# Patient Record
Sex: Male | Born: 1935 | Race: White | Hispanic: No | Smoking: Former smoker
Health system: Southern US, Community
[De-identification: ages and names within clinical notes are randomized; demographics above are authoritative.]

## PROBLEM LIST (undated history)

## (undated) DIAGNOSIS — I4891 Unspecified atrial fibrillation: Secondary | ICD-10-CM

## (undated) DIAGNOSIS — E1129 Type 2 diabetes mellitus with other diabetic kidney complication: Secondary | ICD-10-CM

## (undated) DIAGNOSIS — I10 Essential (primary) hypertension: Secondary | ICD-10-CM

## (undated) DIAGNOSIS — N183 Chronic kidney disease, stage 3 unspecified: Secondary | ICD-10-CM

## (undated) HISTORY — PX: REPLACEMENT TOTAL KNEE: SUR1224

## (undated) HISTORY — PX: APPENDECTOMY: SHX54

---

## 2003-09-02 ENCOUNTER — Other Ambulatory Visit: Payer: Self-pay

## 2005-11-16 ENCOUNTER — Ambulatory Visit: Payer: Self-pay | Admitting: Ophthalmology

## 2005-11-22 ENCOUNTER — Ambulatory Visit: Payer: Self-pay | Admitting: Ophthalmology

## 2005-12-29 ENCOUNTER — Ambulatory Visit: Payer: Self-pay | Admitting: Internal Medicine

## 2006-01-11 ENCOUNTER — Emergency Department: Payer: Self-pay | Admitting: Emergency Medicine

## 2006-01-11 ENCOUNTER — Other Ambulatory Visit: Payer: Self-pay

## 2006-01-15 ENCOUNTER — Ambulatory Visit: Payer: Self-pay | Admitting: Internal Medicine

## 2006-02-05 ENCOUNTER — Ambulatory Visit: Payer: Self-pay | Admitting: Internal Medicine

## 2010-06-13 ENCOUNTER — Encounter: Payer: Self-pay | Admitting: Orthopedic Surgery

## 2010-07-07 ENCOUNTER — Encounter: Payer: Self-pay | Admitting: Orthopedic Surgery

## 2011-06-21 ENCOUNTER — Ambulatory Visit: Payer: Self-pay | Admitting: Ophthalmology

## 2013-06-02 ENCOUNTER — Ambulatory Visit: Payer: Self-pay | Admitting: Internal Medicine

## 2019-05-12 ENCOUNTER — Inpatient Hospital Stay
Admission: EM | Admit: 2019-05-12 | Discharge: 2019-05-16 | DRG: 481 | Disposition: A | Discharge status: Skilled Nursing Facility | Payer: Medicare Other | Attending: Internal Medicine | Admitting: Internal Medicine

## 2019-05-12 ENCOUNTER — Other Ambulatory Visit: Payer: Self-pay

## 2019-05-12 ENCOUNTER — Emergency Department: Payer: Medicare Other

## 2019-05-12 ENCOUNTER — Encounter: Payer: Self-pay | Admitting: Internal Medicine

## 2019-05-12 DIAGNOSIS — M9712XA Periprosthetic fracture around internal prosthetic left knee joint, initial encounter: Secondary | ICD-10-CM | POA: Diagnosis present

## 2019-05-12 DIAGNOSIS — Z888 Allergy status to other drugs, medicaments and biological substances status: Secondary | ICD-10-CM

## 2019-05-12 DIAGNOSIS — I9589 Other hypotension: Secondary | ICD-10-CM | POA: Diagnosis not present

## 2019-05-12 DIAGNOSIS — E1121 Type 2 diabetes mellitus with diabetic nephropathy: Secondary | ICD-10-CM | POA: Diagnosis not present

## 2019-05-12 DIAGNOSIS — N1831 Chronic kidney disease, stage 3a: Secondary | ICD-10-CM | POA: Diagnosis present

## 2019-05-12 DIAGNOSIS — I10 Essential (primary) hypertension: Secondary | ICD-10-CM | POA: Diagnosis not present

## 2019-05-12 DIAGNOSIS — Z882 Allergy status to sulfonamides status: Secondary | ICD-10-CM

## 2019-05-12 DIAGNOSIS — Y9389 Activity, other specified: Secondary | ICD-10-CM | POA: Diagnosis not present

## 2019-05-12 DIAGNOSIS — D62 Acute posthemorrhagic anemia: Secondary | ICD-10-CM | POA: Diagnosis not present

## 2019-05-12 DIAGNOSIS — S72452A Displaced supracondylar fracture without intracondylar extension of lower end of left femur, initial encounter for closed fracture: Secondary | ICD-10-CM | POA: Diagnosis present

## 2019-05-12 DIAGNOSIS — N179 Acute kidney failure, unspecified: Secondary | ICD-10-CM | POA: Diagnosis not present

## 2019-05-12 DIAGNOSIS — I4891 Unspecified atrial fibrillation: Secondary | ICD-10-CM | POA: Diagnosis present

## 2019-05-12 DIAGNOSIS — Z7982 Long term (current) use of aspirin: Secondary | ICD-10-CM | POA: Diagnosis not present

## 2019-05-12 DIAGNOSIS — S72402A Unspecified fracture of lower end of left femur, initial encounter for closed fracture: Secondary | ICD-10-CM | POA: Diagnosis present

## 2019-05-12 DIAGNOSIS — E1122 Type 2 diabetes mellitus with diabetic chronic kidney disease: Secondary | ICD-10-CM | POA: Diagnosis present

## 2019-05-12 DIAGNOSIS — Z6832 Body mass index (BMI) 32.0-32.9, adult: Secondary | ICD-10-CM

## 2019-05-12 DIAGNOSIS — I482 Chronic atrial fibrillation, unspecified: Secondary | ICD-10-CM | POA: Diagnosis present

## 2019-05-12 DIAGNOSIS — G4733 Obstructive sleep apnea (adult) (pediatric): Secondary | ICD-10-CM | POA: Diagnosis present

## 2019-05-12 DIAGNOSIS — Z8673 Personal history of transient ischemic attack (TIA), and cerebral infarction without residual deficits: Secondary | ICD-10-CM | POA: Diagnosis not present

## 2019-05-12 DIAGNOSIS — I5022 Chronic systolic (congestive) heart failure: Secondary | ICD-10-CM | POA: Diagnosis not present

## 2019-05-12 DIAGNOSIS — Z79899 Other long term (current) drug therapy: Secondary | ICD-10-CM | POA: Diagnosis not present

## 2019-05-12 DIAGNOSIS — X501XXA Overexertion from prolonged static or awkward postures, initial encounter: Secondary | ICD-10-CM | POA: Diagnosis not present

## 2019-05-12 DIAGNOSIS — S72402D Unspecified fracture of lower end of left femur, subsequent encounter for closed fracture with routine healing: Secondary | ICD-10-CM | POA: Diagnosis not present

## 2019-05-12 DIAGNOSIS — Z8571 Personal history of Hodgkin lymphoma: Secondary | ICD-10-CM

## 2019-05-12 DIAGNOSIS — Z419 Encounter for procedure for purposes other than remedying health state, unspecified: Secondary | ICD-10-CM

## 2019-05-12 DIAGNOSIS — Z794 Long term (current) use of insulin: Secondary | ICD-10-CM

## 2019-05-12 DIAGNOSIS — E1129 Type 2 diabetes mellitus with other diabetic kidney complication: Secondary | ICD-10-CM | POA: Diagnosis present

## 2019-05-12 DIAGNOSIS — Z87891 Personal history of nicotine dependence: Secondary | ICD-10-CM

## 2019-05-12 DIAGNOSIS — N183 Chronic kidney disease, stage 3 unspecified: Secondary | ICD-10-CM | POA: Diagnosis present

## 2019-05-12 DIAGNOSIS — I130 Hypertensive heart and chronic kidney disease with heart failure and stage 1 through stage 4 chronic kidney disease, or unspecified chronic kidney disease: Secondary | ICD-10-CM | POA: Diagnosis present

## 2019-05-12 DIAGNOSIS — E669 Obesity, unspecified: Secondary | ICD-10-CM | POA: Diagnosis present

## 2019-05-12 DIAGNOSIS — E861 Hypovolemia: Secondary | ICD-10-CM | POA: Diagnosis not present

## 2019-05-12 DIAGNOSIS — Z7901 Long term (current) use of anticoagulants: Secondary | ICD-10-CM

## 2019-05-12 DIAGNOSIS — D72829 Elevated white blood cell count, unspecified: Secondary | ICD-10-CM | POA: Diagnosis present

## 2019-05-12 DIAGNOSIS — Z20822 Contact with and (suspected) exposure to covid-19: Secondary | ICD-10-CM | POA: Diagnosis present

## 2019-05-12 DIAGNOSIS — I2510 Atherosclerotic heart disease of native coronary artery without angina pectoris: Secondary | ICD-10-CM | POA: Diagnosis present

## 2019-05-12 DIAGNOSIS — Y92012 Bathroom of single-family (private) house as the place of occurrence of the external cause: Secondary | ICD-10-CM

## 2019-05-12 DIAGNOSIS — Z955 Presence of coronary angioplasty implant and graft: Secondary | ICD-10-CM

## 2019-05-12 DIAGNOSIS — I4821 Permanent atrial fibrillation: Secondary | ICD-10-CM | POA: Diagnosis not present

## 2019-05-12 HISTORY — DX: Chronic kidney disease, stage 3 unspecified: N18.30

## 2019-05-12 HISTORY — DX: Unspecified atrial fibrillation: I48.91

## 2019-05-12 HISTORY — DX: Type 2 diabetes mellitus with other diabetic kidney complication: E11.29

## 2019-05-12 HISTORY — DX: Essential (primary) hypertension: I10

## 2019-05-12 LAB — COMPREHENSIVE METABOLIC PANEL
ALT: 19 U/L (ref 0–44)
AST: 28 U/L (ref 15–41)
Albumin: 3.5 g/dL (ref 3.5–5.0)
Alkaline Phosphatase: 63 U/L (ref 38–126)
Anion gap: 13 (ref 5–15)
BUN: 41 mg/dL — ABNORMAL HIGH (ref 8–23)
CO2: 20 mmol/L — ABNORMAL LOW (ref 22–32)
Calcium: 9.4 mg/dL (ref 8.9–10.3)
Chloride: 105 mmol/L (ref 98–111)
Creatinine, Ser: 1.53 mg/dL — ABNORMAL HIGH (ref 0.61–1.24)
GFR calc Af Amer: 47 mL/min — ABNORMAL LOW (ref >60)
GFR calc non Af Amer: 40 mL/min — ABNORMAL LOW (ref >60)
Glucose, Bld: 206 mg/dL — ABNORMAL HIGH (ref 70–99)
Potassium: 4.4 mmol/L (ref 3.5–5.1)
Sodium: 138 mmol/L (ref 135–145)
Total Bilirubin: 1.3 mg/dL — ABNORMAL HIGH (ref 0.3–1.2)
Total Protein: 7.2 g/dL (ref 6.5–8.1)

## 2019-05-12 LAB — TYPE AND SCREEN
ABO/RH(D): A POS
Antibody Screen: NEGATIVE

## 2019-05-12 LAB — CBC WITH DIFFERENTIAL/PLATELET
Abs Immature Granulocytes: 0.12 10*3/uL — ABNORMAL HIGH (ref 0.00–0.07)
Basophils Absolute: 0.1 10*3/uL (ref 0.0–0.1)
Basophils Relative: 1 %
Eosinophils Absolute: 0 10*3/uL (ref 0.0–0.5)
Eosinophils Relative: 0 %
HCT: 38.7 % — ABNORMAL LOW (ref 39.0–52.0)
Hemoglobin: 13.5 g/dL (ref 13.0–17.0)
Immature Granulocytes: 1 %
Lymphocytes Relative: 5 %
Lymphs Abs: 0.8 10*3/uL (ref 0.7–4.0)
MCH: 32.8 pg (ref 26.0–34.0)
MCHC: 34.9 g/dL (ref 30.0–36.0)
MCV: 93.9 fL (ref 80.0–100.0)
Monocytes Absolute: 0.9 10*3/uL (ref 0.1–1.0)
Monocytes Relative: 5 %
Neutro Abs: 15.5 10*3/uL — ABNORMAL HIGH (ref 1.7–7.7)
Neutrophils Relative %: 88 %
Platelets: 178 10*3/uL (ref 150–400)
RBC: 4.12 MIL/uL — ABNORMAL LOW (ref 4.22–5.81)
RDW: 12.8 % (ref 11.5–15.5)
WBC: 17.4 10*3/uL — ABNORMAL HIGH (ref 4.0–10.5)
nRBC: 0 % (ref 0.0–0.2)

## 2019-05-12 LAB — RESPIRATORY PANEL BY RT PCR (FLU A&B, COVID)
Influenza A by PCR: NEGATIVE
Influenza B by PCR: NEGATIVE
SARS Coronavirus 2 by RT PCR: NEGATIVE

## 2019-05-12 LAB — PROTIME-INR
INR: 1.4 — ABNORMAL HIGH (ref 0.8–1.2)
Prothrombin Time: 16.8 seconds — ABNORMAL HIGH (ref 11.4–15.2)

## 2019-05-12 LAB — GLUCOSE, CAPILLARY
Glucose-Capillary: 198 mg/dL — ABNORMAL HIGH (ref 70–99)
Glucose-Capillary: 233 mg/dL — ABNORMAL HIGH (ref 70–99)

## 2019-05-12 LAB — APTT: aPTT: 31 seconds (ref 24–36)

## 2019-05-12 LAB — BRAIN NATRIURETIC PEPTIDE: B Natriuretic Peptide: 107 pg/mL — ABNORMAL HIGH (ref 0.0–100.0)

## 2019-05-12 MED ORDER — CEFAZOLIN SODIUM-DEXTROSE 2-4 GM/100ML-% IV SOLN
2.0000 g | Freq: Once | INTRAVENOUS | Status: AC
  Administered 2019-05-13: 13:00:00 2 g via INTRAVENOUS
  Filled 2019-05-12: qty 100

## 2019-05-12 MED ORDER — INSULIN ASPART 100 UNIT/ML ~~LOC~~ SOLN
0.0000 [IU] | Freq: Every day | SUBCUTANEOUS | Status: DC
  Administered 2019-05-12: 2 [IU] via SUBCUTANEOUS
  Filled 2019-05-12: qty 1

## 2019-05-12 MED ORDER — SENNOSIDES-DOCUSATE SODIUM 8.6-50 MG PO TABS: 1.0000 | ORAL_TABLET | Freq: Every evening | ORAL | Status: DC | PRN

## 2019-05-12 MED ORDER — ACETAMINOPHEN 325 MG PO TABS
650.0000 mg | ORAL_TABLET | Freq: Four times a day (QID) | ORAL | Status: DC | PRN
  Administered 2019-05-12: 650 mg via ORAL
  Filled 2019-05-12 (×2): qty 2

## 2019-05-12 MED ORDER — MORPHINE SULFATE (PF) 2 MG/ML IV SOLN: 0.5000 mg | INTRAVENOUS | Status: DC | PRN

## 2019-05-12 MED ORDER — ONDANSETRON HCL 4 MG/2ML IJ SOLN: 4.0000 mg | Freq: Three times a day (TID) | INTRAMUSCULAR | Status: DC | PRN

## 2019-05-12 MED ORDER — INSULIN ASPART 100 UNIT/ML ~~LOC~~ SOLN
0.0000 [IU] | Freq: Three times a day (TID) | SUBCUTANEOUS | Status: DC
  Administered 2019-05-12: 3 [IU] via SUBCUTANEOUS
  Administered 2019-05-13: 5 [IU] via SUBCUTANEOUS
  Filled 2019-05-12 (×2): qty 1

## 2019-05-12 MED ORDER — OXYCODONE-ACETAMINOPHEN 5-325 MG PO TABS: 1.0000 | ORAL_TABLET | ORAL | Status: DC | PRN

## 2019-05-12 MED ORDER — HYDRALAZINE HCL 25 MG PO TABS: 25.0000 mg | ORAL_TABLET | Freq: Three times a day (TID) | ORAL | Status: DC | PRN

## 2019-05-12 MED ORDER — METHOCARBAMOL 500 MG PO TABS
500.0000 mg | ORAL_TABLET | Freq: Three times a day (TID) | ORAL | Status: DC | PRN
  Filled 2019-05-12: qty 1

## 2019-05-12 NOTE — ED Provider Notes (Signed)
[facility] Emergency Department Provider Note  ____________________________________________   First MD Initiated Contact with Patient 05/12/19 [phone]     (approximate)  I have reviewed the triage vital signs and the nursing notes.   HISTORY  Chief Complaint Knee Pain   HPI Edward Strong is a 86 y.o. male presents to the ED via EMS with complaint of left leg pain.  Patient states he was unable to bear weight.  Patient states that he got his leg caught in a plastic chair and by the time he got his leg out from the plastic chair he was unable to bear weight.  Patient lives alone and normally uses a walker.  He denies any head injury or loss of consciousness.  Patient has had a total knee replacement in his left knee.  Currently he rates his pain as 4 out of 10.      Past Medical History:  Diagnosis Date  . Atrial fibrillation ([place])   . CKD (chronic kidney disease), stage IIIa   . HTN (hypertension)   . Type II diabetes mellitus with renal manifestations [facility])     Patient Active Problem List   Diagnosis Date Noted  . Closed fracture of left distal femur ([place]) 05/12/2019  . CAD (coronary artery disease) 05/12/2019  . Chronic systolic CHF (congestive heart failure) ([place]) 05/12/2019  . Type II diabetes mellitus with renal manifestations ([place])   . HTN (hypertension)   . CKD (chronic kidney disease), stage IIIa   . Atrial fibrillation ([place])     Prior to Admission medications   Medication Sig Start Date End Date Taking? Authorizing Provider  amLODipine (NORVASC) 10 MG tablet Take 10 mg by mouth daily.   Yes [provider]  apixaban (ELIQUIS) 2.5 MG TABS tablet Take 2.5 mg by mouth 2 (two) times daily.   Yes [provider]  aspirin 81 MG chewable tablet Chew 81 mg by mouth daily.   Yes [provider]  furosemide (LASIX) 40 MG tablet Take 40 mg by mouth.   Yes [provider]  insulin lispro (HUMALOG) 100 UNIT/ML  injection Inject into the skin 3 (three) times daily before meals.   Yes [provider]  insulin regular (NOVOLIN R) 100 units/mL injection Inject into the skin 3 (three) times daily before meals.   Yes [provider]    Allergies Bee venom, Ezetimibe, Fenofibrate micronized, Pravastatin, and Sulfa antibiotics  No family history on file.  Social History Social History   Tobacco Use  . Smoking status: Not on file  Substance Use Topics  . Alcohol use: Not on file  . Drug use: Not on file    Review of Systems Constitutional: No fever/chills Eyes: No visual changes. Cardiovascular: Denies chest pain.  Positive history hypertension, CAD and chronic A. fib. Respiratory: Denies shortness of breath. Gastrointestinal: No abdominal pain.  No nausea, no vomiting. Genitourinary: Negative for dysuria. Musculoskeletal: Positive for left knee pain. Skin: Negative for rash. Neurological: Negative for headaches, focal weakness or numbness. ____________________________________________   PHYSICAL EXAM:  VITAL SIGNS: ED Triage Vitals  Enc Vitals Group     BP 05/12/19 0843 113/88     Pulse Rate 05/12/19 0843 85     Resp 05/12/19 0843 20     Temp 05/12/19 0843 97.9 F (36.6 C)     Temp Source 05/12/19 0843 Oral     SpO2 05/12/19 0843 98 %     Weight 05/12/19 0844 230 lb (104.3 kg)  Height 05/12/19 0844 5\' 11"  (1.803 m)     Head Circumference --      Peak Flow --      Pain Score 05/12/19 0844 4     Pain Loc --      Pain Edu? --      Excl. in [place]? --    Constitutional: Alert and oriented. Well appearing and in no acute distress.  Answers questions appropriately. Eyes: Conjunctivae are normal.  Head: Atraumatic. Neck: No stridor.   Cardiovascular: Normal rate, regular rhythm. Grossly normal heart sounds.  Good peripheral circulation. Respiratory: Normal respiratory effort.  No retractions. Lungs CTAB. Gastrointestinal: Soft and nontender. No distention.  Bowel  sounds normoactive x4 quadrants. Musculoskeletal: Examination of the left knee there is no deformity appreciated however there is moderate soft tissue edema present.  Area anteriorly is moderately tender to palpation.  There is some superficial abrasions noted without any active bleeding or foreign body.  No point tenderness is appreciated on compression of the hips bilaterally and no point tenderness is elicited over the left hip area.  No tenderness is noted on palpation of the left anterior tib-fib or ankle area.  Patient is able move upper extremities with any difficulty. Neurologic:  Normal speech and language. No gross focal neurologic deficits are appreciated. No gait instability. Skin:  Skin is warm, dry.  There are superficial abrasions on the anterior portion of the left knee without active bleeding or foreign body. Psychiatric: Mood and affect are normal. Speech and behavior are normal.  ____________________________________________   LABS (all labs ordered are listed, but only abnormal results are displayed)  Labs Reviewed  GLUCOSE, CAPILLARY - Abnormal; Notable for the following components:      Result Value   Glucose-Capillary 198 (*)    All other components within normal limits  RESPIRATORY PANEL BY RT PCR (FLU A&B, COVID)  CBC WITH DIFFERENTIAL/PLATELET  COMPREHENSIVE METABOLIC PANEL  APTT  PROTIME-INR  BRAIN NATRIURETIC PEPTIDE  HEMOGLOBIN A1C  TYPE AND SCREEN   ____________________________________________  EKG  ____________________________________________  RADIOLOGY  Official radiology report(s): DG Chest Portable 1 View  Result Date: 05/12/2019 CLINICAL DATA:  Preoperative examination for patient with a distal left femur fracture suffered in a fall today. EXAM: PORTABLE CHEST 1 VIEW COMPARISON:  None. FINDINGS: There is volume loss in the right chest with suture material present. Calcified granulomata are seen in the right lung and a calcified right hilar lymph  node is also noted. Lungs are otherwise clear. No pneumothorax or pleural effusion. Heart size is normal. Atherosclerosis is seen. IMPRESSION: No acute disease. Atherosclerosis. Electronically Signed   By: Inge Rise M.D.   On: 05/12/2019 12:03   DG Knee Complete 4 Views Left  Result Date: 05/12/2019 CLINICAL DATA:  Left knee pain after falling at home today. EXAM: LEFT KNEE - COMPLETE 4+ VIEW COMPARISON:  None. FINDINGS: Status post total knee arthroplasty. There is a comminuted and moderately displaced fracture of the distal femur just proximal to the femoral component of the arthroplasty. This fracture demonstrates up to 2.1 cm of posterior and 4.3 cm of proximal displacement. No dislocation. The proximal tibia and fibula are intact. Diffuse vascular calcifications are noted. IMPRESSION: Comminuted and moderately displaced fracture of the distal femur just proximal to the femoral component of the total knee arthroplasty. Electronically Signed   By: Richardean Sale M.D.   On: 05/12/2019 10:45    ____________________________________________   PROCEDURES  Procedure(s) performed (including Critical Care):  Procedures  ____________________________________________   INITIAL IMPRESSION / ASSESSMENT AND PLAN / ED COURSE  As part of my medical decision making, I reviewed the following data within the electronic MEDICAL RECORD NUMBER Notes from prior ED visits and [place] Controlled Substance Database  86 year old male presents to the ED via EMS after he got his left knee and lower extremity caught in a plastic chair that he uses while in the bathroom.  Patient normally uses a walker to ambulate and lives at home alone.  He states after getting his leg untangled he was unable to bear weight.  He denies a fall.  Patient had to be lifted with a hoist to the bed as he was unable to bear weight from the wheelchair to transfer.  X-rays show a displaced fracture of the distal femur.  Dr. Roland Rack was called and  arrangements were made for admission.  Admission was also discussed with Dr. Blaine Hamper.  Family member was informed that the patient was being admitted for surgery.  ____________________________________________   FINAL CLINICAL IMPRESSION(S) / ED DIAGNOSES  Final diagnoses:  Closed fracture of distal end of left femur, unspecified fracture morphology, initial encounter [facility])     ED Discharge Orders    None       Note:  This document was prepared using Dragon voice recognition software and may include unintentional dictation errors.    Johnn Hai, PA-C 05/12/19 1225    Arta Silence, MD 05/12/19 [phone]

## 2019-05-12 NOTE — ED Notes (Signed)
Attempted report and put on hold for 10 minutes with no answer

## 2019-05-12 NOTE — ED Notes (Signed)
Attempted report with no answer from floor

## 2019-05-12 NOTE — Consult Note (Signed)
CARDIOLOGY CONSULT NOTE               Patient ID: Edward Strong MRN: [id number] DOB/AGE: 1929-08-21 86 y.o.  Admit date: 05/12/2019 Referring Physician Dr Blaine Hamper hospitalist Primary Physician none Primary Cardiologist Dr. Nehemiah Massed Reason for Consultation preop clearance atrial fibrillation  HPI: 86 year old white male lives alone multiple medical problems status post fall preop for knee surgery.  Patient history of atrial fibrillation on Eliquis his last dose was 1030pm on January 3.  Patient has history of hypertension hyperlipidemia diabetes previous CVA coronary disease PCI and stents congestive heart failure Hodgkin's lymphoma obstructive sleep apnea chronic renal insufficiency.  Patient states to be doing reasonably well patient suffered a distal femur fracture comminuted preop for surgery with Dr. Roland Rack tomorrow.  Patient denies any chest pain shortness of breath blackout spells or syncope.  Patient feels reasonably well otherwise denies any palpitations tachycardia.  Review of systems complete and found to be negative unless listed above     Past Medical History:  Diagnosis Date  . Atrial fibrillation ([place])   . CKD (chronic kidney disease), stage IIIa   . HTN (hypertension)   . Type II diabetes mellitus with renal manifestations [facility])     Past Surgical History:  Procedure Laterality Date  . APPENDECTOMY    . REPLACEMENT TOTAL KNEE Left     Medications Prior to Admission  Medication Sig Dispense Refill Last Dose  . apixaban (ELIQUIS) 2.5 MG TABS tablet Take 2.5 mg by mouth 2 (two) times daily.   05/11/2019 at 2000  . aspirin 81 MG chewable tablet Chew 81 mg by mouth daily.   05/11/2019 at 0800  . furosemide (LASIX) 40 MG tablet Take 40 mg by mouth.   05/11/2019 at 0800  . insulin NPH Human (NOVOLIN N) 100 UNIT/ML injection Inject 50 Units into the skin every evening.   05/11/2019 at 2000  . insulin regular (NOVOLIN R) 100 units/mL injection Inject 20 Units into the skin 3 (three)  times daily before meals.   05/11/2019 at 1800  . losartan (COZAAR) 100 MG tablet Take 100 mg by mouth daily.   05/11/2019 at 0800   Social History   Socioeconomic History  . Marital status: Widowed    Spouse name: Not on file  . Number of children: Not on file  . Years of education: Not on file  . Highest education level: Not on file  Occupational History  . Not on file  Tobacco Use  . Smoking status: Former Research scientist (life sciences)  . Smokeless tobacco: Never Used  Substance and Sexual Activity  . Alcohol use: Never  . Drug use: Not Currently  . Sexual activity: Not on file  Other Topics Concern  . Not on file  Social History Narrative  . Not on file   Social Determinants of Health   Financial Resource Strain:   . Difficulty of Paying Living Expenses: Not on file  Food Insecurity:   . Worried About Charity fundraiser in the Last Year: Not on file  . Ran Out of Food in the Last Year: Not on file  Transportation Needs:   . Lack of Transportation (Medical): Not on file  . Lack of Transportation (Non-Medical): Not on file  Physical Activity:   . Days of Exercise per Week: Not on file  . Minutes of Exercise per Session: Not on file  Stress:   . Feeling of Stress : Not on file  Social Connections:   . Frequency of Communication  with Friends and Family: Not on file  . Frequency of Social Gatherings with Friends and Family: Not on file  . Attends Religious Services: Not on file  . Active Member of Clubs or Organizations: Not on file  . Attends Archivist Meetings: Not on file  . Marital Status: Not on file  Intimate Partner Violence:   . Fear of Current or Ex-Partner: Not on file  . Emotionally Abused: Not on file  . Physically Abused: Not on file  . Sexually Abused: Not on file    No family history on file.    Review of systems complete and found to be negative unless listed above      PHYSICAL EXAM  General: Well developed, well nourished, in no acute distress HEENT:   Normocephalic and atramatic Neck:  No JVD.  Lungs: Clear bilaterally to auscultation and percussion. Heart: Irregular irregular. Normal S1 and S2 without gallops or murmurs.  Abdomen: Bowel sounds are positive, abdomen soft and non-tender  Msk:  Back normal, normal gait. Normal strength and tone for age. Extremities: No clubbing, cyanosis or edema left knee immobilized.   Neuro: Alert and oriented X 3. Psych:  Good affect, responds appropriately  Labs:   Lab Results  Component Value Date   WBC 17.4 (H) 05/12/2019   HGB 13.5 05/12/2019   HCT 38.7 (L) 05/12/2019   MCV 93.9 05/12/2019   PLT 178 05/12/2019    Recent Labs  Lab 05/12/19 1222  NA 138  K 4.4  CL 105  CO2 20*  BUN 41*  CREATININE 1.53*  CALCIUM 9.4  PROT 7.2  BILITOT 1.3*  ALKPHOS 63  ALT 19  AST 28  GLUCOSE 206*   No results found for: CKTOTAL, CKMB, CKMBINDEX, TROPONINI No results found for: CHOL No results found for: HDL No results found for: LDLCALC No results found for: TRIG No results found for: CHOLHDL No results found for: LDLDIRECT    Radiology: DG Chest Portable 1 View  Result Date: 05/12/2019 CLINICAL DATA:  Preoperative examination for patient with a distal left femur fracture suffered in a fall today. EXAM: PORTABLE CHEST 1 VIEW COMPARISON:  None. FINDINGS: There is volume loss in the right chest with suture material present. Calcified granulomata are seen in the right lung and a calcified right hilar lymph node is also noted. Lungs are otherwise clear. No pneumothorax or pleural effusion. Heart size is normal. Atherosclerosis is seen. IMPRESSION: No acute disease. Atherosclerosis. Electronically Signed   By: Inge Rise M.D.   On: 05/12/2019 12:03   DG Knee Complete 4 Views Left  Result Date: 05/12/2019 CLINICAL DATA:  Left knee pain after falling at home today. EXAM: LEFT KNEE - COMPLETE 4+ VIEW COMPARISON:  None. FINDINGS: Status post total knee arthroplasty. There is a comminuted and  moderately displaced fracture of the distal femur just proximal to the femoral component of the arthroplasty. This fracture demonstrates up to 2.1 cm of posterior and 4.3 cm of proximal displacement. No dislocation. The proximal tibia and fibula are intact. Diffuse vascular calcifications are noted. IMPRESSION: Comminuted and moderately displaced fracture of the distal femur just proximal to the femoral component of the total knee arthroplasty. Electronically Signed   By: Richardean Sale M.D.   On: 05/12/2019 10:45    EKG: Atrial fibrillation rate of around 70 nonspecific ST-T wave changes  ASSESSMENT AND PLAN:  Preop for knee surgery Atrial fibrillation Hypertension Obstructive sleep apnea Diabetes Hyperlipidemia Coronary artery disease History of PCI and  stent Chronic renal insufficiency Elevated white count possibly related to CLL . Plan Patient appears to be an mild to moderate acceptable risk for orthopedic surgery Continue to hold Eliquis last dose was 22:30 May 11, 2019 Continue hypertension management control with amlodipine Agree with diabetes control Humalog Novolin insulin R Maintain adequate hydration I do not recommend any further testing to help mitigate his risk I will continue to follow while in hospital pre and postop  Signed: Yolonda Kida MD, PHD, [facility] 05/12/2019, 5:04 PM

## 2019-05-12 NOTE — H&P (Signed)
History and Physical    EBAN BONKOWSKI [id number] DOB: [birth year] DOA: 05/12/2019  Referring MD/NP/PA:   PCP: Patient, No Pcp Per   Patient coming from:  The patient is coming from home.  At baseline, pt is independent for most of ADL.        Chief Complaint: left knee pain  HPI: JAKWAN MINDEL is a 86 y.o. male with medical history significant of hypertension, hyperlipidemia, diabetes mellitus, stroke, CAD, stent placement, dCHF, Hodgkin's lymphoma, OSA, atrial fibrillation on Eliquis, CKD stage III, left knee replacement, who presents with left knee pain.  Pt states that he got his leg caught in a plastic chair, twisted his leg. By the time he got his leg out from the plastic chair he was unable to bear weight due to left knee pain. The pain is constant, moderate, sharp, nonradiating.  No fall. He denies any head injury or loss of consciousness.    Patient does not have chest pain, shortness breath, cough.  No nausea vomiting, diarrhea, abdominal pain, symptoms of UTI or unilateral weakness.  ED Course: pt was found to have WBC 17.4, renal function close to baseline temperature normal, blood pressure 113/88, heart rate 85, oxygen saturation 92% on room air, pending repeat for COVID-19 test.  X-ray of left knee showed comminuted displaced distal femur fracture.  Patient is admitted to [facility] bed as inpatient.  Dr. Roland Rack of ortho was consulted.   Review of Systems:   General: no fevers, chills, no body weight gain, has fatigue HEENT: no blurry vision, hearing changes or sore throat Respiratory: no dyspnea, coughing, wheezing CV: no chest pain, no palpitations GI: no nausea, vomiting, abdominal pain, diarrhea, constipation GU: no dysuria, burning on urination, increased urinary frequency, hematuria  Ext: no leg edema Neuro: no unilateral weakness, numbness, or tingling, no vision change or hearing loss Skin: no rash, no skin tear. MSK: left knee pain. Heme: No easy bruising.  Travel  history: No recent long distant travel.  Allergy:  Allergies  Allergen Reactions  . Bee Venom Other (See Comments)  . Ezetimibe Other (See Comments)  . Fenofibrate Micronized Other (See Comments)  . Pravastatin Other (See Comments)  . Sulfa Antibiotics Other (See Comments)    Past Medical History:  Diagnosis Date  . Atrial fibrillation ([place])   . CKD (chronic kidney disease), stage IIIa   . HTN (hypertension)   . Type II diabetes mellitus with renal manifestations [facility])     Past Surgical History:  Procedure Laterality Date  . APPENDECTOMY    . REPLACEMENT TOTAL KNEE Left     Social History:  reports that he has quit smoking. He has never used smokeless tobacco. He reports previous drug use. He reports that he does not drink alcohol.  Family History: No family history on file.  Tried to have reviewed with patient, patient cannot provide clear family medical history.  Prior to Admission medications   Medication Sig Start Date End Date Taking? Authorizing Provider  amLODipine (NORVASC) 10 MG tablet Take 10 mg by mouth daily.   Yes [provider]  apixaban (ELIQUIS) 2.5 MG TABS tablet Take 2.5 mg by mouth 2 (two) times daily.   Yes [provider]  aspirin 81 MG chewable tablet Chew 81 mg by mouth daily.   Yes [provider]  furosemide (LASIX) 40 MG tablet Take 40 mg by mouth.   Yes [provider]  insulin lispro (HUMALOG) 100 UNIT/ML injection Inject into the skin 3 (three)  times daily before meals.   Yes [provider]  insulin regular (NOVOLIN R) 100 units/mL injection Inject into the skin 3 (three) times daily before meals.   Yes [provider]    Physical Exam: Vitals:   05/12/19 [id number] 05/12/19 0844 05/12/19 1507 05/12/19 1718  BP: 113/88  120/80 119/79  Pulse: 85  80 82  Resp: 20  20   Temp: 97.9 F (36.6 C)   97.7 F (36.5 C)  TempSrc: Oral   Oral  SpO2: 98%  99% 96%  Weight:  104.3 kg    Height:  5\' 11"   (1.803 m)     General: Not in acute distress HEENT:       Eyes: PERRL, EOMI, no scleral icterus.       ENT: No discharge from the ears and nose, no pharynx injection, no tonsillar enlargement.        Neck: No JVD, no bruit, no mass felt. Heme: No neck lymph node enlargement. Cardiac: S1/S2, RRR, No murmurs, No gallops or rubs. Respiratory:  No rales, wheezing, rhonchi or rubs. GI: Soft, nondistended, nontender, no rebound pain, no organomegaly, BS present. GU: No hematuria Ext: No pitting leg edema bilaterally. 2+DP/PT pulse bilaterally. Musculoskeletal: has left knee tenderness. Skin: No rashes.  Neuro: Alert, oriented X3, cranial nerves II-XII grossly intact, moves all extremities. Psych: Patient is not psychotic, no suicidal or hemocidal ideation.  Labs on Admission: I have personally reviewed following labs and imaging studies  CBC: Recent Labs  Lab 05/12/19 1222  WBC 17.4*  NEUTROABS 15.5*  HGB 13.5  HCT 38.7*  MCV 93.9  PLT 0000000   Basic Metabolic Panel: Recent Labs  Lab 05/12/19 1222  NA 138  K 4.4  CL 105  CO2 20*  GLUCOSE 206*  BUN 41*  CREATININE 1.53*  CALCIUM 9.4   GFR: Estimated Creatinine Clearance: 41.8 mL/min (A) (by C-G formula based on SCr of 1.53 mg/dL (H)). Liver Function Tests: Recent Labs  Lab 05/12/19 1222  AST 28  ALT 19  ALKPHOS 63  BILITOT 1.3*  PROT 7.2  ALBUMIN 3.5   No results for input(s): LIPASE, AMYLASE in the last 168 hours. No results for input(s): AMMONIA in the last 168 hours. Coagulation Profile: Recent Labs  Lab 05/12/19 1222  INR 1.4*   Cardiac Enzymes: No results for input(s): CKTOTAL, CKMB, CKMBINDEX, TROPONINI in the last 168 hours. BNP (last 3 results) No results for input(s): PROBNP in the last 8760 hours. HbA1C: No results for input(s): HGBA1C in the last 72 hours. CBG: Recent Labs  Lab 05/12/19 1007  GLUCAP 198*   Lipid Profile: No results for input(s): CHOL, HDL, LDLCALC, TRIG, CHOLHDL,  LDLDIRECT in the last 72 hours. Thyroid Function Tests: No results for input(s): TSH, T4TOTAL, FREET4, T3FREE, THYROIDAB in the last 72 hours. Anemia Panel: No results for input(s): VITAMINB12, FOLATE, FERRITIN, TIBC, IRON, RETICCTPCT in the last 72 hours. Urine analysis: No results found for: COLORURINE, APPEARANCEUR, LABSPEC, PHURINE, GLUCOSEU, HGBUR, BILIRUBINUR, KETONESUR, PROTEINUR, UROBILINOGEN, NITRITE, LEUKOCYTESUR Sepsis Labs: @LABRCNTIP (procalcitonin:4,lacticidven:4) ) Recent Results (from the past 240 hour(s))  Respiratory Panel by RT PCR (Flu A&B, Covid) - Nasopharyngeal Swab     Status: None   Collection Time: 05/12/19 12:23 PM   Specimen: Nasopharyngeal Swab  Result Value Ref Range Status   SARS Coronavirus 2 by RT PCR NEGATIVE NEGATIVE Final    Comment: (NOTE) SARS-CoV-2 target nucleic acids are NOT DETECTED. The SARS-CoV-2 RNA is generally detectable in upper respiratoy specimens during  the acute phase of infection. The lowest concentration of SARS-CoV-2 viral copies this assay can detect is 131 copies/mL. A negative result does not preclude SARS-Cov-2 infection and should not be used as the sole basis for treatment or other patient management decisions. A negative result may occur with  improper specimen collection/handling, submission of specimen other than nasopharyngeal swab, presence of viral mutation(s) within the areas targeted by this assay, and inadequate number of viral copies (<131 copies/mL). A negative result must be combined with clinical observations, patient history, and epidemiological information. The expected result is Negative. Fact Sheet for Patients:  PinkCheek.be Fact Sheet for Healthcare Providers:  GravelBags.it This test is not yet ap proved or cleared by the [place] FDA and  has been authorized for detection and/or diagnosis of SARS-CoV-2 by FDA under an Emergency Use  Authorization (EUA). This EUA will remain  in effect (meaning this test can be used) for the duration of the COVID-19 declaration under Section 564(b)(1) of the Act, 21 U.S.C. section 360bbb-3(b)(1), unless the authorization is terminated or revoked sooner.    Influenza A by PCR NEGATIVE NEGATIVE Final   Influenza B by PCR NEGATIVE NEGATIVE Final    Comment: (NOTE) The Xpert Xpress SARS-CoV-2/FLU/RSV assay is intended as an aid in  the diagnosis of influenza from Nasopharyngeal swab specimens and  should not be used as a sole basis for treatment. Nasal washings and  aspirates are unacceptable for Xpert Xpress SARS-CoV-2/FLU/RSV  testing. Fact Sheet for Patients: PinkCheek.be Fact Sheet for Healthcare Providers: GravelBags.it This test is not yet approved or cleared by the [place] FDA and  has been authorized for detection and/or diagnosis of SARS-CoV-2 by  FDA under an Emergency Use Authorization (EUA). This EUA will remain  in effect (meaning this test can be used) for the duration of the  Covid-19 declaration under Section 564(b)(1) of the Act, 21  U.S.C. section 360bbb-3(b)(1), unless the authorization is  terminated or revoked. Performed at [facility], [address] [place], [place] [address]      Radiological Exams on Admission: DG Chest Portable 1 View  Result Date: 05/12/2019 CLINICAL DATA:  Preoperative examination for patient with a distal left femur fracture suffered in a fall today. EXAM: PORTABLE CHEST 1 VIEW COMPARISON:  None. FINDINGS: There is volume loss in the right chest with suture material present. Calcified granulomata are seen in the right lung and a calcified right hilar lymph node is also noted. Lungs are otherwise clear. No pneumothorax or pleural effusion. Heart size is normal. Atherosclerosis is seen. IMPRESSION: No acute disease. Atherosclerosis. Electronically Signed   By: Inge Rise M.D.   On: 05/12/2019 12:03   DG Knee Complete 4 Views Left  Result Date: 05/12/2019 CLINICAL DATA:  Left knee pain after falling at home today. EXAM: LEFT KNEE - COMPLETE 4+ VIEW COMPARISON:  None. FINDINGS: Status post total knee arthroplasty. There is a comminuted and moderately displaced fracture of the distal femur just proximal to the femoral component of the arthroplasty. This fracture demonstrates up to 2.1 cm of posterior and 4.3 cm of proximal displacement. No dislocation. The proximal tibia and fibula are intact. Diffuse vascular calcifications are noted. IMPRESSION: Comminuted and moderately displaced fracture of the distal femur just proximal to the femoral component of the total knee arthroplasty. Electronically Signed   By: Richardean Sale M.D.   On: 05/12/2019 10:45     EKG: Reviewed independently, atrial fibrillation, QTc 474, low voltage, LAD.   Assessment/Plan  Principal Problem:   Closed fracture of left distal femur (HCC) Active Problems:   Type II diabetes mellitus with renal manifestations (HCC)   HTN (hypertension)   CKD (chronic kidney disease), stage IIIa   Atrial fibrillation (HCC)   CAD (coronary artery disease)   Chronic systolic CHF (congestive heart failure) (HCC)   Leukocytosis   Closed fracture of left distal femur ([place]): As evidenced by x-ray. Patient has moderate pain now. No neurovascular compromise. Orthopedic surgeon, Dr. Roland Rack was consulted.   - will admit to Med-surg bed - Pain control: morphine prn and percocet - When necessary Zofran for nausea - Robaxin for muscle spasm - Appreciated Dr. consultation - type and cross - INR/PTT -PT/OT when able to (not ordered now) - message was sent to Dr. Clayborn Bigness of card for presurgical clearance given old age and significant cardiac history.  Leukocytosis: WBC 17.4  Likely due to stress-induced demargination. Patient does not have signs of infection. -Follow-up CBC  Type II diabetes mellitus  with renal manifestations [facility]): Last A1c 7.1, poorly controled. Patient is taking NPH insulin and Novolin at home -NPH insulin 30 units daily -SSI  HTN:  -Continue home medications: Cozaar and Lasix -hydralazine prn  CKD (chronic kidney disease), stage IIIa: Renal function close to baseline.  Baseline creatinine 1.3-1.5.  His creatinine is 1.53, BUN 41 -f/u by BMP  Atrial fibrillation [facility]): HR 85 -hold Eliquis for surgery -tele monitoring  CAD (coronary artery disease): s/p stent. No CP -on ASA  Chronic systolic CHF (congestive heart failure) ([place]): stable.  2D echo on 05/21/2015 showed a EF >55%. No Leg edema. -continue home lasix    DVT ppx:SCD Code Status: Full code Family Communication: None at bed side.   Disposition Plan:  Anticipate discharge back to previous home environment Consults called:  Dr. Roland Rack of ortho Admission status: Med-surg bed as inpt        Date of Service 05/12/2019    Ivor Costa [facility]   If 7PM-7AM, please contact night-coverage [email or web address] Password [email or web address] 05/12/2019, 5:59 PM

## 2019-05-12 NOTE — ED Notes (Signed)
See triage note   Presents via ems with left knee pain  Unable to bear wt  Denies any fall  States he went ot move his knee and felt pain

## 2019-05-12 NOTE — ED Triage Notes (Signed)
Pt from home via ems- states that his left leg got twisted up this am while going to the bathroom and since then has been having left knee pain, hx of replacement. Pt lives alone and usually ambulates with walker.

## 2019-05-12 NOTE — Consult Note (Signed)
ORTHOPAEDIC CONSULTATION  REQUESTING PHYSICIAN: Ivor Costa, MD  Chief Complaint:   Left knee pain.  History of Present Illness: Edward Strong is a 86 y.o. male with a history of type 2 diabetes, chronic renal insufficiency, chronic atrial fibrillation, and hypertension who lives independently, and ambulates with a walker.  Apparently, the patient was in his usual state of health this morning when he apparently got his foot caught up in the leg of a chair.  By the time he was able to disentangle his leg, he was unable to bear weight on the leg.  He was brought to the emergency room by EMS where x-rays of the left leg confirmed the presence of a supracondylar left distal femur fracture.  The patient is status post a left total knee arthroplasty which was performed at [facility] approximately 12 to 14 years ago.  The patient had been doing well following this procedure until his recent injury.  The patient denies any associated injury.  He did not strike his head or lose consciousness.  He also denies any lightheadedness, dizziness, chest pain, shortness of breath, or other symptoms which may have precipitated his fall.  Past Medical History:  Diagnosis Date  . Atrial fibrillation ([place])   . CKD (chronic kidney disease), stage IIIa   . HTN (hypertension)   . Type II diabetes mellitus with renal manifestations [facility])    Past Surgical History:  Procedure Laterality Date  . APPENDECTOMY    . REPLACEMENT TOTAL KNEE Left    Social History   Socioeconomic History  . Marital status: Widowed    Spouse name: Not on file  . Number of children: Not on file  . Years of education: Not on file  . Highest education level: Not on file  Occupational History  . Not on file  Tobacco Use  . Smoking status: Former Research scientist (life sciences)  . Smokeless tobacco: Never Used  Substance and Sexual Activity  . Alcohol use: Never  . Drug use: Not Currently  . Sexual  activity: Not on file  Other Topics Concern  . Not on file  Social History Narrative  . Not on file   Social Determinants of Health   Financial Resource Strain:   . Difficulty of Paying Living Expenses: Not on file  Food Insecurity:   . Worried About Charity fundraiser in the Last Year: Not on file  . Ran Out of Food in the Last Year: Not on file  Transportation Needs:   . Lack of Transportation (Medical): Not on file  . Lack of Transportation (Non-Medical): Not on file  Physical Activity:   . Days of Exercise per Week: Not on file  . Minutes of Exercise per Session: Not on file  Stress:   . Feeling of Stress : Not on file  Social Connections:   . Frequency of Communication with Friends and Family: Not on file  . Frequency of Social Gatherings with Friends and Family: Not on file  . Attends Religious Services: Not on file  . Active Member of Clubs or Organizations: Not on file  . Attends Archivist Meetings: Not on file  . Marital Status: Not on file   No family history on file. Allergies  Allergen Reactions  . Bee Venom Other (See Comments)  . Ezetimibe Other (See Comments)  . Fenofibrate Micronized Other (See Comments)  . Pravastatin Other (See Comments)  . Sulfa Antibiotics Other (See Comments)   Prior to Admission medications   Medication Sig Start  Date End Date Taking? Authorizing Provider  amLODipine (NORVASC) 10 MG tablet Take 10 mg by mouth daily.   Yes [provider]  apixaban (ELIQUIS) 2.5 MG TABS tablet Take 2.5 mg by mouth 2 (two) times daily.   Yes [provider]  aspirin 81 MG chewable tablet Chew 81 mg by mouth daily.   Yes [provider]  furosemide (LASIX) 40 MG tablet Take 40 mg by mouth.   Yes [provider]  insulin lispro (HUMALOG) 100 UNIT/ML injection Inject into the skin 3 (three) times daily before meals.   Yes [provider]  insulin regular (NOVOLIN R) 100 units/mL injection Inject  into the skin 3 (three) times daily before meals.   Yes [provider]   DG Chest Portable 1 View  Result Date: 05/12/2019 CLINICAL DATA:  Preoperative examination for patient with a distal left femur fracture suffered in a fall today. EXAM: PORTABLE CHEST 1 VIEW COMPARISON:  None. FINDINGS: There is volume loss in the right chest with suture material present. Calcified granulomata are seen in the right lung and a calcified right hilar lymph node is also noted. Lungs are otherwise clear. No pneumothorax or pleural effusion. Heart size is normal. Atherosclerosis is seen. IMPRESSION: No acute disease. Atherosclerosis. Electronically Signed   By: Inge Rise M.D.   On: 05/12/2019 12:03   DG Knee Complete 4 Views Left  Result Date: 05/12/2019 CLINICAL DATA:  Left knee pain after falling at home today. EXAM: LEFT KNEE - COMPLETE 4+ VIEW COMPARISON:  None. FINDINGS: Status post total knee arthroplasty. There is a comminuted and moderately displaced fracture of the distal femur just proximal to the femoral component of the arthroplasty. This fracture demonstrates up to 2.1 cm of posterior and 4.3 cm of proximal displacement. No dislocation. The proximal tibia and fibula are intact. Diffuse vascular calcifications are noted. IMPRESSION: Comminuted and moderately displaced fracture of the distal femur just proximal to the femoral component of the total knee arthroplasty. Electronically Signed   By: Richardean Sale M.D.   On: 05/12/2019 10:45    Positive ROS: All other systems have been reviewed and were otherwise negative with the exception of those mentioned in the HPI and as above.  Physical Exam: General:  Alert, no acute distress Psychiatric:  Patient is competent for consent with normal mood and affect   Cardiovascular:  No pedal edema Respiratory:  No wheezing, non-labored breathing GI:  Abdomen is soft and non-tender Skin:  No lesions in the area of chief complaint Neurologic:   Sensation intact distally Lymphatic:  No axillary or cervical lymphadenopathy  Orthopedic Exam:  Orthopedic examination is limited to the left knee and lower extremity.  There is a well-healed surgical incision over the anterior aspect of the knee which shows no evidence for infection.  Moderate swelling is noted around the knee area extending into the distal thigh region, but no erythema, ecchymosis, abrasions, or other skin abnormalities are identified.  He has tenderness to palpation over the distal thigh region, and has more severe pain with any attempted active or passive motion of the leg or knee.  He is able to actively dorsiflex and plantarflex his toes and ankle.  Sensations intact to light touch to all distributions.  He has good capillary refill to his left foot.  X-rays:  Recent x-rays of the left knee are available for review and have been reviewed by myself.  These films demonstrate a oblique supracondylar femur fracture beginning just proximal to  the femoral component of the total knee arthroplasty and and extending proximally and posteriorly.  The total knee components appear to be well-positioned and without evidence of loosening.  There does not appear to be any intra-articular extension.  Assessment: Closed displaced periprosthetic supracondylar left femur fracture.  Plan: The treatment options have been discussed with the patient, including both surgical and nonsurgical choices.  The patient would like to proceed with surgical intervention to include an open reduction and internal fixation of the displaced periprosthetic left supracondylar femur fracture.  This procedure has been discussed in detail, as have the potential risks (including bleeding, infection, nerve and/or blood vessel injury, persistent or recurrent pain, malunion and/or nonunion, stiffness of the knee, need for further surgery, blood clots, strokes, heart attacks and/or arrhythmias, etc.) and benefits.  The patient  states his understanding and wishes to proceed.  A formal written consent will be obtained by the nursing staff.  A similar conversation also was conducted by telephone with the patient's son, [name], who is also in agreement with this plan.  Thank you for asking me to participate in the care of this most pleasant yet unfortunate man.  I will be happy to follow him with you.   Pascal Lux, MD  Beeper #:  [phone]  05/12/2019 12:55 PM

## 2019-05-13 ENCOUNTER — Inpatient Hospital Stay: Payer: Medicare Other | Admitting: Anesthesiology

## 2019-05-13 ENCOUNTER — Encounter: Payer: Self-pay | Admitting: Internal Medicine

## 2019-05-13 ENCOUNTER — Encounter
Admission: EM | Disposition: A | Discharge status: Skilled Nursing Facility | Payer: Self-pay | Source: Home / Self Care | Attending: Internal Medicine

## 2019-05-13 ENCOUNTER — Other Ambulatory Visit: Payer: Self-pay

## 2019-05-13 ENCOUNTER — Inpatient Hospital Stay: Payer: Medicare Other

## 2019-05-13 HISTORY — PX: ORIF FEMUR FRACTURE: SHX2119

## 2019-05-13 LAB — URINALYSIS, ROUTINE W REFLEX MICROSCOPIC
Bilirubin Urine: NEGATIVE
Glucose, UA: NEGATIVE mg/dL
Hgb urine dipstick: NEGATIVE
Ketones, ur: 5 mg/dL — AB
Nitrite: NEGATIVE
Protein, ur: 100 mg/dL — AB
Specific Gravity, Urine: 1.018 (ref 1.005–1.030)
Squamous Epithelial / HPF: NONE SEEN (ref 0–5)
pH: 7 (ref 5.0–8.0)

## 2019-05-13 LAB — GLUCOSE, CAPILLARY
Glucose-Capillary: 198 mg/dL — ABNORMAL HIGH (ref 70–99)
Glucose-Capillary: 204 mg/dL — ABNORMAL HIGH (ref 70–99)
Glucose-Capillary: 222 mg/dL — ABNORMAL HIGH (ref 70–99)
Glucose-Capillary: 223 mg/dL — ABNORMAL HIGH (ref 70–99)
Glucose-Capillary: 235 mg/dL — ABNORMAL HIGH (ref 70–99)
Glucose-Capillary: 243 mg/dL — ABNORMAL HIGH (ref 70–99)
Glucose-Capillary: 258 mg/dL — ABNORMAL HIGH (ref 70–99)
Glucose-Capillary: 300 mg/dL — ABNORMAL HIGH (ref 70–99)

## 2019-05-13 LAB — HEMOGLOBIN A1C
Hgb A1c MFr Bld: 7.8 % — ABNORMAL HIGH (ref 4.8–5.6)
Mean Plasma Glucose: 177 mg/dL

## 2019-05-13 SURGERY — OPEN REDUCTION INTERNAL FIXATION (ORIF) DISTAL FEMUR FRACTURE
Anesthesia: General | Laterality: Left

## 2019-05-13 MED ORDER — CEFAZOLIN SODIUM-DEXTROSE 2-4 GM/100ML-% IV SOLN
2.0000 g | Freq: Four times a day (QID) | INTRAVENOUS | Status: AC
  Administered 2019-05-13 – 2019-05-14 (×3): 2 g via INTRAVENOUS
  Filled 2019-05-13 (×3): qty 100

## 2019-05-13 MED ORDER — ONDANSETRON HCL 4 MG PO TABS: 4.0000 mg | ORAL_TABLET | Freq: Four times a day (QID) | ORAL | Status: DC | PRN

## 2019-05-13 MED ORDER — EPINEPHRINE PF 1 MG/ML IJ SOLN
INTRAMUSCULAR | Status: AC
  Filled 2019-05-13: qty 1

## 2019-05-13 MED ORDER — MORPHINE SULFATE (PF) 2 MG/ML IV SOLN: 2.0000 mg | INTRAVENOUS | Status: DC | PRN

## 2019-05-13 MED ORDER — ACETAMINOPHEN 10 MG/ML IV SOLN
INTRAVENOUS | Status: DC | PRN
  Administered 2019-05-13: 1000 mg via INTRAVENOUS

## 2019-05-13 MED ORDER — CEFAZOLIN SODIUM-DEXTROSE 2-4 GM/100ML-% IV SOLN
INTRAVENOUS | Status: AC
  Filled 2019-05-13: qty 100

## 2019-05-13 MED ORDER — BUPIVACAINE HCL (PF) 0.5 % IJ SOLN
INTRAMUSCULAR | Status: AC
  Filled 2019-05-13: qty 30

## 2019-05-13 MED ORDER — DIPHENHYDRAMINE HCL 12.5 MG/5ML PO ELIX: 12.5000 mg | ORAL_SOLUTION | ORAL | Status: DC | PRN

## 2019-05-13 MED ORDER — ONDANSETRON HCL 4 MG/2ML IJ SOLN
INTRAMUSCULAR | Status: DC | PRN
  Administered 2019-05-13: 4 mg via INTRAVENOUS

## 2019-05-13 MED ORDER — FENTANYL CITRATE (PF) 100 MCG/2ML IJ SOLN: 25.0000 ug | INTRAMUSCULAR | Status: DC | PRN

## 2019-05-13 MED ORDER — ENSURE MAX PROTEIN PO LIQD
11.0000 [oz_av] | Freq: Two times a day (BID) | ORAL | Status: DC
  Administered 2019-05-14 – 2019-05-15 (×3): 11 [oz_av] via ORAL
  Filled 2019-05-13: qty 330

## 2019-05-13 MED ORDER — NEOMYCIN-POLYMYXIN B GU 40-200000 IR SOLN
Status: DC | PRN
  Administered 2019-05-13: 2 mL

## 2019-05-13 MED ORDER — ONDANSETRON HCL 4 MG/2ML IJ SOLN
INTRAMUSCULAR | Status: AC
  Filled 2019-05-13: qty 2

## 2019-05-13 MED ORDER — METOCLOPRAMIDE HCL 5 MG/ML IJ SOLN: 5.0000 mg | Freq: Three times a day (TID) | INTRAMUSCULAR | Status: DC | PRN

## 2019-05-13 MED ORDER — TRAMADOL HCL 50 MG PO TABS
50.0000 mg | ORAL_TABLET | Freq: Four times a day (QID) | ORAL | Status: DC
  Administered 2019-05-13 – 2019-05-16 (×10): 50 mg via ORAL
  Filled 2019-05-13 (×10): qty 1

## 2019-05-13 MED ORDER — SODIUM CHLORIDE 0.9 % IV SOLN: INTRAVENOUS | Status: DC | PRN

## 2019-05-13 MED ORDER — ACETAMINOPHEN 10 MG/ML IV SOLN
INTRAVENOUS | Status: AC
  Filled 2019-05-13: qty 100

## 2019-05-13 MED ORDER — SODIUM CHLORIDE 0.9 % IV SOLN: INTRAVENOUS | Status: DC

## 2019-05-13 MED ORDER — ONDANSETRON HCL 4 MG/2ML IJ SOLN: 4.0000 mg | Freq: Once | INTRAMUSCULAR | Status: DC | PRN

## 2019-05-13 MED ORDER — OXYCODONE HCL 5 MG PO TABS: 5.0000 mg | ORAL_TABLET | ORAL | Status: DC | PRN

## 2019-05-13 MED ORDER — ROCURONIUM BROMIDE 50 MG/5ML IV SOLN
INTRAVENOUS | Status: AC
  Filled 2019-05-13: qty 1

## 2019-05-13 MED ORDER — FUROSEMIDE 40 MG PO TABS
40.0000 mg | ORAL_TABLET | Freq: Every day | ORAL | Status: DC
  Administered 2019-05-14: 40 mg via ORAL
  Filled 2019-05-13: qty 1

## 2019-05-13 MED ORDER — PROPOFOL 10 MG/ML IV BOLUS
INTRAVENOUS | Status: AC
  Filled 2019-05-13: qty 20

## 2019-05-13 MED ORDER — FENTANYL CITRATE (PF) 100 MCG/2ML IJ SOLN
INTRAMUSCULAR | Status: AC
  Filled 2019-05-13: qty 2

## 2019-05-13 MED ORDER — FLEET ENEMA 7-19 GM/118ML RE ENEM: 1.0000 | ENEMA | Freq: Once | RECTAL | Status: DC | PRN

## 2019-05-13 MED ORDER — METOCLOPRAMIDE HCL 10 MG PO TABS: 5.0000 mg | ORAL_TABLET | Freq: Three times a day (TID) | ORAL | Status: DC | PRN

## 2019-05-13 MED ORDER — LIDOCAINE HCL (CARDIAC) PF 100 MG/5ML IV SOSY
PREFILLED_SYRINGE | INTRAVENOUS | Status: DC | PRN
  Administered 2019-05-13: 100 mg via INTRAVENOUS

## 2019-05-13 MED ORDER — INSULIN DETEMIR 100 UNIT/ML ~~LOC~~ SOLN
30.0000 [IU] | Freq: Every day | SUBCUTANEOUS | Status: DC
  Administered 2019-05-13: 30 [IU] via SUBCUTANEOUS
  Filled 2019-05-13 (×3): qty 0.3

## 2019-05-13 MED ORDER — LOSARTAN POTASSIUM 50 MG PO TABS
100.0000 mg | ORAL_TABLET | Freq: Every day | ORAL | Status: DC
  Administered 2019-05-14: 100 mg via ORAL
  Filled 2019-05-13: qty 2

## 2019-05-13 MED ORDER — FENTANYL CITRATE (PF) 100 MCG/2ML IJ SOLN
INTRAMUSCULAR | Status: DC | PRN
  Administered 2019-05-13 (×3): 50 ug via INTRAVENOUS

## 2019-05-13 MED ORDER — ROCURONIUM BROMIDE 100 MG/10ML IV SOLN
INTRAVENOUS | Status: DC | PRN
  Administered 2019-05-13: 50 mg via INTRAVENOUS

## 2019-05-13 MED ORDER — MAGNESIUM HYDROXIDE 400 MG/5ML PO SUSP: 30.0000 mL | Freq: Every day | ORAL | Status: DC | PRN

## 2019-05-13 MED ORDER — ONDANSETRON HCL 4 MG/2ML IJ SOLN: 4.0000 mg | Freq: Four times a day (QID) | INTRAMUSCULAR | Status: DC | PRN

## 2019-05-13 MED ORDER — INSULIN DETEMIR 100 UNIT/ML ~~LOC~~ SOLN
30.0000 [IU] | Freq: Every day | SUBCUTANEOUS | Status: DC
  Administered 2019-05-14 – 2019-05-16 (×3): 30 [IU] via SUBCUTANEOUS
  Filled 2019-05-13 (×4): qty 0.3

## 2019-05-13 MED ORDER — DOCUSATE SODIUM 100 MG PO CAPS
100.0000 mg | ORAL_CAPSULE | Freq: Two times a day (BID) | ORAL | Status: DC
  Administered 2019-05-13 – 2019-05-16 (×6): 100 mg via ORAL
  Filled 2019-05-13 (×6): qty 1

## 2019-05-13 MED ORDER — INSULIN ASPART 100 UNIT/ML ~~LOC~~ SOLN
0.0000 [IU] | SUBCUTANEOUS | Status: DC
  Administered 2019-05-13 (×3): 5 [IU] via SUBCUTANEOUS
  Administered 2019-05-14 (×2): 3 [IU] via SUBCUTANEOUS
  Administered 2019-05-14 (×2): 5 [IU] via SUBCUTANEOUS
  Administered 2019-05-14: 2 [IU] via SUBCUTANEOUS
  Administered 2019-05-14 – 2019-05-15 (×2): 5 [IU] via SUBCUTANEOUS
  Administered 2019-05-15 (×2): 2 [IU] via SUBCUTANEOUS
  Administered 2019-05-15: 5 [IU] via SUBCUTANEOUS
  Administered 2019-05-15 – 2019-05-16 (×3): 3 [IU] via SUBCUTANEOUS
  Administered 2019-05-16: 2 [IU] via SUBCUTANEOUS
  Administered 2019-05-16: 3 [IU] via SUBCUTANEOUS
  Administered 2019-05-16: 2 [IU] via SUBCUTANEOUS
  Filled 2019-05-13 (×18): qty 1

## 2019-05-13 MED ORDER — ACETAMINOPHEN 500 MG PO TABS
1000.0000 mg | ORAL_TABLET | Freq: Four times a day (QID) | ORAL | Status: AC
  Administered 2019-05-13 – 2019-05-14 (×4): 1000 mg via ORAL
  Filled 2019-05-13 (×4): qty 2

## 2019-05-13 MED ORDER — PROPOFOL 10 MG/ML IV BOLUS
INTRAVENOUS | Status: DC | PRN
  Administered 2019-05-13: 100 mg via INTRAVENOUS

## 2019-05-13 MED ORDER — INSULIN NPH (HUMAN) (ISOPHANE) 100 UNIT/ML ~~LOC~~ SUSP: 30.0000 [IU] | Freq: Every day | SUBCUTANEOUS | Status: DC

## 2019-05-13 MED ORDER — PHENYLEPHRINE HCL (PRESSORS) 10 MG/ML IV SOLN
INTRAVENOUS | Status: DC | PRN
  Administered 2019-05-13 (×3): 100 ug via INTRAVENOUS

## 2019-05-13 MED ORDER — BISACODYL 10 MG RE SUPP: 10.0000 mg | Freq: Every day | RECTAL | Status: DC | PRN

## 2019-05-13 MED ORDER — ASPIRIN 81 MG PO CHEW
81.0000 mg | CHEWABLE_TABLET | Freq: Every day | ORAL | Status: DC
  Administered 2019-05-14 – 2019-05-16 (×3): 81 mg via ORAL
  Filled 2019-05-13 (×3): qty 1

## 2019-05-13 MED ORDER — APIXABAN 2.5 MG PO TABS
2.5000 mg | ORAL_TABLET | Freq: Two times a day (BID) | ORAL | Status: DC
  Administered 2019-05-14 – 2019-05-16 (×5): 2.5 mg via ORAL
  Filled 2019-05-13 (×5): qty 1

## 2019-05-13 SURGICAL SUPPLY — 55 items
BIT DRILL 4.3 (BIT) ×2
BIT DRILL 4.3MM (BIT) ×1
BIT DRILL 4.3X300MM (BIT) ×1 IMPLANT
BIT DRILL QC 3.3X195 (BIT) ×3 IMPLANT
BNDG COHESIVE 6X5 TAN STRL LF (GAUZE/BANDAGES/DRESSINGS) ×3 IMPLANT
BNDG ELASTIC 6X5.8 VLCR STR LF (GAUZE/BANDAGES/DRESSINGS) IMPLANT
BRACE KNEE POST OP SHORT (BRACE) ×3 IMPLANT
CANISTER SUCT 1200ML W/VALVE (MISCELLANEOUS) ×3 IMPLANT
CAP LOCK NCB (Cap) ×12 IMPLANT
CHLORAPREP W/TINT 26 (MISCELLANEOUS) ×3 IMPLANT
COOLER POLAR GLACIER W/PUMP (MISCELLANEOUS) ×3 IMPLANT
COVER BACK TABLE REUSABLE LG (DRAPES) ×3 IMPLANT
COVER WAND RF STERILE (DRAPES) ×3 IMPLANT
DRAPE C-ARM XRAY 36X54 (DRAPES) ×3 IMPLANT
DRAPE C-ARMOR (DRAPES) ×3 IMPLANT
DRAPE INCISE IOBAN 66X60 STRL (DRAPES) ×6 IMPLANT
DRAPE SPLIT 6X30 W/TAPE (DRAPES) ×6 IMPLANT
DRAPE U-SHAPE 47X51 STRL (DRAPES) ×3 IMPLANT
DRSG OPSITE POSTOP 4X6 (GAUZE/BANDAGES/DRESSINGS) ×3 IMPLANT
DRSG OPSITE POSTOP 4X8 (GAUZE/BANDAGES/DRESSINGS) ×3 IMPLANT
ELECT REM PT RETURN 9FT ADLT (ELECTROSURGICAL) ×3
ELECTRODE REM PT RTRN 9FT ADLT (ELECTROSURGICAL) ×1 IMPLANT
GAUZE SPONGE 4X4 12PLY STRL (GAUZE/BANDAGES/DRESSINGS) ×3 IMPLANT
GAUZE XEROFORM 1X8 LF (GAUZE/BANDAGES/DRESSINGS) ×6 IMPLANT
GLOVE BIO SURGEON STRL SZ8 (GLOVE) ×6 IMPLANT
GLOVE INDICATOR 8.0 STRL GRN (GLOVE) ×3 IMPLANT
GOWN STRL REUS W/ TWL LRG LVL3 (GOWN DISPOSABLE) ×1 IMPLANT
GOWN STRL REUS W/ TWL XL LVL3 (GOWN DISPOSABLE) ×1 IMPLANT
GOWN STRL REUS W/TWL LRG LVL3 (GOWN DISPOSABLE) ×2
GOWN STRL REUS W/TWL XL LVL3 (GOWN DISPOSABLE) ×2
HEMOVAC 400CC 10FR (MISCELLANEOUS) IMPLANT
K-WIRE 2.0 (WIRE) ×2
K-WIRE FXSTD 280X2XNS SS (WIRE) ×1
KIT TURNOVER KIT A (KITS) ×3 IMPLANT
KWIRE FXSTD 280X2XNS SS (WIRE) ×1 IMPLANT
NS IRRIG 1000ML POUR BTL (IV SOLUTION) ×3 IMPLANT
NS IRRIG 500ML POUR BTL (IV SOLUTION) ×3 IMPLANT
PACK HIP PROSTHESIS (MISCELLANEOUS) ×3 IMPLANT
PAD WRAPON POLAR KNEE (MISCELLANEOUS) ×1 IMPLANT
PIN GUIDE DRILL TIP 2.8X300 (DRILL) ×3 IMPLANT
PLATE 9H LEFT NCB (Plate) ×3 IMPLANT
SCREW CORTICAL NCB 5.0X40 (Screw) ×3 IMPLANT
SCREW CORTICAL NCB 5.0X44 (Screw) ×3 IMPLANT
SCREW NCB 3.5X75X5X6.2XST (Screw) ×1 IMPLANT
SCREW NCB 5.0X38 (Screw) ×9 IMPLANT
SCREW NCB 5.0X75MM (Screw) ×2 IMPLANT
SCREW NCB 5.0X85MM (Screw) ×12 IMPLANT
SPONGE LAP 18X18 RF (DISPOSABLE) ×3 IMPLANT
STAPLER SKIN PROX 35W (STAPLE) ×3 IMPLANT
STOCKINETTE M/LG 89821 (MISCELLANEOUS) IMPLANT
SUT VIC AB 0 CT1 36 (SUTURE) ×3 IMPLANT
SUT VIC AB 2-0 CT1 27 (SUTURE) ×4
SUT VIC AB 2-0 CT1 TAPERPNT 27 (SUTURE) ×2 IMPLANT
TAPE MICROFOAM 4IN (TAPE) IMPLANT
WRAPON POLAR PAD KNEE (MISCELLANEOUS) ×3

## 2019-05-13 NOTE — Anesthesia Procedure Notes (Signed)
Performed by: Nelda Marseille, CRNA

## 2019-05-13 NOTE — Op Note (Signed)
05/12/2019 - 05/13/2019  3:27 PM  Patient:   Edward Strong  Pre-Op Diagnosis:   Closed displaced left periprosthetic supracondylar femur fracture.  Post-Op Diagnosis:   Same  Procedure:   Open reduction and internal fixation of closed displaced left periprosthetic supracondylar femur fracture.  Surgeon:   Pascal Lux, MD  Assistant:   Cherlynn June, PA-S  Anesthesia:   GET  Findings:   As above.  Complications:   None  Fluids:   700 cc crystalloid  EBL:   200 cc  UOP:   None  TT:   None  Drains:   None  Closure:   Staples  Implants:   [employer] 9-hole precontoured supracondylar femoral plate with 5 distal screws with locking caps and 4 bicortical proximal screws.  Brief Clinical Note:   The patient is an 86 year old male who sustained the above-noted injury yesterday morning when he apparently got his leg tangled in a chair leg while trying to get out of the chair.  He was brought to the emergency room where x-rays demonstrated the above-noted injury.  He has been cleared medically and presents at this time for definitive management of his injury.  Procedure:   The patient was brought into the operating room and lain in the supine position.  After adequate general endotracheal intubation and anesthesia were obtained, the patient's left lower extremity was prepped with ChloraPrep solution before being draped sterilely.  Preoperative antibiotics were administered.  A timeout was performed to verify the appropriate surgical site before an approximately 6 to 7 inch incision was made over the lateral aspect of the distal femur.  The incision was carried down through the subcutaneous tissues to expose the iliotibial band.  This was split the length of the incision to expose the lateral aspect of the lateral femoral condyle.  The fracture was reduced using longitudinal traction and appropriate rotation.  The adequacy reduction was verified fluoroscopically in AP and lateral  projections.  A 22-gauge wire was passed circumferentially around the fracture and tightened securely to support the posterior spike and reduce it to the shaft fracture fragment.  A 9-hole [employer] precontoured supracondylar femoral plate was selected.  After verifying that it was of appropriate length, the plate was carefully positioned utilizing fluoroscopic imaging in AP and lateral projections before it was temporarily secured using a K wire distally and a bicortical screw proximally.    Distally, the plate was secured using five fully threaded cancellus screws.  The adequacy of screw position was verified fluoroscopically in AP and lateral projections and found to be excellent.  Each of the screws was covered with a locking cap.  Proximally, the plate was secured using three additional bicortical screws of appropriate length.  These screws were placed through stab incisions.  Again the adequacy of hardware position and overall femoral alignment/fracture reduction was verified fluoroscopically in AP and lateral projections and found to be excellent.  The wounds were copiously irrigated with sterile saline solution using bulb irrigation before the iliotibial band was reapproximated using #0 Vicryl interrupted sutures.  The subcutaneous tissues were closed in two layers using 2-0 Vicryl interrupted sutures before the skin was closed with staples.  Sterile occlusive dressings were applied to the wounds before the patient was placed into a hinged knee brace with hinges set at 0 to 90 degrees but locked in extension.  A Polar Care device also was applied.  The patient was then awakened, extubated, and returned to the recovery room  in satisfactory condition after tolerating the procedure well.

## 2019-05-13 NOTE — Anesthesia Procedure Notes (Signed)
Procedure Name: Intubation Date/Time: 05/13/2019 12:58 PM Performed by: Nelda Marseille, CRNA Pre-anesthesia Checklist: Patient identified, Emergency Drugs available, Suction available and Patient being monitored Patient Re-evaluated:Patient Re-evaluated prior to induction Oxygen Delivery Method: Circle system utilized Preoxygenation: Pre-oxygenation with 100% oxygen Induction Type: IV induction Ventilation: Mask ventilation without difficulty Laryngoscope Size: 4 and McGraph Grade View: Grade I Tube type: Oral Number of attempts: 1 Airway Equipment and Method: Stylet Placement Confirmation: ETT inserted through vocal cords under direct vision,  positive ETCO2 and breath sounds checked- equal and bilateral Secured at: 21 cm Tube secured with: Tape Dental Injury: Teeth and Oropharynx as per pre-operative assessment

## 2019-05-13 NOTE — Plan of Care (Signed)
  Problem: Nutrition: Goal: Adequate nutrition will be maintained Outcome: Completed/Met

## 2019-05-13 NOTE — Transfer of Care (Signed)
Immediate Anesthesia Transfer of Care Note  Patient: Edward Strong  Procedure(s) Performed: OPEN REDUCTION INTERNAL FIXATION (ORIF) DISTAL FEMUR FRACTURE, LEFT (Left )  Patient Location: PACU  Anesthesia Type:General  Level of Consciousness: sedated  Airway & Oxygen Therapy: Patient Spontanous Breathing and Patient connected to face mask oxygen  Post-op Assessment: Report given to RN and Post -op Vital signs reviewed and stable  Post vital signs: Reviewed and stable  Last Vitals:  Vitals Value Taken Time  BP 113/65 05/13/19 1540  Temp    Pulse 63 05/13/19 1546  Resp 15 05/13/19 1546  SpO2 100 % 05/13/19 1546  Vitals shown include unvalidated device data.  Last Pain:  Vitals:   05/13/19 1153  TempSrc: Tympanic  PainSc: 0-No pain         Complications: No apparent anesthesia complications

## 2019-05-13 NOTE — Progress Notes (Signed)
[facility] Progress Note  Patient: Edward Strong [id number]   PCP: Patient, No Pcp Per DOB: [birth year]   DOA: 05/12/2019   DOS: 05/13/2019   Date of Service: the patient was seen and examined on 05/13/2019  Chief Complaint  Patient presents with  . Knee Pain   Brief hospital course: hypertension, hyperlipidemia, diabetes mellitus, stroke, CAD, stent placement, dCHF, Hodgkin's lymphoma, OSA, atrial fibrillation on Eliquis, CKD stage III, left knee replacement, who presents with left knee pain. Found to have a closed left distal femur fracture. Cardiology was consulted who cleared the patient for surgery.  Currently further plan is monitor postoperative recovery.  Subjective: Denies any chest pain or abdominal pain.  Pain is still significantly present on the left leg.  No nausea no vomiting.   Assessment and Plan: Scheduled Meds: . acetaminophen  1,000 mg Oral Q6H  . [START ON 05/14/2019] apixaban  2.5 mg Oral BID  . aspirin  81 mg Oral Daily  . docusate sodium  100 mg Oral BID  . furosemide  40 mg Oral Daily  . insulin aspart  0-15 Units Subcutaneous Q4H  . [START ON 05/14/2019] insulin detemir  30 Units Subcutaneous Daily  . losartan  100 mg Oral Daily  . [START ON 05/14/2019] Ensure Max Protein  11 oz Oral BID BM  . traMADol  50 mg Oral Q6H   Continuous Infusions: . sodium chloride 75 mL/hr at 05/13/19 1839  .  ceFAZolin (ANCEF) IV     PRN Meds: bisacodyl, diphenhydrAMINE, hydrALAZINE, magnesium hydroxide, methocarbamol, metoCLOPramide **OR** metoCLOPramide (REGLAN) injection, morphine injection, ondansetron **OR** ondansetron (ZOFRAN) IV, oxyCODONE, senna-docusate, sodium phosphate  Closed fracture of left distal femur ([place]): As evidenced by x-ray. Patient has moderate pain now. No neurovascular compromise. Orthopedic surgeon, Dr. Roland Rack was consulted.  Cardiology consulted. Patient tolerated procedure very well. We will follow-up on recommendation from orthopedics as well  as PT and OT for discharge planning.  Leukocytosis: WBC 17.4  Likely due to stress-induced demargination. Patient does not have signs of infection. -Follow-up CBC  Type II diabetes mellitus with renal manifestations [facility]): Last A1c 7.1, poorly controled. Patient is taking NPH insulin and Novolin at home -NPH insulin 30 units daily -SSI, resume home dose on 05/14/2019.  HTN:  -Continue home medications: Cozaar and Lasix -hydralazine prn  CKD (chronic kidney disease), stage IIIa: Renal function close to baseline.  Baseline creatinine 1.3-1.5.  His creatinine is 1.53, BUN 41 -f/u by BMP  Atrial fibrillation ([place]): HR 85 -hold Eliquis for surgery -tele monitoring -Resume per surgery.  Should be back on on 05/14/2019.  CAD (coronary artery disease): s/p stent. No CP -on ASA  Chronic systolic CHF (congestive heart failure) ([place]): stable.  2D echo on 05/21/2015 showed a EF >55%. No Leg edema. -continue home lasix  Obesity Body mass index is 32.07 kg/m.  Patient will benefit of outpatient dietary consultation.  Nutrition Problem: Increased nutrient needs Etiology: post-op healing Interventions: Interventions: Refer to RD note for recommendations      Diet: Cardiac diet  DVT Prophylaxis: Per surgery  Advance goals of care discussion: Full code  Family Communication: family was present at bedside, at the time of interview. The pt provided permission to discuss medical plan with the family. Opportunity was given to ask question and all questions were answered satisfactorily.   Disposition:  Discharge to be determined.  Consultants: Cardiology, orthopedics Procedures: Open reduction and internal fixation of closed displaced left periprosthetic supracondylar femur fracture  Antibiotics: Anti-infectives (From admission,  onward)   Start     Dose/Rate Route Frequency Ordered Stop   05/13/19 1900  ceFAZolin (ANCEF) IVPB 2g/100 mL premix     2 g 200 mL/hr over 30 Minutes  Intravenous Every 6 hours 05/13/19 1653 05/14/19 1259   05/13/19 1400  ceFAZolin (ANCEF) IVPB 2g/100 mL premix     2 g 200 mL/hr over 30 Minutes Intravenous  Once 05/12/19 1129 05/13/19 1304   05/13/19 1201  ceFAZolin (ANCEF) 2-4 GM/100ML-% IVPB    Note to Pharmacy: Nyra Jabs   : cabinet override      05/13/19 1201 05/13/19 1324       Objective: Physical Exam: Vitals:   05/13/19 1632 05/13/19 1700 05/13/19 1722 05/13/19 1827  BP:  101/74 111/68 135/77  Pulse: 73 71 74 71  Resp: 18     Temp: 97.7 F (36.5 C) (!) 97.5 F (36.4 C) 98 F (36.7 C)   TempSrc:  Oral Oral   SpO2: 94% 94% 93% 94%  Weight:      Height:        Intake/Output Summary (Last 24 hours) at 05/13/2019 1853 Last data filed at 05/13/2019 1514 Gross per 24 hour  Intake 800 ml  Output 800 ml  Net 0 ml   Filed Weights   05/12/19 0844 05/13/19 1153  Weight: 104.3 kg 104.3 kg   General: alert and oriented to time, place, and person. Appear in mild distress, affect appropriate Eyes: PERRL, Conjunctiva normal ENT: Oral Mucosa Clear, moist  Neck: no JVD, no Abnormal Mass Or lumps Cardiovascular: S1 and S2 Present, no Murmur,  Respiratory: good respiratory effort, Bilateral Air entry equal and Decreased, no signs of accessory muscle use, Clear to Auscultation, no Crackles, no wheezes Abdomen: Bowel Sound present, Soft and no tenderness, no hernia Skin: no rashes  Extremities: no Pedal edema, no calf tenderness Neurologic: without any new focal findings Gait not checked due to patient safety concerns  Data Reviewed: I have personally reviewed and interpreted daily labs, tele strips, imagings as discussed above. I reviewed all nursing notes, pharmacy notes, vitals, pertinent old records I have discussed plan of care as described above with RN and patient/family.  CBC: Recent Labs  Lab 05/12/19 1222  WBC 17.4*  NEUTROABS 15.5*  HGB 13.5  HCT 38.7*  MCV 93.9  PLT 0000000   Basic Metabolic  Panel: Recent Labs  Lab 05/12/19 1222  NA 138  K 4.4  CL 105  CO2 20*  GLUCOSE 206*  BUN 41*  CREATININE 1.53*  CALCIUM 9.4    Liver Function Tests: Recent Labs  Lab 05/12/19 1222  AST 28  ALT 19  ALKPHOS 63  BILITOT 1.3*  PROT 7.2  ALBUMIN 3.5   No results for input(s): LIPASE, AMYLASE in the last 168 hours. No results for input(s): AMMONIA in the last 168 hours. Coagulation Profile: Recent Labs  Lab 05/12/19 1222  INR 1.4*   Cardiac Enzymes: No results for input(s): CKTOTAL, CKMB, CKMBINDEX, TROPONINI in the last 168 hours. BNP (last 3 results) No results for input(s): PROBNP in the last 8760 hours. CBG: Recent Labs  Lab 05/13/19 0805 05/13/19 1052 05/13/19 1220 05/13/19 1547 05/13/19 1723  GLUCAP 300* 235* 222* 198* 204*   Studies: DG C-Arm 1-60 Min  Result Date: 05/13/2019 CLINICAL DATA:  ORIF of distal LEFT femoral fracture EXAM: LEFT FEMUR 2 VIEWS; DG C-ARM 1-60 MIN COMPARISON:  05/12/2019 FLUOROSCOPY TIME:  1 minutes 1 second Images obtained: 4 FINDINGS: Images demonstrate placement of  a lateral plate and multiple screws across a reduced fracture of the distal LEFT femoral metadiaphysis. Bones appear demineralized. Cerclage wire present. LEFT knee prosthesis seen. No additional osseous abnormalities identified. IMPRESSION: Post ORIF of previously identified distal LEFT femoral metadiaphyseal fracture. Electronically Signed   By: Lavonia Dana M.D.   On: 05/13/2019 15:03   DG FEMUR MIN 2 VIEWS LEFT  Result Date: 05/13/2019 CLINICAL DATA:  ORIF of distal LEFT femoral fracture EXAM: LEFT FEMUR 2 VIEWS; DG C-ARM 1-60 MIN COMPARISON:  05/12/2019 FLUOROSCOPY TIME:  1 minutes 1 second Images obtained: 4 FINDINGS: Images demonstrate placement of a lateral plate and multiple screws across a reduced fracture of the distal LEFT femoral metadiaphysis. Bones appear demineralized. Cerclage wire present. LEFT knee prosthesis seen. No additional osseous abnormalities  identified. IMPRESSION: Post ORIF of previously identified distal LEFT femoral metadiaphyseal fracture. Electronically Signed   By: Lavonia Dana M.D.   On: 05/13/2019 15:03     Time spent: 35 minutes  Author: Berle Mull, MD [facility] 05/13/2019 6:53 PM  To reach On-call, see care teams to locate the attending and reach out to them via [email or web address]. If 7PM-7AM, please contact night-coverage If you still have difficulty reaching the attending provider, please page the [facility] (Director on Call) for [facility] on amion for assistance.

## 2019-05-13 NOTE — Anesthesia Preprocedure Evaluation (Signed)
Anesthesia Evaluation  Patient identified by MRN, date of birth, ID band Patient awake    Reviewed: Allergy & Precautions, H&P , NPO status , Patient's Chart, lab work & pertinent test results, reviewed documented beta blocker date and time   History of Anesthesia Complications Negative for: history of anesthetic complications  Airway Mallampati: III  TM Distance: >3 FB Neck ROM: full    Dental  (+) Dental Advidsory Given, Poor Dentition, Missing   Pulmonary neg shortness of breath, sleep apnea , neg COPD, neg recent URI, former smoker,    Pulmonary exam normal        Cardiovascular Exercise Tolerance: Good hypertension, (-) angina+ CAD, + Cardiac Stents and +CHF  (-) Past MI and (-) CABG + dysrhythmias Atrial Fibrillation (-) Valvular Problems/Murmurs Rhythm:irregular     Neuro/Psych negative neurological ROS  negative psych ROS   GI/Hepatic negative GI ROS, Neg liver ROS,   Endo/Other  diabetes  Renal/GU CRFRenal disease  negative genitourinary   Musculoskeletal   Abdominal   Peds  Hematology negative hematology ROS (+)   Anesthesia Other Findings Past Medical History: No date: Atrial fibrillation (HCC) No date: CKD (chronic kidney disease), stage IIIa No date: HTN (hypertension) No date: Type II diabetes mellitus with renal manifestations (H[facility])   Reproductive/Obstetrics negative OB ROS                             Anesthesia Physical Anesthesia Plan  ASA: III  Anesthesia Plan: General   Post-op Pain Management:    Induction: Intravenous  PONV Risk Score and Plan: 2 and Ondansetron, Dexamethasone and Treatment may vary due to age or medical condition  Airway Management Planned: Oral ETT  Additional Equipment:   Intra-op Plan:   Post-operative Plan: Extubation in OR  Informed Consent: I have reviewed the patients History and Physical, chart, labs and discussed the  procedure including the risks, benefits and alternatives for the proposed anesthesia with the patient or authorized representative who has indicated his/her understanding and acceptance.     Dental Advisory Given  Plan Discussed with: Anesthesiologist, CRNA and Surgeon  Anesthesia Plan Comments:         Anesthesia Quick Evaluation

## 2019-05-13 NOTE — Progress Notes (Signed)
Initial Nutrition Assessment  DOCUMENTATION CODES:   Obesity unspecified  INTERVENTION:  Provide Ensure Max Protein po BID, each supplement provides 150 kcal and 30 grams of protein.  NUTRITION DIAGNOSIS:   Increased nutrient needs related to post-op healing as evidenced by estimated needs.  GOAL:   Patient will meet greater than or equal to 90% of their needs  MONITOR:   PO intake, Supplement acceptance, Diet advancement, Labs, Weight trends, Skin, I & O's  REASON FOR ASSESSMENT:   Consult Assessment of nutrition requirement/status  ASSESSMENT:   86 year old male with PMHx of HTN, DM, A-fib, CKD stage III, CAD, chronic systolic CHF admitted with closed fracture of left distal femur.   Unable to see patient as he is down in OR. Yesterday patient was on heart healthy/carbohydrate modified diet and ate 50% of dinner last night. He has been NPO today for OR. Patient will have increased needs for post-op healing.  No weight history in chart.  Medications reviewed and include: Lasix 40 mg daily, Novolog 0-15 units Q4hrs, Levemir 30 units daily, cefazolin.  Labs reviewed: CBG 222-300, CO2 20, BUN 41, Creatinine 1.53.  Unable to determine if patient meets criteria for malnutrition at this time.  NUTRITION - FOCUSED PHYSICAL EXAM:  Unable to complete as patient not in room.  Diet Order:   Diet Order            Diet NPO time specified Except for: Ice Chips, Sips with Meds  Diet effective midnight             EDUCATION NEEDS:   No education needs have been identified at this time  Skin:  Skin Assessment: Reviewed RN Assessment  Last BM:  Unknown  Height:   Ht Readings from Last 1 Encounters:  05/13/19 5\' 11"  (1.803 m)   Weight:   Wt Readings from Last 1 Encounters:  05/13/19 104.3 kg   Ideal Body Weight:  78.2 kg  BMI:  Body mass index is 32.07 kg/m.  Estimated Nutritional Needs:   Kcal:  2100-2300  Protein:  105-115 grams  Fluid:  1.8-2  L/day  Jacklynn Barnacle, MS, RD, LDN Office: [phone] Pager: [phone] After Hours/Weekend Pager: [phone]

## 2019-05-13 NOTE — Anesthesia Postprocedure Evaluation (Signed)
Anesthesia Post Note  Patient: Edward Strong  Procedure(s) Performed: OPEN REDUCTION INTERNAL FIXATION (ORIF) of closed  left periprosthetic supracondylar femur fracture. (Left )  Patient location during evaluation: PACU Anesthesia Type: General Level of consciousness: awake and alert Pain management: pain level controlled Vital Signs Assessment: post-procedure vital signs reviewed and stable Respiratory status: spontaneous breathing, nonlabored ventilation, respiratory function stable and patient connected to nasal cannula oxygen Cardiovascular status: blood pressure returned to baseline and stable Postop Assessment: no apparent nausea or vomiting Anesthetic complications: no     Last Vitals:  Vitals:   05/13/19 1700 05/13/19 1722  BP: 101/74 111/68  Pulse: 71 74  Resp:    Temp: (!) 36.4 C 36.7 C  SpO2: 94% 93%    Last Pain:  Vitals:   05/13/19 1722  TempSrc: Oral  PainSc:                  Precious Haws Flavius Repsher

## 2019-05-13 NOTE — Progress Notes (Signed)
D: Pt alert and oriented. Pt denies experiencing any pain at this time, only states that his leg is sore. Pt is fawned of pain medication, they worry him. Pt took some time to recover to full alertness after returning from surgical procedure.    A: Scheduled medications administered to pt, per MD orders. Support and encouragement provided. Frequent verbal contact made.   R: No adverse drug reactions noted. Pt complaint with medications and treatment plan. Pt interacts well with staff on the unit. Pt is stable at this time, will continue to monitor and provide care for as ordered.

## 2019-05-14 DIAGNOSIS — S72402D Unspecified fracture of lower end of left femur, subsequent encounter for closed fracture with routine healing: Secondary | ICD-10-CM

## 2019-05-14 LAB — GLUCOSE, CAPILLARY
Glucose-Capillary: 228 mg/dL — ABNORMAL HIGH (ref 70–99)
Glucose-Capillary: 174 mg/dL — ABNORMAL HIGH (ref 70–99)
Glucose-Capillary: 136 mg/dL — ABNORMAL HIGH (ref 70–99)
Glucose-Capillary: 193 mg/dL — ABNORMAL HIGH (ref 70–99)
Glucose-Capillary: 233 mg/dL — ABNORMAL HIGH (ref 70–99)
Glucose-Capillary: 248 mg/dL — ABNORMAL HIGH (ref 70–99)

## 2019-05-14 LAB — BASIC METABOLIC PANEL
Anion gap: 7 (ref 5–15)
BUN: 45 mg/dL — ABNORMAL HIGH (ref 8–23)
CO2: 22 mmol/L (ref 22–32)
Calcium: 8.1 mg/dL — ABNORMAL LOW (ref 8.9–10.3)
Chloride: 106 mmol/L (ref 98–111)
Creatinine, Ser: 1.76 mg/dL — ABNORMAL HIGH (ref 0.61–1.24)
GFR calc Af Amer: 39 mL/min — ABNORMAL LOW (ref >60)
GFR calc non Af Amer: 34 mL/min — ABNORMAL LOW (ref >60)
Glucose, Bld: 178 mg/dL — ABNORMAL HIGH (ref 70–99)
Potassium: 4.4 mmol/L (ref 3.5–5.1)
Sodium: 135 mmol/L (ref 135–145)

## 2019-05-14 LAB — CBC
HCT: 28.3 % — ABNORMAL LOW (ref 39.0–52.0)
Hemoglobin: 9.4 g/dL — ABNORMAL LOW (ref 13.0–17.0)
MCH: 33.5 pg (ref 26.0–34.0)
MCHC: 33.2 g/dL (ref 30.0–36.0)
MCV: 100.7 fL — ABNORMAL HIGH (ref 80.0–100.0)
Platelets: 144 10*3/uL — ABNORMAL LOW (ref 150–400)
RBC: 2.81 MIL/uL — ABNORMAL LOW (ref 4.22–5.81)
RDW: 13 % (ref 11.5–15.5)
WBC: 13.5 10*3/uL — ABNORMAL HIGH (ref 4.0–10.5)
nRBC: 0 % (ref 0.0–0.2)

## 2019-05-14 MED ORDER — POLYETHYLENE GLYCOL 3350 17 G PO PACK
17.0000 g | PACK | Freq: Every day | ORAL | Status: DC
  Administered 2019-05-14 – 2019-05-16 (×3): 17 g via ORAL
  Filled 2019-05-14 (×2): qty 1

## 2019-05-14 NOTE — Progress Notes (Signed)
PROGRESS NOTE                                                                                                                                                                                                             Patient Demographics:    Edward Strong, is a 86 y.o. male, DOB - 21-May-1929, [id number]  Admit date - 05/12/2019   Admitting Physician Ivor Costa, MD  Outpatient Primary MD for the patient is Patient, No Pcp Per  LOS - 2    Chief Complaint  Patient presents with  . Knee Pain       Brief Narrative   86 year old male with history of CAD, stroke, diastolic CHF, Hodgkin's lymphoma, A. fib on Eliquis, CKD stage III, hypertension, diabetes mellitus who presented with closed left distal (supracondylar) femur fracture and underwent ORIF.    Subjective:   Patient reports pain to be well controlled on current meds.   Assessment  & Plan :    Principal Problem:   Closed fracture of left distal femur (HCC) S/p ORIF of closed left periprosthetic supracondylar femur fracture. Pain control with as needed Percocet.  Added bowel regimen.  PT/OT.  Needs SNF as patient lives alone. Eliquis resumed. Orthopedics recommends nonweightbearing on left leg.  Active Problems:   Type II diabetes mellitus with renal manifestations (HCC) Stable.  Continue NPH and sliding scale coverage.   Essential HTN (hypertension) Stable.  Continue Lasix and Cozaar.     CKD (chronic kidney disease), stage IIIa Renal function at baseline (creatinine 1.3-1.5).    Atrial fibrillation, paroxysmal (HCC) Rate controlled.  Continue Eliquis.     Chronic systolic CHF (congestive heart failure) (HCC) Euvolemic.  Continue Lasix      Code Status : Full code  Family Communication  : Son at bedside  Disposition Plan  : SNF possibly in the next 24-48 hours  Barriers For Discharge : Postoperative, active symptoms  Consults  :  Orthopedic  Procedures  : ORIF left femur  DVT Prophylaxis  : Eliquis  Lab Results  Component Value Date   PLT 144 (L) 05/14/2019    Antibiotics  :   Anti-infectives (From admission, onward)   Start     Dose/Rate Route Frequency Ordered Stop   05/13/19 1900  ceFAZolin (ANCEF) IVPB 2g/100 mL premix     2 g 200  mL/hr over 30 Minutes Intravenous Every 6 hours 05/13/19 1653 05/14/19 1056   05/13/19 1400  ceFAZolin (ANCEF) IVPB 2g/100 mL premix     2 g 200 mL/hr over 30 Minutes Intravenous  Once 05/12/19 1129 05/13/19 1304   05/13/19 1201  ceFAZolin (ANCEF) 2-4 GM/100ML-% IVPB    Note to Pharmacy: Nyra Jabs   : cabinet override      05/13/19 1201 05/13/19 1324        Objective:   Vitals:   05/14/19 0044 05/14/19 0407 05/14/19 0723 05/14/19 1159  BP: 136/70 130/69 117/64 117/62  Pulse: 80 93 68 70  Resp: 18 17 17 17   Temp: 97.6 F (36.4 C) 98.1 F (36.7 C) 97.6 F (36.4 C) 97.7 F (36.5 C)  TempSrc: Axillary Axillary Oral Oral  SpO2: 96% 95% 97% 95%  Weight:      Height:        Wt Readings from Last 3 Encounters:  05/13/19 104.3 kg     Intake/Output Summary (Last 24 hours) at 05/14/2019 1308 Last data filed at 05/14/2019 0900 Gross per 24 hour  Intake 1820 ml  Output 1475 ml  Net 345 ml     Physical Exam  Gen: not in distress HEENT:  moist mucosa, supple neck Chest: clear b/l, no added sounds CVS: N S1&S2, no murmurs, rubs or gallop GI: soft, NT, ND,  Musculoskeletal: warm, no edema, dressing over left hip with strap and brace     Data Review:    CBC Recent Labs  Lab 05/12/19 1222 05/14/19 0446  WBC 17.4* 13.5*  HGB 13.5 9.4*  HCT 38.7* 28.3*  PLT 178 144*  MCV 93.9 100.7*  MCH 32.8 33.5  MCHC 34.9 33.2  RDW 12.8 13.0  LYMPHSABS 0.8  --   MONOABS 0.9  --   EOSABS 0.0  --   BASOSABS 0.1  --     Chemistries  Recent Labs  Lab 05/12/19 1222 05/14/19 0446  NA 138 135  K 4.4 4.4  CL 105 106  CO2 20* 22  GLUCOSE 206* 178*  BUN  41* 45*  CREATININE 1.53* 1.76*  CALCIUM 9.4 8.1*  AST 28  --   ALT 19  --   ALKPHOS 63  --   BILITOT 1.3*  --    ------------------------------------------------------------------------------------------------------------------ No results for input(s): CHOL, HDL, LDLCALC, TRIG, CHOLHDL, LDLDIRECT in the last 72 hours.  Lab Results  Component Value Date   HGBA1C 7.8 (H) 05/12/2019   ------------------------------------------------------------------------------------------------------------------ No results for input(s): TSH, T4TOTAL, T3FREE, THYROIDAB in the last 72 hours.  Invalid input(s): FREET3 ------------------------------------------------------------------------------------------------------------------ No results for input(s): VITAMINB12, FOLATE, FERRITIN, TIBC, IRON, RETICCTPCT in the last 72 hours.  Coagulation profile Recent Labs  Lab 05/12/19 1222  INR 1.4*    No results for input(s): DDIMER in the last 72 hours.  Cardiac Enzymes No results for input(s): CKMB, TROPONINI, MYOGLOBIN in the last 168 hours.  Invalid input(s): CK ------------------------------------------------------------------------------------------------------------------    Component Value Date/Time   BNP 107.0 (H) 05/12/2019 1230    Inpatient Medications  Scheduled Meds: . acetaminophen  1,000 mg Oral Q6H  . apixaban  2.5 mg Oral BID  . aspirin  81 mg Oral Daily  . docusate sodium  100 mg Oral BID  . furosemide  40 mg Oral Daily  . insulin aspart  0-15 Units Subcutaneous Q4H  . insulin detemir  30 Units Subcutaneous Daily  . losartan  100 mg Oral Daily  . polyethylene glycol  17 g Oral  Daily  . Ensure Max Protein  11 oz Oral BID BM  . traMADol  50 mg Oral Q6H   Continuous Infusions: . sodium chloride 75 mL/hr at 05/14/19 1025   PRN Meds:.bisacodyl, diphenhydrAMINE, hydrALAZINE, magnesium hydroxide, methocarbamol, metoCLOPramide **OR** metoCLOPramide (REGLAN) injection, morphine  injection, ondansetron **OR** ondansetron (ZOFRAN) IV, oxyCODONE, senna-docusate, sodium phosphate  Micro Results Recent Results (from the past 240 hour(s))  Respiratory Panel by RT PCR (Flu A&B, Covid) - Nasopharyngeal Swab     Status: None   Collection Time: 05/12/19 12:23 PM   Specimen: Nasopharyngeal Swab  Result Value Ref Range Status   SARS Coronavirus 2 by RT PCR NEGATIVE NEGATIVE Final    Comment: (NOTE) SARS-CoV-2 target nucleic acids are NOT DETECTED. The SARS-CoV-2 RNA is generally detectable in upper respiratoy specimens during the acute phase of infection. The lowest concentration of SARS-CoV-2 viral copies this assay can detect is 131 copies/mL. A negative result does not preclude SARS-Cov-2 infection and should not be used as the sole basis for treatment or other patient management decisions. A negative result may occur with  improper specimen collection/handling, submission of specimen other than nasopharyngeal swab, presence of viral mutation(s) within the areas targeted by this assay, and inadequate number of viral copies (<131 copies/mL). A negative result must be combined with clinical observations, patient history, and epidemiological information. The expected result is Negative. Fact Sheet for Patients:  PinkCheek.be Fact Sheet for Healthcare Providers:  GravelBags.it This test is not yet ap proved or cleared by the [place] FDA and  has been authorized for detection and/or diagnosis of SARS-CoV-2 by FDA under an Emergency Use Authorization (EUA). This EUA will remain  in effect (meaning this test can be used) for the duration of the COVID-19 declaration under Section 564(b)(1) of the Act, 21 U.S.C. section 360bbb-3(b)(1), unless the authorization is terminated or revoked sooner.    Influenza A by PCR NEGATIVE NEGATIVE Final   Influenza B by PCR NEGATIVE NEGATIVE Final    Comment: (NOTE) The  Xpert Xpress SARS-CoV-2/FLU/RSV assay is intended as an aid in  the diagnosis of influenza from Nasopharyngeal swab specimens and  should not be used as a sole basis for treatment. Nasal washings and  aspirates are unacceptable for Xpert Xpress SARS-CoV-2/FLU/RSV  testing. Fact Sheet for Patients: PinkCheek.be Fact Sheet for Healthcare Providers: GravelBags.it This test is not yet approved or cleared by the [place] FDA and  has been authorized for detection and/or diagnosis of SARS-CoV-2 by  FDA under an Emergency Use Authorization (EUA). This EUA will remain  in effect (meaning this test can be used) for the duration of the  Covid-19 declaration under Section 564(b)(1) of the Act, 21  U.S.C. section 360bbb-3(b)(1), unless the authorization is  terminated or revoked. Performed at [facility], [address], [place],  [address]     Radiology Reports DG Chest Portable 1 View  Result Date: 05/12/2019 CLINICAL DATA:  Preoperative examination for patient with a distal left femur fracture suffered in a fall today. EXAM: PORTABLE CHEST 1 VIEW COMPARISON:  None. FINDINGS: There is volume loss in the right chest with suture material present. Calcified granulomata are seen in the right lung and a calcified right hilar lymph node is also noted. Lungs are otherwise clear. No pneumothorax or pleural effusion. Heart size is normal. Atherosclerosis is seen. IMPRESSION: No acute disease. Atherosclerosis. Electronically Signed   By: Inge Rise M.D.   On: 05/12/2019 12:03   DG Knee Complete 4 Views  Left  Result Date: 05/12/2019 CLINICAL DATA:  Left knee pain after falling at home today. EXAM: LEFT KNEE - COMPLETE 4+ VIEW COMPARISON:  None. FINDINGS: Status post total knee arthroplasty. There is a comminuted and moderately displaced fracture of the distal femur just proximal to the femoral component of the arthroplasty.  This fracture demonstrates up to 2.1 cm of posterior and 4.3 cm of proximal displacement. No dislocation. The proximal tibia and fibula are intact. Diffuse vascular calcifications are noted. IMPRESSION: Comminuted and moderately displaced fracture of the distal femur just proximal to the femoral component of the total knee arthroplasty. Electronically Signed   By: Richardean Sale M.D.   On: 05/12/2019 10:45   DG C-Arm 1-60 Min  Result Date: 05/13/2019 CLINICAL DATA:  ORIF of distal LEFT femoral fracture EXAM: LEFT FEMUR 2 VIEWS; DG C-ARM 1-60 MIN COMPARISON:  05/12/2019 FLUOROSCOPY TIME:  1 minutes 1 second Images obtained: 4 FINDINGS: Images demonstrate placement of a lateral plate and multiple screws across a reduced fracture of the distal LEFT femoral metadiaphysis. Bones appear demineralized. Cerclage wire present. LEFT knee prosthesis seen. No additional osseous abnormalities identified. IMPRESSION: Post ORIF of previously identified distal LEFT femoral metadiaphyseal fracture. Electronically Signed   By: Lavonia Dana M.D.   On: 05/13/2019 15:03   DG FEMUR MIN 2 VIEWS LEFT  Result Date: 05/13/2019 CLINICAL DATA:  ORIF of distal LEFT femoral fracture EXAM: LEFT FEMUR 2 VIEWS; DG C-ARM 1-60 MIN COMPARISON:  05/12/2019 FLUOROSCOPY TIME:  1 minutes 1 second Images obtained: 4 FINDINGS: Images demonstrate placement of a lateral plate and multiple screws across a reduced fracture of the distal LEFT femoral metadiaphysis. Bones appear demineralized. Cerclage wire present. LEFT knee prosthesis seen. No additional osseous abnormalities identified. IMPRESSION: Post ORIF of previously identified distal LEFT femoral metadiaphyseal fracture. Electronically Signed   By: Lavonia Dana M.D.   On: 05/13/2019 15:03    Time Spent in minutes  35   Yanna Leaks M.D on 05/14/2019 at 1:08 PM  Between 7am to 7pm - Pager - [phone]  After 7pm go to [email or web address] - password [facility]  [facility] -  Office   [phone]

## 2019-05-14 NOTE — Evaluation (Signed)
Physical Therapy Evaluation Patient Details Name: NITAI CONSER MRN: [id number] DOB: August 25, 1929 Today's Date: 05/14/2019   History of Present Illness  Pt is n 86 year old M admitted s/pORIF of L distal femur following a periprosthetic supracondylar fracture when pt caught his L LE in a plastic chair at home.  Guys includes CVA, CKD III, atrial fibrillation and Hodgkins.  Clinical Impression  Pt is an 86 year old M who lives in a one story home alone.  He is a limited community ambulator with intermittent use of a RW at baseline.  Pt pleasant and A&O to self, birth date and situation but inconsistent with conversation at times.  He required Max A for bed mobility and Max A +2 for STS which pt was unable to tolerate for >30 sec or maintain NWB status.  Pt fearful of falling and reported feeling "weak" which affected his standing balance and initial sitting balance.  Pt able to assist in return to bed and PT assisted with postioning.  PT noted swelling of L foot/ankle present and removed pt's sock.  Pt open to all education and able to perform there ex with R LE and with L LE when appropriate.  Pt will continue to benefit from skilled PT with focus on strength, tolerance to activity, pain management, balance and safe functional mobility.    Follow Up Recommendations SNF    Equipment Recommendations  None recommended by PT    Recommendations for Other Services       Precautions / Restrictions Precautions Required Braces or Orthoses: Knee Immobilizer - Left Knee Immobilizer - Left: On at all times Restrictions Weight Bearing Restrictions: Yes LLE Weight Bearing: Non weight bearing      Mobility  Bed Mobility Overal bed mobility: Needs Assistance Bed Mobility: Supine to Sit;Sit to Supine     Supine to sit: Max assist;+2 for physical assistance Sit to supine: Mod assist;+2 for physical assistance   General bed mobility comments: Assistance with L LE to slowly  lower to the floor and hand  held assist to bring trunk upright.  +2 to assist LE's back over EOB and and guide trunk towards pillow.  Transfers Overall transfer level: Needs assistance Equipment used: Rolling walker (2 wheeled) Transfers: Sit to/from Stand Sit to Stand: Max assist;+2 physical assistance;From elevated surface         General transfer comment: VC's to avoid WB on L LE with PT's foot placed beneath pt's L foot.  Assistance to rise and to steady pt once upright.  Pt unable to maintain NWB at this time.  Able to stand for 30 sec with heavy assistance but stated that he felt weak.  Ambulation/Gait                Stairs            Wheelchair Mobility    Modified Rankin (Stroke Patients Only)       Balance Overall balance assessment: Needs assistance Sitting-balance support: Feet supported Sitting balance-Leahy Scale: Good     Standing balance support: Bilateral upper extremity supported Standing balance-Leahy Scale: Poor Standing balance comment: Reliance on RW and +2 Max for standing and maintaining NWB status.                             Pertinent Vitals/Pain Pain Assessment: Faces Faces Pain Scale: Hurts little more Pain Location: L LE at surgical site. Pain Descriptors / Indicators: Aching;Guarding;Grimacing Pain Intervention(s):  Limited activity within patient's tolerance;Monitored during session;Ice applied;Repositioned    Home Living Family/patient expects to be discharged to:: Private residence Living Arrangements: Alone Available Help at Discharge: Family;Available PRN/intermittently Type of Home: House Home Access: Stairs to enter   Entrance Stairs-Number of Steps: 3 Home Layout: One level Home Equipment: Walker - 2 wheels      Prior Function Level of Independence: Independent with assistive device(s)         Comments: Pt is sometimes inconsistent with hx but did state that he has a RW in his car for use and uses one for longer distance  walking.     Hand Dominance        Extremity/Trunk Assessment   Upper Extremity Assessment Upper Extremity Assessment: Overall WFL for tasks assessed(UE Grip, elbow flex/ext: 4-/5 bilat.)    Lower Extremity Assessment Lower Extremity Assessment: Overall WFL for tasks assessed(R ankle DF/PF: 4/5, knee flex/ext: 4/5, hip flex: 4-/5.  Sensation: WNL.)    Cervical / Trunk Assessment Cervical / Trunk Assessment: Normal  Communication   Communication: No difficulties(Possibly mildly HOH.)  Cognition Arousal/Alertness: Suspect due to medications Behavior During Therapy: WFL for tasks assessed/performed Overall Cognitive Status: Within Functional Limits for tasks assessed                                 General Comments: Inconsistent with conversation at times but oriented to birthdate, location and situation.  Pt possibly still coming out of anesthesia.      General Comments      Exercises General Exercises - Lower Extremity Ankle Circles/Pumps: Strengthening;10 reps;Supine;Both Quad Sets: Right;Strengthening;10 reps;Supine Gluteal Sets: Strengthening;Both;10 reps;Supine Heel Slides: Strengthening;Right;10 reps;Supine Hip ABduction/ADduction: Strengthening;Both;10 reps;Supine;AAROM Straight Leg Raises: AAROM;Left;5 reps;Supine Other Exercises Other Exercises: Education: benefit of SNF, management of HEP x4 min   Assessment/Plan    PT Assessment Patient needs continued PT services  PT Problem List Decreased strength;Decreased mobility;Decreased activity tolerance;Decreased balance;Decreased range of motion;Decreased knowledge of use of DME;Pain       PT Treatment Interventions DME instruction;Therapeutic activities;Gait training;Therapeutic exercise;Patient/family education;Stair training;Balance training;Functional mobility training    PT Goals (Current goals can be found in the Care Plan section)  Acute Rehab PT Goals Patient Stated Goal: To return home  and to PLOF as soon as possible. PT Goal Formulation: With patient Time For Goal Achievement: 05/28/19 Potential to Achieve Goals: Good    Frequency BID   Barriers to discharge        Co-evaluation               AM-PAC PT "6 Clicks" Mobility  Outcome Measure Help needed turning from your back to your side while in a flat bed without using bedrails?: A Lot Help needed moving from lying on your back to sitting on the side of a flat bed without using bedrails?: A Lot Help needed moving to and from a bed to a chair (including a wheelchair)?: A Lot Help needed standing up from a chair using your arms (e.g., wheelchair or bedside chair)?: A Lot Help needed to walk in hospital room?: Total Help needed climbing 3-5 steps with a railing? : Total 6 Click Score: 10    End of Session Equipment Utilized During Treatment: Gait belt Activity Tolerance: Patient limited by fatigue;Patient limited by pain Patient left: in bed;with call bell/phone within reach;with bed alarm set Nurse Communication: Mobility status PT Visit Diagnosis: Unsteadiness on feet (R26.81);Muscle weakness (generalized) (M62.81);Pain;Other  abnormalities of gait and mobility (R26.89);History of falling (Z91.81) Pain - Right/Left: Left Pain - part of body: Leg    Time: [id number] PT Time Calculation (min) (ACUTE ONLY): 34 min   Charges:   PT Evaluation $PT Eval Low Complexity: 1 Low PT Treatments $Therapeutic Exercise: 8-22 mins        Roxanne Gates, PT, DPT   Roxanne Gates 05/14/2019, 10:04 AM

## 2019-05-14 NOTE — TOC Initial Note (Signed)
Transition of Care [facility]) - Initial/Assessment Note    Patient Details  Name: Edward Strong MRN: [id number] Date of Birth: [birth year]  Transition of Care [facility]) CM/SW Contact:    Edward Hilt, RN Phone Number: 05/14/2019, 2:05 PM  Clinical Narrative:                 Met with the patient at there bedside to discuss DC plan and needs He lives at home alone and has a RW at home He prefers to go home but living alone he does not have anyone that could stay with him, He is agreeable to do a bed search and will discuss options once bed offers are obtained His Son Edward Strong is out of the room but will be back and will include in the discussion once bed offers are in. FL2 completed, PASSr obtained, bed search sent  Expected Discharge Plan: Skilled Nursing Facility Barriers to Discharge: Continued Medical Work up, SNF Pending bed offer   Patient Goals and CMS Choice Patient states their goals for this hospitalization and ongoing recovery are:: go home      Expected Discharge Plan and Services Expected Discharge Plan: [place]   Discharge Planning Services: CM Consult   Living arrangements for the past 2 months: Single Family Home                 DME Arranged: N/A         HH Arranged: NA          Prior Living Arrangements/Services Living arrangements for the past 2 months: Single Family Home Lives with:: Self Patient language and need for interpreter reviewed:: Yes Do you feel safe going back to the place where you live?: Yes      Need for Family Participation in Patient Care: No (Comment) Care giver support system in place?: No (comment) Current home services: DME(RW) Criminal Activity/Legal Involvement Pertinent to Current Situation/Hospitalization: No - Comment as needed  Activities of Daily Living Home Assistive Devices/Equipment: None ADL Screening (condition at time of admission) Patient's cognitive ability adequate to safely complete daily  activities?: No Is the patient deaf or have difficulty hearing?: No Does the patient have difficulty seeing, even when wearing glasses/contacts?: No Does the patient have difficulty concentrating, remembering, or making decisions?: Yes Patient able to express need for assistance with ADLs?: Yes Does the patient have difficulty dressing or bathing?: Yes Independently performs ADLs?: No Communication: Needs assistance Dressing (OT): Needs assistance Grooming: Needs assistance Feeding: Needs assistance Bathing: Needs assistance Toileting: Needs assistance In/Out Bed: Needs assistance Walks in Home: Needs assistance Does the patient have difficulty walking or climbing stairs?: Yes Weakness of Legs: Both Weakness of Arms/Hands: Both  Permission Sought/Granted   Permission granted to share information with : Yes, Verbal Permission Granted              Emotional Assessment Appearance:: Appears stated age Attitude/Demeanor/Rapport: Engaged Affect (typically observed): Appropriate Orientation: : Oriented to Self, Oriented to Place, Oriented to  Time, Oriented to Situation Alcohol / Substance Use: Not Applicable Psych Involvement: No (comment)  Admission diagnosis:  Closed fracture of left distal femur ([place]) [S72.402A] Closed fracture of distal end of left femur, unspecified fracture morphology, initial encounter [facility]) [S72.402A] Patient Active Problem List   Diagnosis Date Noted  . Closed fracture of left distal femur ([place]) 05/12/2019  . CAD (coronary artery disease) 05/12/2019  . Chronic systolic CHF (congestive heart failure) ([place]) 05/12/2019  . Leukocytosis 05/12/2019  . Type  II diabetes mellitus with renal manifestations ([place])   . HTN (hypertension)   . CKD (chronic kidney disease), stage IIIa   . Atrial fibrillation ([place])    PCP:  Patient, No Pcp Per Pharmacy:   [id number] [place] [id number]: [phone]:  [phone]    Social Determinants of Health (SDOH) Interventions    Readmission Risk Interventions No flowsheet data found.

## 2019-05-14 NOTE — Progress Notes (Signed)
Physical Therapy Treatment Patient Details Name: Edward Strong MRN: [id number] DOB: 09-05-1929 Today's Date: 05/14/2019    History of Present Illness Pt is n 86 year old M admitted s/pORIF of L distal femur following a periprosthetic supracondylar fracture when pt caught his L LE in a plastic chair at home.  [place] includes CVA, CKD III, atrial fibrillation and Hodgkins.    PT Comments    Pt still alert but with intermittent attention and decision making this p.m.  He was willing to perform supine there ex but was hesitant to attempt any mobility.  Pt tearful and appeared anxious at the mention of discharging to SNF, PT took time to comfort and discuss benefit.  Pt repeated several times "I don't know what's wrong with me" and "I guess I took life for granted", but refused when asked if he would like to speak with a Chaplain.  Care management also in to discuss discharge planning with pt during this time.  He was able to perform most supine there ex with VC's or manual assist to set up and sometimes complete full range.  Pt reported pain in L LE and stated that he understands the importance of frequent mobility.  Pt still with knee immobilizer donned and locked in extension at end of session and PT assisted with positioning for comfort.  Pt will continue to benefit from skilled PT with focus on strength, tolerance to activity and pain management.  Follow Up Recommendations  SNF     Equipment Recommendations  None recommended by PT    Recommendations for Other Services       Precautions / Restrictions Precautions Required Braces or Orthoses: Knee Immobilizer - Left Knee Immobilizer - Left: On at all times Restrictions Weight Bearing Restrictions: Yes LLE Weight Bearing: Non weight bearing    Mobility  Bed Mobility Overal bed mobility: (Deferred)                Transfers                    Ambulation/Gait                 Stairs             Wheelchair  Mobility    Modified Rankin (Stroke Patients Only)       Balance                                            Cognition Arousal/Alertness: Suspect due to medications Behavior During Therapy: WFL for tasks assessed/performed Overall Cognitive Status: Within Functional Limits for tasks assessed                                 General Comments: Pt still inconsistent with conversation and with difficulty decision making.  Answered phone to talk to care management and then hung up, stating that he thought it was a sales person.      Exercises General Exercises - Lower Extremity Ankle Circles/Pumps: Strengthening;10 reps;Supine;Both Quad Sets: Right;Strengthening;Supine;15 reps Gluteal Sets: Strengthening;Both;Supine;15 reps Heel Slides: Strengthening;Right;10 reps;Supine Hip ABduction/ADduction: Strengthening;Both;Supine;AAROM;15 reps Straight Leg Raises: AAROM;5 reps;Supine;Both Other Exercises Other Exercises: Education: benefit of SNF related to support system at home.  Care mangement in to talk to pt. Time to comfort pt who appears emotional and anxious  this p.m.  x10 min    General Comments        Pertinent Vitals/Pain Pain Assessment: Faces Faces Pain Scale: Hurts little more Pain Location: L LE at surgical site. Pain Descriptors / Indicators: Guarding;Grimacing Pain Intervention(s): Limited activity within patient's tolerance;Monitored during session    Home Living                      Prior Function            PT Goals (current goals can now be found in the care plan section) Acute Rehab PT Goals Patient Stated Goal: To return home and to PLOF as soon as possible. PT Goal Formulation: With patient Time For Goal Achievement: 05/28/19 Potential to Achieve Goals: Good    Frequency    BID      PT Plan      Co-evaluation              AM-PAC PT "6 Clicks" Mobility   Outcome Measure  Help needed turning  from your back to your side while in a flat bed without using bedrails?: A Lot Help needed moving from lying on your back to sitting on the side of a flat bed without using bedrails?: A Lot Help needed moving to and from a bed to a chair (including a wheelchair)?: A Lot Help needed standing up from a chair using your arms (e.g., wheelchair or bedside chair)?: A Lot Help needed to walk in hospital room?: Total Help needed climbing 3-5 steps with a railing? : Total 6 Click Score: 10    End of Session Equipment Utilized During Treatment: Gait belt Activity Tolerance: Patient limited by fatigue;Patient limited by pain Patient left: in bed;with call bell/phone within reach;with bed alarm set Nurse Communication: Mobility status PT Visit Diagnosis: Unsteadiness on feet (R26.81);Muscle weakness (generalized) (M62.81);Pain;Other abnormalities of gait and mobility (R26.89);History of falling (Z91.81) Pain - Right/Left: Left Pain - part of body: Leg     Time: [id number] PT Time Calculation (min) (ACUTE ONLY): 26 min  Charges:  $Therapeutic Exercise: 8-22 mins $Therapeutic Activity: 8-22 mins                     Roxanne Gates, PT, DPT    Roxanne Gates 05/14/2019, 3:14 PM

## 2019-05-14 NOTE — Progress Notes (Signed)
  Subjective: 1 Day Post-Op Procedure(s) (LRB): OPEN REDUCTION INTERNAL FIXATION (ORIF) of closed  left periprosthetic supracondylar femur fracture. (Left) Patient reports pain as mild.   Patient is well, and has had no acute complaints or problems PT and care management to assist with discharge, will likely need SNF. Negative for chest pain and shortness of breath Fever: no Gastrointestinal:Negative for nausea and vomiting  Objective: Vital signs in last 24 hours: Temp:  [97.2 F (36.2 C)-98.1 F (36.7 C)] 97.6 F (36.4 C) (01/06 0723) Pulse Rate:  [68-93] 68 (01/06 0723) Resp:  [15-20] 17 (01/06 0723) BP: (101-136)/(51-87) 117/64 (01/06 0723) SpO2:  [93 %-100 %] 97 % (01/06 0723) Weight:  [104.3 kg] 104.3 kg (01/05 1153)  Intake/Output from previous day:  Intake/Output Summary (Last 24 hours) at 05/14/2019 1115 Last data filed at 05/14/2019 0900 Gross per 24 hour  Intake 1820 ml  Output 1475 ml  Net 345 ml    Intake/Output this shift: Total I/O In: 780 [P.O.:780] Out: -   Labs: Recent Labs    05/12/19 1222 05/14/19 0446  HGB 13.5 9.4*   Recent Labs    05/12/19 1222 05/14/19 0446  WBC 17.4* 13.5*  RBC 4.12* 2.81*  HCT 38.7* 28.3*  PLT 178 144*   Recent Labs    05/12/19 1222 05/14/19 0446  NA 138 135  K 4.4 4.4  CL 105 106  CO2 20* 22  BUN 41* 45*  CREATININE 1.53* 1.76*  GLUCOSE 206* 178*  CALCIUM 9.4 8.1*   Recent Labs    05/12/19 1222  INR 1.4*     EXAM General - Patient is Alert, Appropriate and Oriented Extremity - ABD soft Sensation intact distally Dorsiflexion/Plantar flexion intact  Lateral leg incisions with minimal bloody drainage, no erythema. Compartments to the left leg are soft. Dressing/Incision - ACE wrap and Knee ROM brace intact. Motor Function - intact, moving foot and toes well on exam.   Past Medical History:  Diagnosis Date  . Atrial fibrillation ([place])   . CKD (chronic kidney disease), stage IIIa   . HTN  (hypertension)   . Type II diabetes mellitus with renal manifestations (HCC)     Assessment/Plan: 1 Day Post-Op Procedure(s) (LRB): OPEN REDUCTION INTERNAL FIXATION (ORIF) of closed  left periprosthetic supracondylar femur fracture. (Left) Principal Problem:   Closed fracture of left distal femur (HCC) Active Problems:   Type II diabetes mellitus with renal manifestations (HCC)   HTN (hypertension)   CKD (chronic kidney disease), stage IIIa   Atrial fibrillation (HCC)   CAD (coronary artery disease)   Chronic systolic CHF (congestive heart failure) (HCC)   Leukocytosis  Estimated body mass index is 32.07 kg/m as calculated from the following:   Height as of this encounter: 5\' 11"  (1.803 m).   Weight as of this encounter: 104.3 kg. Advance diet Up with therapy D/C IV fluids when tolerating po intake.  Labs reviewed this AM.  WBC 13.5.   Hg 9.4.   Continue with PT, NWB to the left leg. Can work on very gentle motion to the left knee. Begin working on BM. CBC and BMP ordered for tomorrow morning.  DVT Prophylaxis - Eliquis Non Weight-Bearing to the left leg  J. Cameron Proud, PA-C [facility] 05/14/2019, 11:15 AM

## 2019-05-14 NOTE — NC FL2 (Signed)
[place] LEVEL OF CARE SCREENING TOOL     IDENTIFICATION  Patient Name: Edward Strong Birthdate: [birth year] Sex: male Admission Date (Current Location): 05/12/2019  [place] and [place] Number:  Engineering geologist and Address:  [facility], [address], [place], [place] [address]      Provider Number: [id number]  Attending Physician Name and Address:  Louellen Molder, MD  Relative Name and Phone Number:  Rogelia Boga [phone]    Current Level of Care: Hospital Recommended Level of Care: [place] Prior Approval Number:    Date Approved/Denied:   PASRR Number: [id number]  Discharge Plan: SNF    Current Diagnoses: Patient Active Problem List   Diagnosis Date Noted  . Closed fracture of left distal femur ([place]) 05/12/2019  . CAD (coronary artery disease) 05/12/2019  . Chronic systolic CHF (congestive heart failure) ([place]) 05/12/2019  . Leukocytosis 05/12/2019  . Type II diabetes mellitus with renal manifestations ([place])   . HTN (hypertension)   . CKD (chronic kidney disease), stage IIIa   . Atrial fibrillation (HCC)     Orientation RESPIRATION BLADDER Height & Weight     Self, Time, Situation, Place  Normal Continent Weight: 104.3 kg Height:  5\' 11"  (180.3 cm)  BEHAVIORAL SYMPTOMS/MOOD NEUROLOGICAL BOWEL NUTRITION STATUS      Continent Diet  AMBULATORY STATUS COMMUNICATION OF NEEDS Skin   Extensive Assist Verbally Surgical wounds                       Personal Care Assistance Level of Assistance  Dressing, Bathing Bathing Assistance: Limited assistance   Dressing Assistance: Limited assistance     Functional Limitations Info  Hearing   Hearing Info: Impaired      SPECIAL CARE FACTORS FREQUENCY  PT (By licensed PT)     PT Frequency: 5 times per week              Contractures Contractures Info: Not present    Additional Factors Info  Allergies   Allergies Info: Bee Venom,  Ezetimibe, Fenofibrate Micronized, Pravastatin, Sulfa Antibiotics           Current Medications (05/14/2019):  This is the current hospital active medication list Current Facility-Administered Medications  Medication Dose Route Frequency Provider Last Rate Last Admin  . 0.9 %  sodium chloride infusion   Intravenous Continuous Poggi, Marshall Cork, MD 75 mL/hr at 05/14/19 1025 New Bag at 05/14/19 1025  . acetaminophen (TYLENOL) tablet 1,000 mg  1,000 mg Oral Q6H Poggi, Marshall Cork, MD   1,000 mg at 05/14/19 0915  . apixaban (ELIQUIS) tablet 2.5 mg  2.5 mg Oral BID Poggi, Marshall Cork, MD   2.5 mg at 05/14/19 1027  . aspirin chewable tablet 81 mg  81 mg Oral Daily Poggi, Marshall Cork, MD   81 mg at 05/14/19 1027  . bisacodyl (DULCOLAX) suppository 10 mg  10 mg Rectal Daily PRN Poggi, Marshall Cork, MD      . diphenhydrAMINE (BENADRYL) 12.5 MG/5ML elixir 12.5-25 mg  12.5-25 mg Oral Q4H PRN Poggi, Marshall Cork, MD      . docusate sodium (COLACE) capsule 100 mg  100 mg Oral BID Poggi, Marshall Cork, MD   100 mg at 05/14/19 1027  . furosemide (LASIX) tablet 40 mg  40 mg Oral Daily Poggi, Marshall Cork, MD   40 mg at 05/14/19 1027  . hydrALAZINE (APRESOLINE) tablet 25 mg  25 mg Oral TID PRN Milagros Evener  J, MD      . insulin aspart (novoLOG) injection 0-15 Units  0-15 Units Subcutaneous Q4H Poggi, Marshall Cork, MD   3 Units at 05/14/19 1226  . insulin detemir (LEVEMIR) injection 30 Units  30 Units Subcutaneous Daily Poggi, Marshall Cork, MD   30 Units at 05/14/19 1036  . losartan (COZAAR) tablet 100 mg  100 mg Oral Daily Poggi, Marshall Cork, MD   100 mg at 05/14/19 1026  . magnesium hydroxide (MILK OF MAGNESIA) suspension 30 mL  30 mL Oral Daily PRN Poggi, Marshall Cork, MD      . methocarbamol (ROBAXIN) tablet 500 mg  500 mg Oral Q8H PRN Poggi, Marshall Cork, MD      . metoCLOPramide (REGLAN) tablet 5-10 mg  5-10 mg Oral Q8H PRN Poggi, Marshall Cork, MD       Or  . metoCLOPramide (REGLAN) injection 5-10 mg  5-10 mg Intravenous Q8H PRN Poggi, Marshall Cork, MD      . morphine 2 MG/ML injection 2  mg  2 mg Intravenous Q3H PRN Poggi, Marshall Cork, MD      . ondansetron (ZOFRAN) tablet 4 mg  4 mg Oral Q6H PRN Poggi, Marshall Cork, MD       Or  . ondansetron (ZOFRAN) injection 4 mg  4 mg Intravenous Q6H PRN Poggi, Marshall Cork, MD      . oxyCODONE (Oxy IR/ROXICODONE) immediate release tablet 5-10 mg  5-10 mg Oral Q4H PRN Poggi, Marshall Cork, MD      . polyethylene glycol (MIRALAX / GLYCOLAX) packet 17 g  17 g Oral Daily Dhungel, Nishant, MD      . protein supplement (ENSURE MAX) liquid  11 oz Oral BID BM Lavina Hamman, MD      . senna-docusate (Senokot-S) tablet 1 tablet  1 tablet Oral QHS PRN Poggi, Marshall Cork, MD      . sodium phosphate (FLEET) 7-19 GM/118ML enema 1 enema  1 enema Rectal Once PRN Poggi, Marshall Cork, MD      . traMADol Veatrice Bourbon) tablet 50 mg  50 mg Oral Q6H Poggi, Marshall Cork, MD   50 mg at 05/14/19 1226     Discharge Medications: Please see discharge summary for a list of discharge medications.  Relevant Imaging Results:  Relevant Lab Results:   Additional Information SS# [id number]  Su Hilt, RN

## 2019-05-14 NOTE — Plan of Care (Signed)
  Problem: Education: Goal: Knowledge of General Education information will improve Description: Including pain rating scale, medication(s)/side effects and non-pharmacologic comfort measures Outcome: Progressing   Problem: Health Behavior/Discharge Planning: Goal: Ability to manage health-related needs will improve Outcome: Progressing   Problem: Clinical Measurements: Goal: Ability to maintain clinical measurements within normal limits will improve Outcome: Progressing Goal: Will remain free from infection Outcome: Progressing Goal: Diagnostic test results will improve Outcome: Progressing Goal: Respiratory complications will improve Outcome: Progressing Goal: Cardiovascular complication will be avoided Outcome: Progressing   Problem: Activity: Goal: Risk for activity intolerance will decrease Outcome: Progressing   Problem: Coping: Goal: Level of anxiety will decrease Outcome: Progressing   Problem: Elimination: Goal: Will not experience complications related to bowel motility Outcome: Progressing Goal: Will not experience complications related to urinary retention Outcome: Progressing   Problem: Safety: Goal: Ability to remain free from injury will improve Outcome: Progressing   Problem: Education: Goal: Individualized Educational Video(s) Outcome: Progressing   Problem: Activity: Goal: Ability to ambulate and perform ADLs will improve Outcome: Progressing   Problem: Clinical Measurements: Goal: Postoperative complications will be avoided or minimized Outcome: Progressing   Problem: Self-Concept: Goal: Ability to maintain and perform role responsibilities to the fullest extent possible will improve Outcome: Progressing   Problem: Pain Management: Goal: Pain level will decrease Outcome: Progressing

## 2019-05-14 NOTE — Progress Notes (Signed)
Pt states he feels "bad" ,bp soft 92/52. Notified md. cbg 239. Cont to [facility].

## 2019-05-15 DIAGNOSIS — D62 Acute posthemorrhagic anemia: Secondary | ICD-10-CM

## 2019-05-15 DIAGNOSIS — I9589 Other hypotension: Secondary | ICD-10-CM

## 2019-05-15 DIAGNOSIS — N179 Acute kidney failure, unspecified: Secondary | ICD-10-CM

## 2019-05-15 DIAGNOSIS — E861 Hypovolemia: Secondary | ICD-10-CM

## 2019-05-15 LAB — GLUCOSE, CAPILLARY
Glucose-Capillary: 127 mg/dL — ABNORMAL HIGH (ref 70–99)
Glucose-Capillary: 128 mg/dL — ABNORMAL HIGH (ref 70–99)
Glucose-Capillary: 172 mg/dL — ABNORMAL HIGH (ref 70–99)
Glucose-Capillary: 176 mg/dL — ABNORMAL HIGH (ref 70–99)
Glucose-Capillary: 191 mg/dL — ABNORMAL HIGH (ref 70–99)
Glucose-Capillary: 203 mg/dL — ABNORMAL HIGH (ref 70–99)
Glucose-Capillary: 208 mg/dL — ABNORMAL HIGH (ref 70–99)

## 2019-05-15 LAB — CBC
HCT: 24.9 % — ABNORMAL LOW (ref 39.0–52.0)
Hemoglobin: 8.1 g/dL — ABNORMAL LOW (ref 13.0–17.0)
MCH: 33.6 pg (ref 26.0–34.0)
MCHC: 32.5 g/dL (ref 30.0–36.0)
MCV: 103.3 fL — ABNORMAL HIGH (ref 80.0–100.0)
Platelets: 134 10*3/uL — ABNORMAL LOW (ref 150–400)
RBC: 2.41 MIL/uL — ABNORMAL LOW (ref 4.22–5.81)
RDW: 13 % (ref 11.5–15.5)
WBC: 11.1 10*3/uL — ABNORMAL HIGH (ref 4.0–10.5)
nRBC: 0 % (ref 0.0–0.2)

## 2019-05-15 LAB — BASIC METABOLIC PANEL
Anion gap: 8 (ref 5–15)
BUN: 52 mg/dL — ABNORMAL HIGH (ref 8–23)
CO2: 22 mmol/L (ref 22–32)
Calcium: 7.9 mg/dL — ABNORMAL LOW (ref 8.9–10.3)
Chloride: 107 mmol/L (ref 98–111)
Creatinine, Ser: 2.21 mg/dL — ABNORMAL HIGH (ref 0.61–1.24)
GFR calc Af Amer: 30 mL/min — ABNORMAL LOW (ref >60)
GFR calc non Af Amer: 26 mL/min — ABNORMAL LOW (ref >60)
Glucose, Bld: 145 mg/dL — ABNORMAL HIGH (ref 70–99)
Potassium: 4.5 mmol/L (ref 3.5–5.1)
Sodium: 137 mmol/L (ref 135–145)

## 2019-05-15 MED ORDER — OXYCODONE HCL 5 MG PO TABS: 5.0000 mg | ORAL_TABLET | ORAL | 0 refills | Status: DC | PRN

## 2019-05-15 NOTE — Progress Notes (Signed)
PROGRESS NOTE                                                                                                                                                                                                             Patient Demographics:    Edward Strong, is a 86 y.o. male, DOB - October 23, 1929, [id number]  Admit date - 05/12/2019   Admitting Physician Ivor Costa, MD  Outpatient Primary MD for the patient is Patient, No Pcp Per  LOS - 3    Chief Complaint  Patient presents with  . Knee Pain       Brief Narrative   86 year old male with history of CAD, stroke, diastolic CHF, Hodgkin's lymphoma, A. fib on Eliquis, CKD stage III, hypertension, diabetes mellitus who presented with closed left distal (supracondylar) femur fracture and underwent ORIF.    Subjective:   Patient reports pain to be well controlled on current meds.   Assessment  & Plan :    Principal Problem:   Closed fracture of left distal femur (HCC) S/p ORIF of closed left periprosthetic supracondylar femur fracture. Pain control with as needed Percocet.  Added bowel regimen.  PT/OT.  Needs SNF as patient lives alone. Eliquis resumed.  Monitor with drop in hemoglobin. Orthopedics recommends nonweightbearing on left leg.  Active Problems:   Type II diabetes mellitus with renal manifestations (HCC) Stable.  Continue NPH and sliding scale coverage.   Essential HTN (hypertension) Low normal blood pressure.  Lasix and Cozaar held.  Gentle hydration as patient complaining of feeling dizzy.   Acute kidney injury on CKD (chronic kidney disease), stage IIIa Suspect prerenal with dehydration and blood loss anemia.  Hold Lasix and ARB.  Gentle hydration.    Atrial fibrillation, paroxysmal (HCC) Rate controlled.  Continue Eliquis.     Chronic systolic CHF (congestive heart failure) (HCC) Euvolemic.  Continue Lasix  Acute postoperative blood loss  anemia Monitor H&H.  Transfusion not needed.  Check iron panel.  Continue Eliquis.    Code Status : Full code  Family Communication  : Son at bedside  Disposition Plan  : SNF possibly tomorrow if renal function, blood pressure and hemoglobin stable.  Barriers For Discharge : Postoperative, active symptoms  Consults  : Orthopedic  Procedures  : ORIF left femur  DVT Prophylaxis  : Eliquis  Lab Results  Component Value Date   PLT 134 (L) 05/15/2019    Antibiotics  :   Anti-infectives (From admission, onward)   Start     Dose/Rate Route Frequency Ordered Stop   05/13/19 1900  ceFAZolin (ANCEF) IVPB 2g/100 mL premix     2 g 200 mL/hr over 30 Minutes Intravenous Every 6 hours 05/13/19 1653 05/14/19 1056   05/13/19 1400  ceFAZolin (ANCEF) IVPB 2g/100 mL premix     2 g 200 mL/hr over 30 Minutes Intravenous  Once 05/12/19 1129 05/13/19 1304   05/13/19 1201  ceFAZolin (ANCEF) 2-4 GM/100ML-% IVPB    Note to Pharmacy: Nyra Jabs   : cabinet override      05/13/19 1201 05/13/19 1324        Objective:   Vitals:   05/14/19 1740 05/14/19 2014 05/15/19 0110 05/15/19 0752  BP: (!) 92/52 (!) 105/43 (!) 98/49 113/69  Pulse: 64 (!) 51 72 64  Resp:   17 18  Temp:  98 F (36.7 C) 98.6 F (37 C) 98 F (36.7 C)  TempSrc:  Oral Oral   SpO2:  95% 94% 93%  Weight:      Height:        Wt Readings from Last 3 Encounters:  05/13/19 104.3 kg     Intake/Output Summary (Last 24 hours) at 05/15/2019 1039 Last data filed at 05/15/2019 0730 Gross per 24 hour  Intake 240 ml  Output 630 ml  Net -390 ml    Physical exam Elderly male not in distress HEENT: Moist mucosa, supple neck Chest: Clear CVs: Normal S1-S2 GI: Soft, nondistended nontender Musculoskeletal: Warm, no edema, dressing over left hip with Ace wrap and brace in place      Data Review:    CBC Recent Labs  Lab 05/12/19 1222 05/14/19 0446 05/15/19 0341  WBC 17.4* 13.5* 11.1*  HGB 13.5 9.4* 8.1*  HCT  38.7* 28.3* 24.9*  PLT 178 144* 134*  MCV 93.9 100.7* 103.3*  MCH 32.8 33.5 33.6  MCHC 34.9 33.2 32.5  RDW 12.8 13.0 13.0  LYMPHSABS 0.8  --   --   MONOABS 0.9  --   --   EOSABS 0.0  --   --   BASOSABS 0.1  --   --     Chemistries  Recent Labs  Lab 05/12/19 1222 05/14/19 0446 05/15/19 0341  NA 138 135 137  K 4.4 4.4 4.5  CL 105 106 107  CO2 20* 22 22  GLUCOSE 206* 178* 145*  BUN 41* 45* 52*  CREATININE 1.53* 1.76* 2.21*  CALCIUM 9.4 8.1* 7.9*  AST 28  --   --   ALT 19  --   --   ALKPHOS 63  --   --   BILITOT 1.3*  --   --    ------------------------------------------------------------------------------------------------------------------ No results for input(s): CHOL, HDL, LDLCALC, TRIG, CHOLHDL, LDLDIRECT in the last 72 hours.  Lab Results  Component Value Date   HGBA1C 7.8 (H) 05/12/2019   ------------------------------------------------------------------------------------------------------------------ No results for input(s): TSH, T4TOTAL, T3FREE, THYROIDAB in the last 72 hours.  Invalid input(s): FREET3 ------------------------------------------------------------------------------------------------------------------ No results for input(s): VITAMINB12, FOLATE, FERRITIN, TIBC, IRON, RETICCTPCT in the last 72 hours.  Coagulation profile Recent Labs  Lab 05/12/19 1222  INR 1.4*    No results for input(s): DDIMER in the last 72 hours.  Cardiac Enzymes No results for input(s): CKMB, TROPONINI, MYOGLOBIN in the last 168 hours.  Invalid input(s): CK ------------------------------------------------------------------------------------------------------------------    Component Value Date/Time  BNP 107.0 (H) 05/12/2019 1230    Inpatient Medications  Scheduled Meds: . apixaban  2.5 mg Oral BID  . aspirin  81 mg Oral Daily  . docusate sodium  100 mg Oral BID  . insulin aspart  0-15 Units Subcutaneous Q4H  . insulin detemir  30 Units Subcutaneous Daily   . polyethylene glycol  17 g Oral Daily  . Ensure Max Protein  11 oz Oral BID BM  . traMADol  50 mg Oral Q6H   Continuous Infusions: . sodium chloride 75 mL/hr at 05/15/19 0858   PRN Meds:.bisacodyl, diphenhydrAMINE, hydrALAZINE, magnesium hydroxide, methocarbamol, metoCLOPramide **OR** metoCLOPramide (REGLAN) injection, morphine injection, ondansetron **OR** ondansetron (ZOFRAN) IV, oxyCODONE, senna-docusate, sodium phosphate  Micro Results Recent Results (from the past 240 hour(s))  Respiratory Panel by RT PCR (Flu A&B, Covid) - Nasopharyngeal Swab     Status: None   Collection Time: 05/12/19 12:23 PM   Specimen: Nasopharyngeal Swab  Result Value Ref Range Status   SARS Coronavirus 2 by RT PCR NEGATIVE NEGATIVE Final    Comment: (NOTE) SARS-CoV-2 target nucleic acids are NOT DETECTED. The SARS-CoV-2 RNA is generally detectable in upper respiratoy specimens during the acute phase of infection. The lowest concentration of SARS-CoV-2 viral copies this assay can detect is 131 copies/mL. A negative result does not preclude SARS-Cov-2 infection and should not be used as the sole basis for treatment or other patient management decisions. A negative result may occur with  improper specimen collection/handling, submission of specimen other than nasopharyngeal swab, presence of viral mutation(s) within the areas targeted by this assay, and inadequate number of viral copies (<131 copies/mL). A negative result must be combined with clinical observations, patient history, and epidemiological information. The expected result is Negative. Fact Sheet for Patients:  PinkCheek.be Fact Sheet for Healthcare Providers:  GravelBags.it This test is not yet ap proved or cleared by the [place] FDA and  has been authorized for detection and/or diagnosis of SARS-CoV-2 by FDA under an Emergency Use Authorization (EUA). This EUA will remain   in effect (meaning this test can be used) for the duration of the COVID-19 declaration under Section 564(b)(1) of the Act, 21 U.S.C. section 360bbb-3(b)(1), unless the authorization is terminated or revoked sooner.    Influenza A by PCR NEGATIVE NEGATIVE Final   Influenza B by PCR NEGATIVE NEGATIVE Final    Comment: (NOTE) The Xpert Xpress SARS-CoV-2/FLU/RSV assay is intended as an aid in  the diagnosis of influenza from Nasopharyngeal swab specimens and  should not be used as a sole basis for treatment. Nasal washings and  aspirates are unacceptable for Xpert Xpress SARS-CoV-2/FLU/RSV  testing. Fact Sheet for Patients: PinkCheek.be Fact Sheet for Healthcare Providers: GravelBags.it This test is not yet approved or cleared by the [place] FDA and  has been authorized for detection and/or diagnosis of SARS-CoV-2 by  FDA under an Emergency Use Authorization (EUA). This EUA will remain  in effect (meaning this test can be used) for the duration of the  Covid-19 declaration under Section 564(b)(1) of the Act, 21  U.S.C. section 360bbb-3(b)(1), unless the authorization is  terminated or revoked. Performed at [facility], [address], [place], [place] [address]     Radiology Reports DG Chest Portable 1 View  Result Date: 05/12/2019 CLINICAL DATA:  Preoperative examination for patient with a distal left femur fracture suffered in a fall today. EXAM: PORTABLE CHEST 1 VIEW COMPARISON:  None. FINDINGS: There is volume loss in the right chest with  suture material present. Calcified granulomata are seen in the right lung and a calcified right hilar lymph node is also noted. Lungs are otherwise clear. No pneumothorax or pleural effusion. Heart size is normal. Atherosclerosis is seen. IMPRESSION: No acute disease. Atherosclerosis. Electronically Signed   By: Inge Rise M.D.   On: 05/12/2019 12:03   DG Knee Complete  4 Views Left  Result Date: 05/12/2019 CLINICAL DATA:  Left knee pain after falling at home today. EXAM: LEFT KNEE - COMPLETE 4+ VIEW COMPARISON:  None. FINDINGS: Status post total knee arthroplasty. There is a comminuted and moderately displaced fracture of the distal femur just proximal to the femoral component of the arthroplasty. This fracture demonstrates up to 2.1 cm of posterior and 4.3 cm of proximal displacement. No dislocation. The proximal tibia and fibula are intact. Diffuse vascular calcifications are noted. IMPRESSION: Comminuted and moderately displaced fracture of the distal femur just proximal to the femoral component of the total knee arthroplasty. Electronically Signed   By: Richardean Sale M.D.   On: 05/12/2019 10:45   DG C-Arm 1-60 Min  Result Date: 05/13/2019 CLINICAL DATA:  ORIF of distal LEFT femoral fracture EXAM: LEFT FEMUR 2 VIEWS; DG C-ARM 1-60 MIN COMPARISON:  05/12/2019 FLUOROSCOPY TIME:  1 minutes 1 second Images obtained: 4 FINDINGS: Images demonstrate placement of a lateral plate and multiple screws across a reduced fracture of the distal LEFT femoral metadiaphysis. Bones appear demineralized. Cerclage wire present. LEFT knee prosthesis seen. No additional osseous abnormalities identified. IMPRESSION: Post ORIF of previously identified distal LEFT femoral metadiaphyseal fracture. Electronically Signed   By: Lavonia Dana M.D.   On: 05/13/2019 15:03   DG FEMUR MIN 2 VIEWS LEFT  Result Date: 05/13/2019 CLINICAL DATA:  ORIF of distal LEFT femoral fracture EXAM: LEFT FEMUR 2 VIEWS; DG C-ARM 1-60 MIN COMPARISON:  05/12/2019 FLUOROSCOPY TIME:  1 minutes 1 second Images obtained: 4 FINDINGS: Images demonstrate placement of a lateral plate and multiple screws across a reduced fracture of the distal LEFT femoral metadiaphysis. Bones appear demineralized. Cerclage wire present. LEFT knee prosthesis seen. No additional osseous abnormalities identified. IMPRESSION: Post ORIF of previously  identified distal LEFT femoral metadiaphyseal fracture. Electronically Signed   By: Lavonia Dana M.D.   On: 05/13/2019 15:03    Time Spent in minutes  35   Audrey Thull M.D on 05/15/2019 at 10:39 AM  Between 7am to 7pm - Pager - [phone]  After 7pm go to [email or web address] - password [facility]  [facility] -  Office  [phone]

## 2019-05-15 NOTE — Progress Notes (Signed)
Subjective: 2 Days Post-Op Procedure(s) (LRB): OPEN REDUCTION INTERNAL FIXATION (ORIF) of closed  left periprosthetic supracondylar femur fracture. (Left) Patient reports pain as mild.   Patient is well, and has had no acute complaints or problems Plan for d/c to SNF, patient has chosen [facility] for discharge. Negative for chest pain and shortness of breath Fever: no Gastrointestinal:Negative for nausea and vomiting  Objective: Vital signs in last 24 hours: Temp:  [97.6 F (36.4 C)-98.6 F (37 C)] 98 F (36.7 C) (01/07 0752) Pulse Rate:  [51-72] 64 (01/07 0752) Resp:  [17-18] 18 (01/07 0752) BP: (92-117)/(43-69) 113/69 (01/07 0752) SpO2:  [93 %-95 %] 93 % (01/07 0752)  Intake/Output from previous day:  Intake/Output Summary (Last 24 hours) at 05/15/2019 0954 Last data filed at 05/15/2019 0730 Gross per 24 hour  Intake 240 ml  Output 630 ml  Net -390 ml    Intake/Output this shift: Total I/O In: -  Out: 200 [Urine:200]  Labs: Recent Labs    05/12/19 1222 05/14/19 0446 05/15/19 0341  HGB 13.5 9.4* 8.1*   Recent Labs    05/14/19 0446 05/15/19 0341  WBC 13.5* 11.1*  RBC 2.81* 2.41*  HCT 28.3* 24.9*  PLT 144* 134*   Recent Labs    05/14/19 0446 05/15/19 0341  NA 135 137  K 4.4 4.5  CL 106 107  CO2 22 22  BUN 45* 52*  CREATININE 1.76* 2.21*  GLUCOSE 178* 145*  CALCIUM 8.1* 7.9*   Recent Labs    05/12/19 1222  INR 1.4*     EXAM General - Patient is Alert, Appropriate and Oriented Extremity - ABD soft Sensation intact distally Dorsiflexion/Plantar flexion intact  Lateral leg incisions with minimal bloody drainage, no erythema. Compartments to the left leg are soft. Dressing/Incision - ACE wrap and Knee ROM brace intact. Motor Function - intact, moving foot and toes well on exam.   Past Medical History:  Diagnosis Date  . Atrial fibrillation ([place])   . CKD (chronic kidney disease), stage IIIa   . HTN (hypertension)   . Type II diabetes mellitus  with renal manifestations (HCC)     Assessment/Plan: 2 Days Post-Op Procedure(s) (LRB): OPEN REDUCTION INTERNAL FIXATION (ORIF) of closed  left periprosthetic supracondylar femur fracture. (Left) Principal Problem:   Closed fracture of left distal femur (HCC) Active Problems:   Type II diabetes mellitus with renal manifestations (HCC)   HTN (hypertension)   CKD (chronic kidney disease), stage IIIa   Atrial fibrillation (HCC)   CAD (coronary artery disease)   Chronic systolic CHF (congestive heart failure) (HCC)   Leukocytosis  Estimated body mass index is 32.07 kg/m as calculated from the following:   Height as of this encounter: 5\' 11"  (1.803 m).   Weight as of this encounter: 104.3 kg. Advance diet Up with therapy D/C IV fluids when tolerating po intake.  Labs reviewed this AM.  WBC 11.1.   Hg 8.1.   Continue with PT, NWB to the left leg. Keep knee in extension until first follow-up appointment in 10-14 days. Begin working on BM.  Patient is passing gas without pain this AM.  If discharged today, plan for follow-up with [facility] in 10-14 days for staple removal.  DVT Prophylaxis - Eliquis Non Weight-Bearing to the left leg  J. Cameron Proud, PA-C [facility] 05/15/2019, 9:54 AM

## 2019-05-15 NOTE — Progress Notes (Signed)
Physical Therapy Treatment Patient Details Name: Edward Strong MRN: [id number] DOB: 10/18/1929 Today's Date: 05/15/2019    History of Present Illness Pt is n 86 year old M admitted s/pORIF of L distal femur following a periprosthetic supracondylar fracture when pt caught his L LE in a plastic chair at home.  PMH includes CVA, CKD III, atrial fibrillation and Hodgkins.    PT Comments    Pt more lethargic this p.m., also requiring frequent encouragement to keep pt focused on treatment as opposed to his disability.  Pt able to perform bed mobility with some improvement, VC's for hand placement and use of R LE.  Pt closed his eyes throughout supine and seated there ex.  PT noted improved sitting balance and pt responded well to postural cues.  He required assistance to elevate LE's over EOB when returning to bed but was able to control trunk and scoot up in bed with bed tilted in trendelenburg.  Pt did demonstrate some improvement in L LE SLR with functional mobility as well.  Pt did not report pain at rest but did report pain increase with there ex and bed mobility.  He will continue to benefit from skilled PT with focus on strength, tolerance to activity, pain management and safe functional mobility.  Follow Up Recommendations  SNF     Equipment Recommendations  None recommended by PT    Recommendations for Other Services       Precautions / Restrictions Precautions Required Braces or Orthoses: Knee Immobilizer - Left Knee Immobilizer - Left: On at all times Restrictions Weight Bearing Restrictions: Yes LLE Weight Bearing: Non weight bearing    Mobility  Bed Mobility Overal bed mobility: Needs Assistance Bed Mobility: Supine to Sit;Sit to Supine     Supine to sit: Max assist Sit to supine: Mod assist   General bed mobility comments: Pt able to get to the bed with somewhat less effort, still tending to scoot anteriorly repetatively with need for cues to avoid this once he was in a  safe position.  Pt able to control trunk to return to bed with PT assisting LE's.  Pt can scoot up in bed with VC's for hand placement and to use R LE to bridge, bed in trendelenburg.  Transfers Overall transfer level: Needs assistance Equipment used: Rolling walker (2 wheeled) Transfers: Sit to/from Stand Sit to Stand: Max assist;+2 physical assistance;From elevated surface;Total assist         General transfer comment: PT directed pt in anterior lean to guide trunk over feet, heavy max/total in standing on both attempts.  Pt remained in a flexed position, reporting pain in L LE and stating that he just felt "weak".  Ambulation/Gait                 Stairs             Wheelchair Mobility    Modified Rankin (Stroke Patients Only)       Balance Overall balance assessment: Needs assistance Sitting-balance support: Feet supported Sitting balance-Leahy Scale: Fair Sitting balance - Comments: One posterior LOB, needs supervision at all times.   Standing balance support: Bilateral upper extremity supported Standing balance-Leahy Scale: Poor Standing balance comment: Pt unable to stand with RW assist today.  Very heavy total A with +2.                            Cognition Arousal/Alertness: Suspect due to medications;Lethargic Behavior  During Therapy: WFL for tasks assessed/performed Overall Cognitive Status: Within Functional Limits for tasks assessed                                 General Comments: Pt closing his eyes throughout session, during ther ex and while sitting at EOB.  Pt keeps repeating "I don't know what is wrong with me.  I feel like I could fall asleep sitting here".  Pt also required frequent encouragement, appearing to have difficulty with adapting to new dependency on assistance with mobility.      Exercises General Exercises - Lower Extremity Ankle Circles/Pumps: Strengthening;Supine;Both;20 reps Quad Sets:  Right;Strengthening;Supine;15 reps Gluteal Sets: Strengthening;Both;Supine;15 reps Heel Slides: Strengthening;Right;10 reps;Supine Hip ABduction/ADduction: Strengthening;Both;Supine;AAROM;15 reps Straight Leg Raises: AAROM;5 reps;Supine;Both Toe Raises: Seated;Both;10 reps;Strengthening Other Exercises Other Exercises: Sitting at EOB/trunk stability: 5 min with VC's for posture and pt performing LAQ's, heel raises, toe raises x20.    General Comments        Pertinent Vitals/Pain Pain Assessment: Faces Faces Pain Scale: Hurts little more Pain Location: L LE at surgical site with bed mobility. Pain Descriptors / Indicators: Grimacing;Guarding Pain Intervention(s): Limited activity within patient's tolerance;Monitored during session    Home Living                      Prior Function            PT Goals (current goals can now be found in the care plan section) Acute Rehab PT Goals Patient Stated Goal: To return home and to PLOF as soon as possible. PT Goal Formulation: With patient Time For Goal Achievement: 05/28/19 Potential to Achieve Goals: Fair Progress towards PT goals: Progressing toward goals    Frequency    BID      PT Plan Current plan remains appropriate    Co-evaluation              AM-PAC PT "6 Clicks" Mobility   Outcome Measure  Help needed turning from your back to your side while in a flat bed without using bedrails?: A Lot Help needed moving from lying on your back to sitting on the side of a flat bed without using bedrails?: A Lot Help needed moving to and from a bed to a chair (including a wheelchair)?: A Lot Help needed standing up from a chair using your arms (e.g., wheelchair or bedside chair)?: A Lot Help needed to walk in hospital room?: Total Help needed climbing 3-5 steps with a railing? : Total 6 Click Score: 10    End of Session Equipment Utilized During Treatment: Gait belt Activity Tolerance: Patient limited by  fatigue;Patient limited by pain Patient left: in bed;with call bell/phone within reach;with bed alarm set Nurse Communication: Mobility status PT Visit Diagnosis: Unsteadiness on feet (R26.81);Muscle weakness (generalized) (M62.81);Pain;Other abnormalities of gait and mobility (R26.89);History of falling (Z91.81) Pain - Right/Left: Left Pain - part of body: Leg     Time: [id number] PT Time Calculation (min) (ACUTE ONLY): 33 min  Charges:  $Therapeutic Exercise: 8-22 mins $Therapeutic Activity: 8-22 mins                     Roxanne Gates, PT, DPT   Roxanne Gates 05/15/2019, 4:04 PM

## 2019-05-15 NOTE — Care Management Important Message (Signed)
Important Message  Patient Details  Name: Edward Strong MRN: [id number] Date of Birth: [birth year]   Medicare Important Message Given:  Yes     Juliann Pulse A Bronte Kropf 05/15/2019, 2:15 PM

## 2019-05-15 NOTE — Progress Notes (Signed)
D: Pt alert and oriented x 4, pt has moment of forgetfulness. Pt is easily redirected. Pt appears very tired today. Pt worked with PT today and dangles at bedside. Pt denies experiencing any pain at this time, pt only reports soreness of surgical leg, however when offered pain management medications pt refuses. Pt only takes scheduled medications. Pt's son is at bedside.  A: Scheduled medications administered to pt, per MD orders. Support and encouragement provided. Frequent verbal contact made.   R: No adverse drug reactions noted. Pt complaint with medications and treatment plan. Pt interacts well with staff on the unit. Pt is stable at this time, will continue to monitor and provide care for as ordered.

## 2019-05-15 NOTE — TOC Progression Note (Signed)
Transition of Care [facility]) - Progression Note    Patient Details  Name: Edward Strong MRN: [id number] Date of Birth: [birth year]  Transition of Care [facility]) CM/SW [place], RN Phone Number: 05/15/2019, 9:32 AM  Clinical Narrative:    Met with the patient to discuss Bed Offers, called his son in the room Edward Strong and included in the discussion, they have chosen to accept the bed offer from [facility], Notified Rick at [employer]   Expected Discharge Plan: [place] Barriers to Discharge: Continued Medical Work up, SNF Pending bed offer  Expected Discharge Plan and Services Expected Discharge Plan: [place]   Discharge Planning Services: CM Consult   Living arrangements for the past 2 months: Single Family Home                 DME Arranged: N/A         HH Arranged: NA           Social Determinants of Health (SDOH) Interventions    Readmission Risk Interventions No flowsheet data found.

## 2019-05-15 NOTE — Progress Notes (Signed)
Physical Therapy Treatment Patient Details Name: Edward Strong MRN: [id number] DOB: 12/18/1929 Today's Date: 05/15/2019    History of Present Illness Pt is n 86 year old M admitted s/pORIF of L distal femur following a periprosthetic supracondylar fracture when pt caught his L LE in a plastic chair at home.  PMH includes CVA, CKD III, atrial fibrillation and Hodgkins.    PT Comments    Pt more alert this morning, requiring frequent encouragement.  He required mod/max A for bed mobility, pt repeatedly moved anteriorly towards EOB with PT reminding him to remain in a safe position.  Pt attempted to stand 2x with Max +2 and was unable to achieve upright posture, appearing very weak and unstable and unable to maintain NWB status.  PT and PT tech assisted pt back to bed, Max A+2.  Pt followed most there ex with VC's and manual assist for setup.  Pt reported 0/10 pain at rest and pain increase with STS mobility.  Pt will benefit from skilled PT with focus on strength, tolerance to activity, pain management and safe functional mobility.  Follow Up Recommendations  SNF     Equipment Recommendations  None recommended by PT    Recommendations for Other Services       Precautions / Restrictions Precautions Required Braces or Orthoses: Knee Immobilizer - Left Knee Immobilizer - Left: On at all times Restrictions Weight Bearing Restrictions: Yes LLE Weight Bearing: Non weight bearing    Mobility  Bed Mobility Overal bed mobility: Needs Assistance Bed Mobility: Supine to Sit;Sit to Supine     Supine to sit: Max assist;+2 for physical assistance Sit to supine: Mod assist;+2 for physical assistance   General bed mobility comments: Assistance with L LE over EOB, hand held assist to bring trunk upright.  Son at bedside to support pt's back if needed.  Experienced one posterior LOB which son helped to correct.  PT and PT tech assisted pt back to bed and in scooting up.  Transfers Overall  transfer level: Needs assistance Equipment used: Rolling walker (2 wheeled) Transfers: Sit to/from Stand Sit to Stand: Max assist;+2 physical assistance;From elevated surface;Total assist         General transfer comment: PT directed pt in anterior lean to guide trunk over feet, heavy max/total in standing on both attempts.  Pt remained in a flexed position, reporting pain in L LE and stating that he just felt "weak".  Ambulation/Gait                 Stairs             Wheelchair Mobility    Modified Rankin (Stroke Patients Only)       Balance Overall balance assessment: Needs assistance Sitting-balance support: Feet supported Sitting balance-Leahy Scale: Fair Sitting balance - Comments: One posterior LOB, needs supervision at all times.   Standing balance support: Bilateral upper extremity supported Standing balance-Leahy Scale: Poor Standing balance comment: Pt unable to stand with RW assist today.  Very heavy total A with +2.                            Cognition Arousal/Alertness: Suspect due to medications Behavior During Therapy: WFL for tasks assessed/performed Overall Cognitive Status: Within Functional Limits for tasks assessed  General Comments: Pt more alert this morning than yesterday, still with some inconsistencies in conversation.  Follows directions consistently.  Appears to be discouraged with condition, repeating "I didn 't know I was in this bad of shape".      Exercises General Exercises - Lower Extremity Ankle Circles/Pumps: Strengthening;Supine;Both;20 reps Quad Sets: Right;Strengthening;Supine;15 reps Gluteal Sets: Strengthening;Both;Supine;15 reps Heel Slides: Strengthening;Right;10 reps;Supine Hip ABduction/ADduction: Strengthening;Both;Supine;AAROM;15 reps Straight Leg Raises: AAROM;5 reps;Supine;Both Toe Raises: Seated;Both;10 reps;Strengthening Other Exercises Other  Exercises: Education: benefit of frequent ther ex and mobility/upright posture.  x3 min    General Comments        Pertinent Vitals/Pain Pain Assessment: Faces Faces Pain Scale: Hurts little more Pain Location: L LE at surgical site.  0/10 pain at rest. Pain Descriptors / Indicators: Guarding;Grimacing Pain Intervention(s): Limited activity within patient's tolerance;Monitored during session    Home Living                      Prior Function            PT Goals (current goals can now be found in the care plan section) Acute Rehab PT Goals Patient Stated Goal: To return home and to PLOF as soon as possible. PT Goal Formulation: With patient Time For Goal Achievement: 05/28/19 Potential to Achieve Goals: Good Progress towards PT goals: Progressing toward goals    Frequency    BID      PT Plan Current plan remains appropriate    Co-evaluation              AM-PAC PT "6 Clicks" Mobility   Outcome Measure  Help needed turning from your back to your side while in a flat bed without using bedrails?: A Lot Help needed moving from lying on your back to sitting on the side of a flat bed without using bedrails?: A Lot Help needed moving to and from a bed to a chair (including a wheelchair)?: A Lot Help needed standing up from a chair using your arms (e.g., wheelchair or bedside chair)?: A Lot Help needed to walk in hospital room?: Total Help needed climbing 3-5 steps with a railing? : Total 6 Click Score: 10    End of Session Equipment Utilized During Treatment: Gait belt Activity Tolerance: Patient limited by fatigue;Patient limited by pain Patient left: in bed;with call bell/phone within reach;with bed alarm set Nurse Communication: Mobility status PT Visit Diagnosis: Unsteadiness on feet (R26.81);Muscle weakness (generalized) (M62.81);Pain;Other abnormalities of gait and mobility (R26.89);History of falling (Z91.81) Pain - Right/Left: Left Pain - part of  body: Leg     Time: [id number] PT Time Calculation (min) (ACUTE ONLY): 33 min  Charges:  $Therapeutic Exercise: 23-37 mins                     Roxanne Gates, PT, DPT   Roxanne Gates 05/15/2019, 2:20 PM

## 2019-05-15 NOTE — Progress Notes (Signed)
PT Cancellation Note  Patient Details Name: Edward Strong MRN: [id number] DOB: 1930-02-07   Cancelled Treatment:    Reason Eval/Treat Not Completed: Patient at procedure or test/unavailable.  Nursing and physician currently in room with patient.  Will return shortly.  Roxanne Gates, PT, DPT  Roxanne Gates 05/15/2019, 8:50 AM

## 2019-05-16 DIAGNOSIS — N179 Acute kidney failure, unspecified: Secondary | ICD-10-CM

## 2019-05-16 DIAGNOSIS — Z794 Long term (current) use of insulin: Secondary | ICD-10-CM

## 2019-05-16 DIAGNOSIS — E1121 Type 2 diabetes mellitus with diabetic nephropathy: Secondary | ICD-10-CM

## 2019-05-16 DIAGNOSIS — D62 Acute posthemorrhagic anemia: Secondary | ICD-10-CM | POA: Diagnosis present

## 2019-05-16 DIAGNOSIS — I4821 Permanent atrial fibrillation: Secondary | ICD-10-CM

## 2019-05-16 LAB — BASIC METABOLIC PANEL
Anion gap: 8 (ref 5–15)
BUN: 56 mg/dL — ABNORMAL HIGH (ref 8–23)
CO2: 21 mmol/L — ABNORMAL LOW (ref 22–32)
Calcium: 7.8 mg/dL — ABNORMAL LOW (ref 8.9–10.3)
Chloride: 107 mmol/L (ref 98–111)
Creatinine, Ser: 1.92 mg/dL — ABNORMAL HIGH (ref 0.61–1.24)
GFR calc Af Amer: 35 mL/min — ABNORMAL LOW (ref >60)
GFR calc non Af Amer: 31 mL/min — ABNORMAL LOW (ref >60)
Glucose, Bld: 165 mg/dL — ABNORMAL HIGH (ref 70–99)
Potassium: 4.7 mmol/L (ref 3.5–5.1)
Sodium: 136 mmol/L (ref 135–145)

## 2019-05-16 LAB — SARS CORONAVIRUS 2 (TAT 6-24 HRS): SARS Coronavirus 2: NEGATIVE

## 2019-05-16 LAB — GLUCOSE, CAPILLARY
Glucose-Capillary: 146 mg/dL — ABNORMAL HIGH (ref 70–99)
Glucose-Capillary: 148 mg/dL — ABNORMAL HIGH (ref 70–99)
Glucose-Capillary: 150 mg/dL — ABNORMAL HIGH (ref 70–99)
Glucose-Capillary: 161 mg/dL — ABNORMAL HIGH (ref 70–99)
Glucose-Capillary: 162 mg/dL — ABNORMAL HIGH (ref 70–99)

## 2019-05-16 LAB — CBC
HCT: 23.5 % — ABNORMAL LOW (ref 39.0–52.0)
Hemoglobin: 7.8 g/dL — ABNORMAL LOW (ref 13.0–17.0)
MCH: 33.8 pg (ref 26.0–34.0)
MCHC: 33.2 g/dL (ref 30.0–36.0)
MCV: 101.7 fL — ABNORMAL HIGH (ref 80.0–100.0)
Platelets: 147 10*3/uL — ABNORMAL LOW (ref 150–400)
RBC: 2.31 MIL/uL — ABNORMAL LOW (ref 4.22–5.81)
RDW: 13.1 % (ref 11.5–15.5)
WBC: 9.6 10*3/uL (ref 4.0–10.5)
nRBC: 0 % (ref 0.0–0.2)

## 2019-05-16 LAB — FERRITIN: Ferritin: 45 ng/mL (ref 24–336)

## 2019-05-16 LAB — IRON AND TIBC
Iron: 18 ug/dL — ABNORMAL LOW (ref 45–182)
Saturation Ratios: 8 % — ABNORMAL LOW (ref 17.9–39.5)
TIBC: 235 ug/dL — ABNORMAL LOW (ref 250–450)
UIBC: 217 ug/dL

## 2019-05-16 LAB — PREPARE RBC (CROSSMATCH)

## 2019-05-16 MED ORDER — POLYETHYLENE GLYCOL 3350 17 G PO PACK: 17.0000 g | PACK | Freq: Every day | ORAL | 0 refills | Status: AC

## 2019-05-16 MED ORDER — INFLUENZA VAC A&B SA ADJ QUAD 0.5 ML IM PRSY: 0.5000 mL | PREFILLED_SYRINGE | INTRAMUSCULAR | Status: DC

## 2019-05-16 MED ORDER — ENSURE MAX PROTEIN PO LIQD: 11.0000 [oz_av] | Freq: Two times a day (BID) | ORAL | 0 refills | Status: AC

## 2019-05-16 MED ORDER — OXYCODONE HCL 5 MG PO TABS: 5.0000 mg | ORAL_TABLET | ORAL | 0 refills | Status: DC | PRN

## 2019-05-16 MED ORDER — FERROUS SULFATE 325 (65 FE) MG PO TABS: 325.0000 mg | ORAL_TABLET | Freq: Two times a day (BID) | ORAL | 3 refills | Status: DC

## 2019-05-16 MED ORDER — INSULIN REGULAR HUMAN 100 UNIT/ML IJ SOLN: 5.0000 [IU] | Freq: Three times a day (TID) | INTRAMUSCULAR | 11 refills | Status: DC

## 2019-05-16 MED ORDER — BISACODYL 10 MG RE SUPP: 10.0000 mg | Freq: Every day | RECTAL | 0 refills | Status: AC | PRN

## 2019-05-16 MED ORDER — INSULIN NPH (HUMAN) (ISOPHANE) 100 UNIT/ML ~~LOC~~ SUSP: 20.0000 [IU] | Freq: Every evening | SUBCUTANEOUS | 11 refills | Status: DC

## 2019-05-16 MED ORDER — SODIUM CHLORIDE 0.9% IV SOLUTION: Freq: Once | INTRAVENOUS | Status: AC

## 2019-05-16 NOTE — Progress Notes (Signed)
Physical Therapy Treatment Patient Details Name: Edward Strong MRN: [id number] DOB: 21-Nov-1929 Today's Date: 05/16/2019    History of Present Illness Pt is n 86 year old M admitted s/pORIF of Lt distal femur following a periprosthetic supracondylar fracture when pt caught his L LE in a plastic chair at home.  PMH includes CVA, CKD III, atrial fibrillation and Hodgkins.    PT Comments    Pt in bed upon entry, breakfast tray finished, pt somnolent but easily rousable throughout session, although he falls asleep with nearly every exercise. Author moved to more functional mobility in hopes to improve alertness, but avail is only minimal and brief. Pt moved to recliner with totalA+2, but remains lethargic, still able to complete some exercises in chair with guidance, but too sleepy to do much. Pt left up in chair, all needs met; chair alarm, SCDx1, and polar care all reconnected prior to exit. Callbellwithinreach.   Follow Up Recommendations  SNF     Equipment Recommendations  None recommended by PT    Recommendations for Other Services       Precautions / Restrictions Precautions Precautions: None Required Braces or Orthoses: Knee Immobilizer - Left Knee Immobilizer - Left: On at all times Restrictions LLE Weight Bearing: Non weight bearing    Mobility  Bed Mobility Overal bed mobility: Needs Assistance Bed Mobility: Supine to Sit     Supine to sit: Max assist     General bed mobility comments: Author assists with BLE OOB; Pt pulls self into trunk flexion with RUE grasping author's hand. Pt intermittently successful with independent sitting, but largely unsteady.  Transfers Overall transfer level: Needs assistance Equipment used: 1 person hand held assist Transfers: Squat Pivot Transfers     Squat pivot transfers: Total assist;+2 physical assistance     General transfer comment: difficulty transitioning to chair as chair moves slightly and linen creates a barrier. Pt  readjusted seevral times to get fully into chair. Pt somnolent throughout, fallign asleep a couple times.  Ambulation/Gait                 Stairs             Wheelchair Mobility    Modified Rankin (Stroke Patients Only)       Balance Overall balance assessment: Needs assistance Sitting-balance support: Feet supported;Single extremity supported Sitting balance-Leahy Scale: Poor Sitting balance - Comments: poor trunk awareness, frequent loss of balance   Standing balance support: Bilateral upper extremity supported;During functional activity Standing balance-Leahy Scale: Zero                              Cognition Arousal/Alertness: Suspect due to medications;Lethargic Behavior During Therapy: (confused)                                   General Comments: somnolent, mildly confused at time, difficulty with following commands.      Exercises General Exercises - Lower Extremity Ankle Circles/Pumps: Strengthening;Supine;Left;15 reps;Limitations Ankle Circles/Pumps Limitations: pt somnolent, requires rousing q 5 reps Long Arc Quad: Right;15 reps;Supine;Limitations Long [employer] Limitations: from chair; pt somnolent, requires rousing q 5 reps Heel Slides: Strengthening;Right;Supine;15 reps;Limitations Heel Slides Limitations: pt somnolent, requires rousing q 5 reps Hip ABduction/ADduction: Strengthening;Supine;AAROM;15 reps;Left;Limitations Hip Abduction/Adduction Limitations: pt somnolent, requires rousing q 5 reps Straight Leg Raises: AAROM;Supine;Left;15 reps;Limitations Straight Leg Raises Limitations: requires mod-maxA of limb  General Comments        Pertinent Vitals/Pain Pain Assessment: No/denies pain(some Left thigh pain with bed mobility) Pain Descriptors / Indicators: Grimacing;Guarding Pain Intervention(s): Limited activity within patient's tolerance;Monitored during session;Repositioned    Home Living                       Prior Function            PT Goals (current goals can now be found in the care plan section) Acute Rehab PT Goals Patient Stated Goal: To return home and to PLOF as soon as possible. PT Goal Formulation: With patient Time For Goal Achievement: 05/28/19 Potential to Achieve Goals: Fair Progress towards PT goals: Not progressing toward goals - comment    Frequency    BID      PT Plan Current plan remains appropriate    Co-evaluation              AM-PAC PT "6 Clicks" Mobility   Outcome Measure  Help needed turning from your back to your side while in a flat bed without using bedrails?: A Lot Help needed moving from lying on your back to sitting on the side of a flat bed without using bedrails?: A Lot Help needed moving to and from a bed to a chair (including a wheelchair)?: Total Help needed standing up from a chair using your arms (e.g., wheelchair or bedside chair)?: Total Help needed to walk in hospital room?: Total Help needed climbing 3-5 steps with a railing? : Total 6 Click Score: 8    End of Session Equipment Utilized During Treatment: Gait belt Activity Tolerance: Patient limited by fatigue;Patient limited by lethargy Patient left: with call bell/phone within reach;in chair;with chair alarm set;with SCD's reapplied   PT Visit Diagnosis: Unsteadiness on feet (R26.81);Muscle weakness (generalized) (M62.81);Pain;Other abnormalities of gait and mobility (R26.89);History of falling (Z91.81) Pain - Right/Left: Left Pain - part of body: Leg     Time: 8685-4883 PT Time Calculation (min) (ACUTE ONLY): 40 min  Charges:  $Therapeutic Exercise: 38-52 mins                     10:27 AM, 05/16/19 Etta Grandchild, PT, DPT Physical Therapist - [facility]  [phone] ([name])    Julliana Whitmyer C 05/16/2019, 10:21 AM

## 2019-05-16 NOTE — Progress Notes (Signed)
Report called to [facility] , EMS called patient in recliner resting, ordered dinner since patient is 4th on EMS list VSS>

## 2019-05-16 NOTE — TOC Progression Note (Signed)
Transition of Care [facility]) - Progression Note    Patient Details  Name: Edward Strong MRN: [id number] Date of Birth: [birth year]  Transition [facility] Care [facility]) CM/SW [place], RN Phone Number: 05/16/2019, 12:14 PM  Clinical Narrative:    [name] from [facility] called and said they needed the patient by 3 PM due to iexpected bad weather, I notified the physician   Expected Discharge Plan: [place] Barriers to Discharge: Continued Medical Work up, SNF Pending bed offer  Expected Discharge Plan and Services Expected Discharge Plan: [place]   Discharge Planning Services: CM Consult   Living arrangements for the past 2 months: Single Family Home                 DME Arranged: N/A         HH Arranged: NA           Social Determinants of Health (SDOH) Interventions    Readmission Risk Interventions No flowsheet data found.

## 2019-05-16 NOTE — Progress Notes (Signed)
Patient discharged via EMS to [facility]

## 2019-05-16 NOTE — Progress Notes (Addendum)
PT Cancellation Note  Patient Details Name: Edward Strong MRN: [id number] DOB: Dec 23, 1929   Cancelled Treatment:    Reason Eval/Treat Not Completed: Medical issues which prohibited therapy;Patient at procedure or test/unavailable(Per OT, pt preparing for imminent blood transfusion followed by DC to facility. Will hold PT treatement at this time.)  3:35 PM, 05/16/19 Etta Grandchild, PT, DPT Physical Therapist - [facility]  [phone] ([place])    Theordore Cisnero C 05/16/2019, 3:35 PM

## 2019-05-16 NOTE — TOC Transition Note (Signed)
Transition of Care [facility]) - CM/SW Discharge Note   Patient Details  Name: Edward Strong MRN: [id number] Date of Birth: 1929-06-15  Transition of Care [facility]) CM/SW Contact:  Su Hilt, RN Phone Number: 05/16/2019, 2:20 PM   Clinical Narrative:    Patient to DC to [facility] today, Bedside nurse to call report to [facility], Son [name] and [place] both aware of the DC, Bedside nurse to call EMS for transport   Final next level of care: Skilled Nursing Facility Barriers to Discharge: Barriers Resolved   Patient Goals and CMS Choice Patient states their goals for this hospitalization and ongoing recovery are:: go home      Discharge Placement              [facility] room B10) Patient to be transferred to facility by: EMS Name of family member notified: [name] Patient and family notified of of transfer: 05/16/19  Discharge Plan and Services   Discharge Planning Services: CM Consult            DME Arranged: N/A         HH Arranged: NA          Social Determinants of Health (SDOH) Interventions     Readmission Risk Interventions No flowsheet data found.

## 2019-05-16 NOTE — Progress Notes (Signed)
OT Cancellation Note  Patient Details Name: Edward Strong MRN: [id number] DOB: 1929/07/29   Cancelled Treatment:    Reason Eval/Treat Not Completed: Medical issues which prohibited therapy. Consult received, chart reviewed. Pt noted with Hgb 7.8 and pending blood transfusion. Spoke with RN, waiting on IV team to replace IV for blood transfusion. Per chart, pt pending discharge to SNF today. Will hold OT evaluation and re-attempt at later date/time if pt does not discharge this date and medically appropriate.   Jeni Salles, MPH, MS, OTR/L ascom [phone] 05/16/19, 1:16 PM

## 2019-05-16 NOTE — Discharge Summary (Addendum)
Physician Discharge Summary  Edward Strong [id number] DOB: May 16, 1929 DOA: 05/12/2019  PCP: Edward Strong, No Pcp Per  Admit date: 05/12/2019 Discharge date: 05/16/2019  Admitted From: Home Disposition: Skilled nursing facility  Recommendations for Outpatient Follow-up:  Follow-up with PCP at SNF within 1 week.  Please check H&H in the next 48-72 hours. Follow-up with orthopedic surgery in 2 weeks  Equipment/Devices: As per therapy at the facility  Discharge Condition: Fair CODE STATUS: Full code Diet recommendation: Heart Healthy / Carb Modified     Discharge Diagnoses:  Principal Problem:   Closed fracture of left distal femur ([place])  Active Problems:   Type II diabetes mellitus with renal manifestations (HCC)   HTN (hypertension)   CKD (chronic kidney disease), stage IIIa   Atrial fibrillation, chronic (HCC)   CAD (coronary artery disease)   Chronic systolic CHF (congestive heart failure) (HCC)   Leukocytosis   Postoperative anemia due to acute blood loss  Brief narrative/HPI 86 year old male with history of CAD, stroke, diastolic CHF, Hodgkin's lymphoma, A. fib on Eliquis, CKD stage III, hypertension, diabetes mellitus who presented with closed left distal (supracondylar) femur fracture and underwent ORIF.  Hospital course  Principal Problem:   Closed fracture of left distal femur ([place]) S/p ORIF of closed left periprosthetic supracondylar femur fracture. Pain control with as needed Percocet.  Added bowel regimen.  PT recommends SNF. Eliquis resumed.  Noted for drop in hemoglobin which I suspect is postoperative.  Monitor H&H as outpatient  Orthopedics recommends nonweightbearing on left leg.  Outpatient follow-up in 2 weeks.  Active Problems:   Type II diabetes mellitus with renal manifestations (HCC) CBG remained stable.  Edward Strong was on high-dose of insulin at home (50 units of Lantus with 20 units aspart premeal 3 times a day).  He is only on 30 units Lantus here with  well-controlled CBG in the 100s.  I will reduce his insulin dose upon discharge (20 units NPH  daily at bedtime with 5 units premeal 3 times a day).    Essential HTN (hypertension) Lasix and Cozaar were held for AKI and low normal blood pressure.  Now stable.   Acute kidney injury on CKD (chronic kidney disease), stage IIIa Suspect prerenal with dehydration and blood loss anemia.    Lasix and ARB held.  Improved with gentle hydration.  Meds resumed upon discharge.    Atrial fibrillation, paroxysmal (HCC) Rate controlled.  Continue Eliquis.     Chronic systolic CHF (congestive heart failure) (HCC) Euvolemic.  Continue Lasix  Acute postoperative blood loss anemia/iron deficiency Iron panel suggest iron deficiency.  Hemoglobin dropped to 7.8 from baseline around 12-13, suspect postoperative.  No acute GI bleed.  Hemoccult pending.  Added iron supplement.  Edward Strong given 1 unit PRBC on 1/7. Please check hemoglobin in the next 25 and 48 hours.      Family Communication  : Son at bedside  Disposition Plan  : SNF  Consults  : Orthopedic  Procedures  : ORIF left femur  Discharge Instructions   Allergies as of 05/16/2019      Reactions   Bee Venom Other (See Comments)   Ezetimibe Other (See Comments)   Fenofibrate Micronized Other (See Comments)   Pravastatin Other (See Comments)   Sulfa Antibiotics Other (See Comments)      Medication List    TAKE these medications   aspirin 81 MG chewable tablet Chew 81 mg by mouth daily.   bisacodyl 10 MG suppository Commonly known as: DULCOLAX Place 1 suppository (  10 mg total) rectally daily as needed for moderate constipation.   Eliquis 2.5 MG Tabs tablet Generic drug: apixaban Take 2.5 mg by mouth 2 (two) times daily.   Ensure Max Protein Liqd Take 330 mLs (11 oz total) by mouth 2 (two) times daily between meals.   ferrous sulfate 325 (65 FE) MG tablet Take 1 tablet (325 mg total) by mouth 2 (two) times daily  with a meal.   furosemide 40 MG tablet Commonly known as: LASIX Take 40 mg by mouth daily   insulin NPH Human 100 UNIT/ML injection Commonly known as: NOVOLIN N Inject 20 Units into the skin every evening.   insulin regular 100 units/mL injection Commonly known as: NOVOLIN R Inject 5 Units into the skin 3 (three) times daily before meals.   losartan 100 MG tablet Commonly known as: COZAAR Take 100 mg by mouth daily.   oxyCODONE 5 MG immediate release tablet Commonly known as: Oxy IR/ROXICODONE Take 1 tablet by mouth every 4 (four) hours as needed for moderate pain (pain score 4-6). Total 20 tablets, no refills   polyethylene glycol 17 g packet Commonly known as: MIRALAX / GLYCOLAX Take 17 g by mouth daily. Start taking on: May 17, 2019       Contact information for follow-up providers    Reche Dixon, PA-C Follow up in 10 day(s).   Specialty: Orthopedic Surgery Why: 10-14 days for staple removal. Contact information: [address] [place] [address] [phone]            Contact information for after-discharge care    Destination    [facility] .   Service: Skilled Nursing Contact information: [address] [place] [phone]                 Allergies  Allergen Reactions  . Bee Venom Other (See Comments)  . Ezetimibe Other (See Comments)  . Fenofibrate Micronized Other (See Comments)  . Pravastatin Other (See Comments)  . Sulfa Antibiotics Other (See Comments)      Procedures/Studies: DG Chest Portable 1 View  Result Date: 05/12/2019 CLINICAL DATA:  Preoperative examination for Edward Strong with a distal left femur fracture suffered in a fall today. EXAM: PORTABLE CHEST 1 VIEW COMPARISON:  None. FINDINGS: There is volume loss in the right chest with suture material present. Calcified granulomata are seen in the right lung and a calcified right hilar lymph node is  also noted. Lungs are otherwise clear. No pneumothorax or pleural effusion. Heart size is normal. Atherosclerosis is seen. IMPRESSION: No acute disease. Atherosclerosis. Electronically Signed   By: Inge Rise M.D.   On: 05/12/2019 12:03   DG Knee Complete 4 Views Left  Result Date: 05/12/2019 CLINICAL DATA:  Left knee pain after falling at home today. EXAM: LEFT KNEE - COMPLETE 4+ VIEW COMPARISON:  None. FINDINGS: Status post total knee arthroplasty. There is a comminuted and moderately displaced fracture of the distal femur just proximal to the femoral component of the arthroplasty. This fracture demonstrates up to 2.1 cm of posterior and 4.3 cm of proximal displacement. No dislocation. The proximal tibia and fibula are intact. Diffuse vascular calcifications are noted. IMPRESSION: Comminuted and moderately displaced fracture of the distal femur just proximal to the femoral component of the total knee arthroplasty. Electronically Signed   By: Richardean Sale M.D.   On: 05/12/2019 10:45   DG C-Arm 1-60 Min  Result Date: 05/13/2019 CLINICAL DATA:  ORIF of distal LEFT femoral fracture EXAM: LEFT FEMUR 2 VIEWS; DG C-ARM 1-60 MIN COMPARISON:  05/12/2019 FLUOROSCOPY TIME:  1 minutes 1 second Images obtained: 4 FINDINGS: Images demonstrate placement of a lateral plate and multiple screws across a reduced fracture of the distal LEFT femoral metadiaphysis. Bones appear demineralized. Cerclage wire present. LEFT knee prosthesis seen. No additional osseous abnormalities identified. IMPRESSION: Post ORIF of previously identified distal LEFT femoral metadiaphyseal fracture. Electronically Signed   By: Lavonia Dana M.D.   On: 05/13/2019 15:03   DG FEMUR MIN 2 VIEWS LEFT  Result Date: 05/13/2019 CLINICAL DATA:  ORIF of distal LEFT femoral fracture EXAM: LEFT FEMUR 2 VIEWS; DG C-ARM 1-60 MIN COMPARISON:  05/12/2019 FLUOROSCOPY TIME:  1 minutes 1 second Images obtained: 4 FINDINGS: Images demonstrate placement of a  lateral plate and multiple screws across a reduced fracture of the distal LEFT femoral metadiaphysis. Bones appear demineralized. Cerclage wire present. LEFT knee prosthesis seen. No additional osseous abnormalities identified. IMPRESSION: Post ORIF of previously identified distal LEFT femoral metadiaphyseal fracture. Electronically Signed   By: Lavonia Dana M.D.   On: 05/13/2019 15:03       Subjective: Reports feeling tired.  No overnight events.  Discharge Exam: Vitals:   05/16/19 1204 05/16/19 1230  BP: (!) 144/60 (!) 141/66  Pulse: 78 69  Resp:    Temp: 98.8 F (37.1 C)   SpO2: 97% 95%   Vitals:   05/15/19 2321 05/16/19 0817 05/16/19 1204 05/16/19 1230  BP: 134/60 (!) 154/91 (!) 144/60 (!) 141/66  Pulse: 62 79 78 69  Resp: 17 18    Temp: 98.6 F (37 C) 98.6 F (37 C) 98.8 F (37.1 C)   TempSrc: Oral  Oral   SpO2: 95% 94% 97% 95%  Weight:      Height:        General: Elderly male not in distress HEENT: Moist mucosa, supple neck Chest: Clear bilaterally CVs: Normal S1-S2, no murmurs GI: Soft, nondistended, nontender Musculoskeletal: Warm, Ace wrap with brace over left leg, no edema   The results of significant diagnostics from this hospitalization (including imaging, microbiology, ancillary and laboratory) are listed below for reference.     Microbiology: Recent Results (from the past 240 hour(s))  Respiratory Panel by RT PCR (Flu A&B, Covid) - Nasopharyngeal Swab     Status: None   Collection Time: 05/12/19 12:23 PM   Specimen: Nasopharyngeal Swab  Result Value Ref Range Status   SARS Coronavirus 2 by RT PCR NEGATIVE NEGATIVE Final    Comment: (NOTE) SARS-CoV-2 target nucleic acids are NOT DETECTED. The SARS-CoV-2 RNA is generally detectable in upper respiratoy specimens during the acute phase of infection. The lowest concentration of SARS-CoV-2 viral copies this assay can detect is 131 copies/mL. A negative result does not preclude SARS-Cov-2 infection and  should not be used as the sole basis for treatment or other Edward Strong management decisions. A negative result may occur with  improper specimen collection/handling, submission of specimen other than nasopharyngeal swab, presence of viral mutation(s) within the areas targeted by this assay, and inadequate number of viral copies (<131 copies/mL). A negative result must be combined with clinical observations, Edward Strong history, and epidemiological information. The expected result is Negative. Fact Sheet for Patients:  PinkCheek.be Fact Sheet for Healthcare Providers:  GravelBags.it This test is not yet ap proved or cleared by the [place] FDA and  has been authorized for detection and/or diagnosis of SARS-CoV-2 by FDA under an Emergency Use Authorization (  EUA). This EUA will remain  in effect (meaning this test can be used) for the duration of the COVID-19 declaration under Section 564(b)(1) of the Act, 21 U.S.C. section 360bbb-3(b)(1), unless the authorization is terminated or revoked sooner.    Influenza A by PCR NEGATIVE NEGATIVE Final   Influenza B by PCR NEGATIVE NEGATIVE Final    Comment: (NOTE) The Xpert Xpress SARS-CoV-2/FLU/RSV assay is intended as an aid in  the diagnosis of influenza from Nasopharyngeal swab specimens and  should not be used as a sole basis for treatment. Nasal washings and  aspirates are unacceptable for Xpert Xpress SARS-CoV-2/FLU/RSV  testing. Fact Sheet for Patients: PinkCheek.be Fact Sheet for Healthcare Providers: GravelBags.it This test is not yet approved or cleared by the [place] FDA and  has been authorized for detection and/or diagnosis of SARS-CoV-2 by  FDA under an Emergency Use Authorization (EUA). This EUA will remain  in effect (meaning this test can be used) for the duration of the  Covid-19 declaration under Section  564(b)(1) of the Act, 21  U.S.C. section 360bbb-3(b)(1), unless the authorization is  terminated or revoked. Performed at [facility], [place], [place] [address]   SARS CORONAVIRUS 2 (TAT 6-24 HRS) Nasopharyngeal Nasopharyngeal Swab     Status: None   Collection Time: 05/15/19  2:08 PM   Specimen: Nasopharyngeal Swab  Result Value Ref Range Status   SARS Coronavirus 2 NEGATIVE NEGATIVE Final    Comment: (NOTE) SARS-CoV-2 target nucleic acids are NOT DETECTED. The SARS-CoV-2 RNA is generally detectable in upper and lower respiratory specimens during the acute phase of infection. Negative results do not preclude SARS-CoV-2 infection, do not rule out co-infections with other pathogens, and should not be used as the sole basis for treatment or other Edward Strong management decisions. Negative results must be combined with clinical observations, Edward Strong history, and epidemiological information. The expected result is Negative. Fact Sheet for Patients: SugarRoll.be Fact Sheet for Healthcare Providers: https://www.woods-mathews.com/ This test is not yet approved or cleared by the [place] FDA and  has been authorized for detection and/or diagnosis of SARS-CoV-2 by FDA under an Emergency Use Authorization (EUA). This EUA will remain  in effect (meaning this test can be used) for the duration of the COVID-19 declaration under Section 56 4(b)(1) of the Act, 21 U.S.C. section 360bbb-3(b)(1), unless the authorization is terminated or revoked sooner. Performed at [facility], [place] [address], [place], [place] [address]      Labs: BNP (last 3 results) Recent Labs    05/12/19 1230  BNP [id number]*   Basic Metabolic Panel: Recent Labs  Lab 05/12/19 1222 05/14/19 0446 05/15/19 0341 05/16/19 0234  NA 138 135 137 136  K 4.4 4.4 4.5 4.7  CL 105 106 107 107  CO2 20* 22 22 21*  GLUCOSE 206* 178* 145* 165*  BUN 41*  45* 52* 56*  CREATININE 1.53* 1.76* 2.21* 1.92*  CALCIUM 9.4 8.1* 7.9* 7.8*   Liver Function Tests: Recent Labs  Lab 05/12/19 1222  AST 28  ALT 19  ALKPHOS 63  BILITOT 1.3*  PROT 7.2  ALBUMIN 3.5   No results for input(s): LIPASE, AMYLASE in the last 168 hours. No results for input(s): AMMONIA in the last 168 hours. CBC: Recent Labs  Lab 05/12/19 1222 05/14/19 0446 05/15/19 0341 05/16/19 0234  WBC 17.4* 13.5* 11.1* 9.6  NEUTROABS 15.5*  --   --   --   HGB 13.5 9.4* 8.1* 7.8*  HCT  38.7* 28.3* 24.9* 23.5*  MCV 93.9 100.7* 103.3* 101.7*  PLT 178 144* 134* 147*   Cardiac Enzymes: No results for input(s): CKTOTAL, CKMB, CKMBINDEX, TROPONINI in the last 168 hours. BNP: Invalid input(s): POCBNP CBG: Recent Labs  Lab 05/15/19 2007 05/16/19 0043 05/16/19 0436 05/16/19 0814 05/16/19 1150  GLUCAP 191* 161* 146* 150* 162*   D-Dimer No results for input(s): DDIMER in the last 72 hours. Hgb A1c No results for input(s): HGBA1C in the last 72 hours. Lipid Profile No results for input(s): CHOL, HDL, LDLCALC, TRIG, CHOLHDL, LDLDIRECT in the last 72 hours. Thyroid function studies No results for input(s): TSH, T4TOTAL, T3FREE, THYROIDAB in the last 72 hours.  Invalid input(s): FREET3 Anemia work up Recent Labs    05/16/19 0234  FERRITIN 45  TIBC 235*  IRON 18*   Urinalysis    Component Value Date/Time   COLORURINE AMBER (A) 05/13/2019 0250   APPEARANCEUR CLOUDY (A) 05/13/2019 0250   LABSPEC 1.018 05/13/2019 0250   PHURINE 7.0 05/13/2019 0250   GLUCOSEU NEGATIVE 05/13/2019 0250   HGBUR NEGATIVE 05/13/2019 0250   BILIRUBINUR NEGATIVE 05/13/2019 0250   KETONESUR 5 (A) 05/13/2019 0250   PROTEINUR 100 (A) 05/13/2019 0250   NITRITE NEGATIVE 05/13/2019 0250   LEUKOCYTESUR TRACE (A) 05/13/2019 0250   Sepsis Labs Invalid input(s): PROCALCITONIN,  WBC,  LACTICIDVEN Microbiology Recent Results (from the past 240 hour(s))  Respiratory Panel by RT PCR (Flu A&B,  Covid) - Nasopharyngeal Swab     Status: None   Collection Time: 05/12/19 12:23 PM   Specimen: Nasopharyngeal Swab  Result Value Ref Range Status   SARS Coronavirus 2 by RT PCR NEGATIVE NEGATIVE Final    Comment: (NOTE) SARS-CoV-2 target nucleic acids are NOT DETECTED. The SARS-CoV-2 RNA is generally detectable in upper respiratoy specimens during the acute phase of infection. The lowest concentration of SARS-CoV-2 viral copies this assay can detect is 131 copies/mL. A negative result does not preclude SARS-Cov-2 infection and should not be used as the sole basis for treatment or other Edward Strong management decisions. A negative result may occur with  improper specimen collection/handling, submission of specimen other than nasopharyngeal swab, presence of viral mutation(s) within the areas targeted by this assay, and inadequate number of viral copies (<131 copies/mL). A negative result must be combined with clinical observations, Edward Strong history, and epidemiological information. The expected result is Negative. Fact Sheet for Patients:  PinkCheek.be Fact Sheet for Healthcare Providers:  GravelBags.it This test is not yet ap proved or cleared by the [place] FDA and  has been authorized for detection and/or diagnosis of SARS-CoV-2 by FDA under an Emergency Use Authorization (EUA). This EUA will remain  in effect (meaning this test can be used) for the duration of the COVID-19 declaration under Section 564(b)(1) of the Act, 21 U.S.C. section 360bbb-3(b)(1), unless the authorization is terminated or revoked sooner.    Influenza A by PCR NEGATIVE NEGATIVE Final   Influenza B by PCR NEGATIVE NEGATIVE Final    Comment: (NOTE) The Xpert Xpress SARS-CoV-2/FLU/RSV assay is intended as an aid in  the diagnosis of influenza from Nasopharyngeal swab specimens and  should not be used as a sole basis for treatment. Nasal washings and   aspirates are unacceptable for Xpert Xpress SARS-CoV-2/FLU/RSV  testing. Fact Sheet for Patients: PinkCheek.be Fact Sheet for Healthcare Providers: GravelBags.it This test is not yet approved or cleared by the [place] FDA and  has been authorized for detection and/or diagnosis of SARS-CoV-2 by  FDA  under an Emergency Use Authorization (EUA). This EUA will remain  in effect (meaning this test can be used) for the duration of the  Covid-19 declaration under Section 564(b)(1) of the Act, 21  U.S.C. section 360bbb-3(b)(1), unless the authorization is  terminated or revoked. Performed at [facility], [place], [place] [address]   SARS CORONAVIRUS 2 (TAT 6-24 HRS) Nasopharyngeal Nasopharyngeal Swab     Status: None   Collection Time: 05/15/19  2:08 PM   Specimen: Nasopharyngeal Swab  Result Value Ref Range Status   SARS Coronavirus 2 NEGATIVE NEGATIVE Final    Comment: (NOTE) SARS-CoV-2 target nucleic acids are NOT DETECTED. The SARS-CoV-2 RNA is generally detectable in upper and lower respiratory specimens during the acute phase of infection. Negative results do not preclude SARS-CoV-2 infection, do not rule out co-infections with other pathogens, and should not be used as the sole basis for treatment or other Edward Strong management decisions. Negative results must be combined with clinical observations, Edward Strong history, and epidemiological information. The expected result is Negative. Fact Sheet for Patients: SugarRoll.be Fact Sheet for Healthcare Providers: https://www.woods-mathews.com/ This test is not yet approved or cleared by the [place] FDA and  has been authorized for detection and/or diagnosis of SARS-CoV-2 by FDA under an Emergency Use Authorization (EUA). This EUA will remain  in effect (meaning this test can be used) for the duration of  the COVID-19 declaration under Section 56 4(b)(1) of the Act, 21 U.S.C. section 360bbb-3(b)(1), unless the authorization is terminated or revoked sooner. Performed at [facility], [place] [address], [place], [place] [address]      Time coordinating discharge: 35 minutes  SIGNED:   Louellen Molder, MD  [facility] 05/16/2019, 2:03 PM Pager   If 7PM-7AM, please contact night-coverage [email or web address] Password [email or web address]

## 2019-05-16 NOTE — Progress Notes (Signed)
Patient noted to have Copious amt of blood to LFA and under his gown , Staff RN Anderson Malta attempt to start IV x 3. IV team able to start in RFA , Blood was stopped and then started back at 1:45 notified Dr. Clementeen Graham .

## 2019-05-16 NOTE — Progress Notes (Addendum)
Subjective: 3 Days Post-Op Procedure(s) (LRB): OPEN REDUCTION INTERNAL FIXATION (ORIF) of closed  left periprosthetic supracondylar femur fracture. (Left) Patient reports pain as mild.   Patient is well, and has had no acute complaints or problems Plan for d/c to SNF, patient has chosen [facility] for discharge. Negative for chest pain and shortness of breath Fever: no Gastrointestinal:Negative for nausea and vomiting  Objective: Vital signs in last 24 hours: Temp:  [98.5 F (36.9 C)-98.6 F (37 C)] 98.6 F (37 C) (01/07 2321) Pulse Rate:  [62-68] 62 (01/07 2321) Resp:  [16-17] 17 (01/07 2321) BP: (120-134)/(60) 134/60 (01/07 2321) SpO2:  [95 %-97 %] 95 % (01/07 2321)  Intake/Output from previous day:  Intake/Output Summary (Last 24 hours) at 05/16/2019 0756 Last data filed at 05/16/2019 0300 Gross per 24 hour  Intake 750 ml  Output 700 ml  Net 50 ml    Intake/Output this shift: No intake/output data recorded.  Labs: Recent Labs    05/14/19 0446 05/15/19 0341 05/16/19 0234  HGB 9.4* 8.1* 7.8*   Recent Labs    05/15/19 0341 05/16/19 0234  WBC 11.1* 9.6  RBC 2.41* 2.31*  HCT 24.9* 23.5*  PLT 134* 147*   Recent Labs    05/15/19 0341 05/16/19 0234  NA 137 136  K 4.5 4.7  CL 107 107  CO2 22 21*  BUN 52* 56*  CREATININE 2.21* 1.92*  GLUCOSE 145* 165*  CALCIUM 7.9* 7.8*   No results for input(s): LABPT, INR in the last 72 hours.   EXAM General - Patient is Alert, Appropriate and Oriented Extremity - ABD soft Sensation intact distally Dorsiflexion/Plantar flexion intact  Lateral leg incisions with minimal bloody drainage, no erythema. Compartments to the left leg are soft. Dressing/Incision - ACE wrap and Knee ROM brace intact. Motor Function - intact, moving foot and toes well on exam.  Abdomen with normal bowel sounds this AM.  Past Medical History:  Diagnosis Date  . Atrial fibrillation ([place])   . CKD (chronic kidney disease), stage IIIa   . HTN  (hypertension)   . Type II diabetes mellitus with renal manifestations (HCC)     Assessment/Plan: 3 Days Post-Op Procedure(s) (LRB): OPEN REDUCTION INTERNAL FIXATION (ORIF) of closed  left periprosthetic supracondylar femur fracture. (Left) Principal Problem:   Closed fracture of left distal femur (HCC) Active Problems:   Type II diabetes mellitus with renal manifestations (HCC)   HTN (hypertension)   CKD (chronic kidney disease), stage IIIa   Atrial fibrillation (HCC)   CAD (coronary artery disease)   Chronic systolic CHF (congestive heart failure) (HCC)   Leukocytosis  Estimated body mass index is 32.07 kg/m as calculated from the following:   Height as of this encounter: 5\' 11"  (1.803 m).   Weight as of this encounter: 104.3 kg. Advance diet Up with therapy D/C IV fluids when tolerating po intake.  Labs reviewed this AM. WBC down to 9.6 this morning. Hg 7.9, denies any dizziness this morning.  Continue with PT, NWB to the left leg. Keep knee in extension until first follow-up appointment in 10-14 days. Begin working on BM.  Patient is passing gas without pain this AM.  Move on to FLEET enema today.  If discharged today, plan for follow-up with [facility] in 10-14 days for staple removal.  DVT Prophylaxis - Eliquis Non Weight-Bearing to the left leg  J. Cameron Proud, PA-C [facility] Orthopaedic Surgery 05/16/2019, 7:56 AM

## 2019-05-18 LAB — BPAM RBC
Blood Product Expiration Date: 202101282359
ISSUE DATE / TIME: 202101081150
Unit Type and Rh: 6200

## 2019-05-18 LAB — TYPE AND SCREEN
ABO/RH(D): A POS
Antibody Screen: NEGATIVE
Unit division: 0

## 2019-06-11 ENCOUNTER — Other Ambulatory Visit: Payer: Self-pay

## 2019-06-11 ENCOUNTER — Inpatient Hospital Stay: Payer: Medicare Other

## 2019-06-11 ENCOUNTER — Inpatient Hospital Stay
Admission: EM | Admit: 2019-06-11 | Discharge: 2019-06-16 | DRG: 682 | Disposition: A | Discharge status: Skilled Nursing Facility | Payer: Medicare Other | Attending: Internal Medicine | Admitting: Internal Medicine

## 2019-06-11 ENCOUNTER — Encounter: Payer: Self-pay | Admitting: *Deleted

## 2019-06-11 DIAGNOSIS — Z20822 Contact with and (suspected) exposure to covid-19: Secondary | ICD-10-CM | POA: Diagnosis present

## 2019-06-11 DIAGNOSIS — G9341 Metabolic encephalopathy: Secondary | ICD-10-CM

## 2019-06-11 DIAGNOSIS — I5022 Chronic systolic (congestive) heart failure: Secondary | ICD-10-CM | POA: Diagnosis present

## 2019-06-11 DIAGNOSIS — Z8781 Personal history of (healed) traumatic fracture: Secondary | ICD-10-CM

## 2019-06-11 DIAGNOSIS — N281 Cyst of kidney, acquired: Secondary | ICD-10-CM | POA: Diagnosis present

## 2019-06-11 DIAGNOSIS — Z794 Long term (current) use of insulin: Secondary | ICD-10-CM | POA: Diagnosis not present

## 2019-06-11 DIAGNOSIS — N1831 Chronic kidney disease, stage 3a: Secondary | ICD-10-CM | POA: Diagnosis present

## 2019-06-11 DIAGNOSIS — Z79899 Other long term (current) drug therapy: Secondary | ICD-10-CM

## 2019-06-11 DIAGNOSIS — E1165 Type 2 diabetes mellitus with hyperglycemia: Secondary | ICD-10-CM

## 2019-06-11 DIAGNOSIS — Z7901 Long term (current) use of anticoagulants: Secondary | ICD-10-CM

## 2019-06-11 DIAGNOSIS — B9689 Other specified bacterial agents as the cause of diseases classified elsewhere: Secondary | ICD-10-CM | POA: Diagnosis present

## 2019-06-11 DIAGNOSIS — E872 Acidosis, unspecified: Secondary | ICD-10-CM

## 2019-06-11 DIAGNOSIS — N179 Acute kidney failure, unspecified: Principal | ICD-10-CM | POA: Diagnosis present

## 2019-06-11 DIAGNOSIS — I10 Essential (primary) hypertension: Secondary | ICD-10-CM | POA: Diagnosis present

## 2019-06-11 DIAGNOSIS — N183 Chronic kidney disease, stage 3 unspecified: Secondary | ICD-10-CM | POA: Diagnosis present

## 2019-06-11 DIAGNOSIS — I4891 Unspecified atrial fibrillation: Secondary | ICD-10-CM | POA: Diagnosis present

## 2019-06-11 DIAGNOSIS — R338 Other retention of urine: Secondary | ICD-10-CM

## 2019-06-11 DIAGNOSIS — E860 Dehydration: Secondary | ICD-10-CM | POA: Diagnosis present

## 2019-06-11 DIAGNOSIS — E1121 Type 2 diabetes mellitus with diabetic nephropathy: Secondary | ICD-10-CM | POA: Diagnosis present

## 2019-06-11 DIAGNOSIS — F0390 Unspecified dementia without behavioral disturbance: Secondary | ICD-10-CM | POA: Diagnosis present

## 2019-06-11 DIAGNOSIS — E870 Hyperosmolality and hypernatremia: Secondary | ICD-10-CM | POA: Diagnosis not present

## 2019-06-11 DIAGNOSIS — I959 Hypotension, unspecified: Secondary | ICD-10-CM

## 2019-06-11 DIAGNOSIS — Z96652 Presence of left artificial knee joint: Secondary | ICD-10-CM | POA: Diagnosis present

## 2019-06-11 DIAGNOSIS — N138 Other obstructive and reflux uropathy: Secondary | ICD-10-CM | POA: Diagnosis present

## 2019-06-11 DIAGNOSIS — E875 Hyperkalemia: Secondary | ICD-10-CM | POA: Diagnosis not present

## 2019-06-11 DIAGNOSIS — N390 Urinary tract infection, site not specified: Secondary | ICD-10-CM | POA: Diagnosis present

## 2019-06-11 DIAGNOSIS — E1122 Type 2 diabetes mellitus with diabetic chronic kidney disease: Secondary | ICD-10-CM | POA: Diagnosis present

## 2019-06-11 DIAGNOSIS — N189 Chronic kidney disease, unspecified: Secondary | ICD-10-CM

## 2019-06-11 DIAGNOSIS — L89150 Pressure ulcer of sacral region, unstageable: Secondary | ICD-10-CM | POA: Diagnosis present

## 2019-06-11 DIAGNOSIS — S7292XD Unspecified fracture of left femur, subsequent encounter for closed fracture with routine healing: Secondary | ICD-10-CM

## 2019-06-11 DIAGNOSIS — I2510 Atherosclerotic heart disease of native coronary artery without angina pectoris: Secondary | ICD-10-CM | POA: Diagnosis present

## 2019-06-11 DIAGNOSIS — N401 Enlarged prostate with lower urinary tract symptoms: Secondary | ICD-10-CM | POA: Diagnosis present

## 2019-06-11 DIAGNOSIS — S72453A Displaced supracondylar fracture without intracondylar extension of lower end of unspecified femur, initial encounter for closed fracture: Secondary | ICD-10-CM

## 2019-06-11 DIAGNOSIS — I130 Hypertensive heart and chronic kidney disease with heart failure and stage 1 through stage 4 chronic kidney disease, or unspecified chronic kidney disease: Secondary | ICD-10-CM | POA: Diagnosis present

## 2019-06-11 DIAGNOSIS — Z7982 Long term (current) use of aspirin: Secondary | ICD-10-CM

## 2019-06-11 DIAGNOSIS — Z87891 Personal history of nicotine dependence: Secondary | ICD-10-CM

## 2019-06-11 DIAGNOSIS — R4182 Altered mental status, unspecified: Secondary | ICD-10-CM

## 2019-06-11 DIAGNOSIS — Z9889 Other specified postprocedural states: Secondary | ICD-10-CM

## 2019-06-11 LAB — COMPREHENSIVE METABOLIC PANEL
ALT: 58 U/L — ABNORMAL HIGH (ref 0–44)
AST: 48 U/L — ABNORMAL HIGH (ref 15–41)
Albumin: 2.6 g/dL — ABNORMAL LOW (ref 3.5–5.0)
Alkaline Phosphatase: 200 U/L — ABNORMAL HIGH (ref 38–126)
Anion gap: 12 (ref 5–15)
BUN: 161 mg/dL — ABNORMAL HIGH (ref 8–23)
CO2: 16 mmol/L — ABNORMAL LOW (ref 22–32)
Calcium: 8.9 mg/dL (ref 8.9–10.3)
Chloride: 109 mmol/L (ref 98–111)
Creatinine, Ser: 7.09 mg/dL — ABNORMAL HIGH (ref 0.61–1.24)
GFR calc Af Amer: 7 mL/min — ABNORMAL LOW (ref >60)
GFR calc non Af Amer: 6 mL/min — ABNORMAL LOW (ref >60)
Glucose, Bld: 312 mg/dL — ABNORMAL HIGH (ref 70–99)
Potassium: 5.8 mmol/L — ABNORMAL HIGH (ref 3.5–5.1)
Sodium: 137 mmol/L (ref 135–145)
Total Bilirubin: 0.9 mg/dL (ref 0.3–1.2)
Total Protein: 7 g/dL (ref 6.5–8.1)

## 2019-06-11 LAB — URINALYSIS, COMPLETE (UACMP) WITH MICROSCOPIC
Bilirubin Urine: NEGATIVE
Glucose, UA: NEGATIVE mg/dL
Ketones, ur: NEGATIVE mg/dL
Nitrite: NEGATIVE
Protein, ur: 100 mg/dL — AB
Specific Gravity, Urine: 1.011 (ref 1.005–1.030)
Squamous Epithelial / HPF: NONE SEEN (ref 0–5)
WBC, UA: >50 WBC/hpf — ABNORMAL HIGH (ref 0–5)
pH: 5 (ref 5.0–8.0)

## 2019-06-11 LAB — CBC
HCT: 37.1 % — ABNORMAL LOW (ref 39.0–52.0)
Hemoglobin: 12.1 g/dL — ABNORMAL LOW (ref 13.0–17.0)
MCH: 33.6 pg (ref 26.0–34.0)
MCHC: 32.6 g/dL (ref 30.0–36.0)
MCV: 103.1 fL — ABNORMAL HIGH (ref 80.0–100.0)
Platelets: 208 10*3/uL (ref 150–400)
RBC: 3.6 MIL/uL — ABNORMAL LOW (ref 4.22–5.81)
RDW: 14.4 % (ref 11.5–15.5)
WBC: 10 10*3/uL (ref 4.0–10.5)
nRBC: 0 % (ref 0.0–0.2)

## 2019-06-11 LAB — RESPIRATORY PANEL BY RT PCR (FLU A&B, COVID)
Influenza A by PCR: NEGATIVE
Influenza B by PCR: NEGATIVE
SARS Coronavirus 2 by RT PCR: NEGATIVE

## 2019-06-11 LAB — BRAIN NATRIURETIC PEPTIDE: B Natriuretic Peptide: 134 pg/mL — ABNORMAL HIGH (ref 0.0–100.0)

## 2019-06-11 MED ORDER — INSULIN ASPART 100 UNIT/ML ~~LOC~~ SOLN
0.0000 [IU] | Freq: Three times a day (TID) | SUBCUTANEOUS | Status: DC
  Administered 2019-06-12 – 2019-06-13 (×4): 3 [IU] via SUBCUTANEOUS
  Administered 2019-06-13: 7 [IU] via SUBCUTANEOUS
  Administered 2019-06-13: 5 [IU] via SUBCUTANEOUS
  Administered 2019-06-14: 3 [IU] via SUBCUTANEOUS
  Administered 2019-06-14: 5 [IU] via SUBCUTANEOUS
  Filled 2019-06-11 (×8): qty 1

## 2019-06-11 MED ORDER — SODIUM CHLORIDE 0.45 % IV SOLN
INTRAVENOUS | Status: DC
  Filled 2019-06-11 (×3): qty 1000

## 2019-06-11 MED ORDER — ACETAMINOPHEN 650 MG RE SUPP: 650.0000 mg | Freq: Four times a day (QID) | RECTAL | Status: DC | PRN

## 2019-06-11 MED ORDER — ACETAMINOPHEN 325 MG PO TABS: 650.0000 mg | ORAL_TABLET | Freq: Four times a day (QID) | ORAL | Status: DC | PRN

## 2019-06-11 MED ORDER — PATIROMER SORBITEX CALCIUM 8.4 G PO PACK
16.8000 g | PACK | Freq: Every day | ORAL | Status: DC
  Administered 2019-06-12 – 2019-06-16 (×4): 16.8 g via ORAL
  Filled 2019-06-11 (×6): qty 2

## 2019-06-11 MED ORDER — SODIUM CHLORIDE 0.9 % IV BOLUS
1000.0000 mL | Freq: Once | INTRAVENOUS | Status: AC
  Administered 2019-06-11: 1000 mL via INTRAVENOUS

## 2019-06-11 MED ORDER — SODIUM CHLORIDE 0.9 % IV SOLN
1.0000 g | Freq: Once | INTRAVENOUS | Status: AC
  Administered 2019-06-12: 1 g via INTRAVENOUS
  Filled 2019-06-11: qty 10

## 2019-06-11 MED ORDER — ONDANSETRON HCL 4 MG PO TABS: 4.0000 mg | ORAL_TABLET | Freq: Four times a day (QID) | ORAL | Status: DC | PRN

## 2019-06-11 MED ORDER — ONDANSETRON HCL 4 MG/2ML IJ SOLN
4.0000 mg | Freq: Four times a day (QID) | INTRAMUSCULAR | Status: DC | PRN
  Administered 2019-06-15: 4 mg via INTRAVENOUS
  Filled 2019-06-11: qty 2

## 2019-06-11 MED ORDER — INSULIN ASPART 100 UNIT/ML ~~LOC~~ SOLN
0.0000 [IU] | Freq: Every day | SUBCUTANEOUS | Status: DC
  Administered 2019-06-12 – 2019-06-13 (×2): 3 [IU] via SUBCUTANEOUS
  Filled 2019-06-11 (×2): qty 1

## 2019-06-11 MED ORDER — SENNOSIDES-DOCUSATE SODIUM 8.6-50 MG PO TABS
1.0000 | ORAL_TABLET | Freq: Every evening | ORAL | Status: DC | PRN
  Administered 2019-06-14: 1 via ORAL
  Filled 2019-06-11: qty 1

## 2019-06-11 NOTE — ED Notes (Signed)
Pt sent to er for eval of abnormal labs.  Pt confused.  piccline placed today and unable to pull labs from it.  md aware.  Pt unable to answer questions.

## 2019-06-11 NOTE — H&P (Addendum)
History and Physical    Edward Strong:[id number] DOB: 11-12-1929 DOA: 06/11/2019  PCP: Toni Arthurs, NP   Patient coming from: Edward Strong I have personally briefly reviewed patient's old medical records in [place]  Chief Complaint: altered mental status, abnormal labs HPI: Edward Strong is a 86 y.o. male with medical history significant for insulin-dependent type 2 diabetes, CKD 3A, chronic A. fib on Eliquis, CAD, systolic heart failure, documented history of Hodgkin's lymphoma , recently underwent ORIF on 05/13/2019, postoperative course complicated by postoperative bleeding requiring 1 unit PRBC who also has a history severe urinary retention secondary to BPH who has been self-catheterizing four times a day until his recent discharge to rehab, who was sent to the emergency room by the nursing home where he was receiving rehab for abnormal labs.  Patient was reportedly lethargic and sleeping a lot and blood work revealed abnormal kidney function.  Most of the history was taken from his son, Edward Strong over the phone, who said that at baseline his father is independent and is able to self catheterize without problems.  He stated that his father had been doing well without complaints until he went to the nursing home.  He said he had been trying to communicate with the nursing home about the need for self-catheterization but he was not sure whether it was being done. ED Course: On arrival to the emergency room he was hypotensive at 90/50.  He was afebrile at 98.6, heart rate 85 respirations 18 with O2 sat 98% on room air.  Blood work notable for creatinine of 7.09 above his baseline of 1.8 with bicarb of 16 and potassium of 5.8.  Blood sugar was 312.  He was also noted to have elevated liver enzymes with AST 48, ALT 58 and alk phos 200.  Covid and flu test were negative, EKG showed A. Fib. Following admission, Foley catheter placement got a return of 2 L urine, turbid appearing.  Urinalysis  showed positive leukocyte esterase Review of Systems: Unable to perform due to altered mental status  Past Medical History:  Diagnosis Date  . Atrial fibrillation ([place])   . CKD (chronic kidney disease), stage IIIa   . HTN (hypertension)   . Type II diabetes mellitus with renal manifestations [facility])     Past Surgical History:  Procedure Laterality Date  . APPENDECTOMY    . ORIF FEMUR FRACTURE Left 05/13/2019   Procedure: OPEN REDUCTION INTERNAL FIXATION (ORIF) of closed  left periprosthetic supracondylar femur fracture.;  Surgeon: Corky Mull, MD;  Location: [facility] ORS;  Service: Orthopedics;  Laterality: Left;  . REPLACEMENT TOTAL KNEE Left      reports that he has quit smoking. He has never used smokeless tobacco. He reports previous drug use. He reports that he does not drink alcohol.  Allergies  Allergen Reactions  . Bee Venom Other (See Comments)  . Ezetimibe Other (See Comments)  . Fenofibrate Micronized Other (See Comments)  . Pravastatin Other (See Comments)  . Sulfa Antibiotics Other (See Comments)    No family history on file.   Prior to Admission medications   Medication Sig Start Date End Date Taking? Authorizing Provider  apixaban (ELIQUIS) 2.5 MG TABS tablet Take 2.5 mg by mouth 2 (two) times daily.    [provider]  aspirin 81 MG chewable tablet Chew 81 mg by mouth daily.    [provider]  bisacodyl (DULCOLAX) 10 MG suppository Place 1 suppository (10 mg total) rectally daily  as needed for moderate constipation. 05/16/19   Dhungel, Flonnie Overman, MD  Ensure Max Protein (ENSURE MAX PROTEIN) LIQD Take 330 mLs (11 oz total) by mouth 2 (two) times daily between meals. 05/16/19   Dhungel, Flonnie Overman, MD  ferrous sulfate 325 (65 FE) MG tablet Take 1 tablet (325 mg total) by mouth 2 (two) times daily with a meal. 05/16/19 05/15/20  Dhungel, Nishant, MD  furosemide (LASIX) 40 MG tablet Take 40 mg by mouth.    [provider]  insulin NPH Human (NOVOLIN N) 100  UNIT/ML injection Inject 0.2 mLs (20 Units total) into the skin every evening. 05/16/19   Dhungel, Nishant, MD  insulin regular (NOVOLIN R) 100 units/mL injection Inject 0.05 mLs (5 Units total) into the skin 3 (three) times daily before meals. 05/16/19   Dhungel, Flonnie Overman, MD  losartan (COZAAR) 100 MG tablet Take 100 mg by mouth daily. 04/15/18   [provider]  oxyCODONE (OXY IR/ROXICODONE) 5 MG immediate release tablet Take 1 tablet (5 mg total) by mouth every 4 (four) hours as needed for moderate pain (pain score 4-6). 05/16/19   Dhungel, Nishant, MD  polyethylene glycol (MIRALAX / GLYCOLAX) 17 g packet Take 17 g by mouth daily. 05/17/19   Louellen Molder, MD    Physical Exam: Vitals:   06/11/19 1830 06/11/19 1831  BP: (!) 90/50   Pulse: 85   Resp: 18   Temp: 98.6 F (37 C)   TempSrc: Oral   SpO2: 98%   Weight:  81.6 kg  Height:  6' (1.829 m)     Vitals:   06/11/19 1830 06/11/19 1831  BP: (!) 90/50   Pulse: 85   Resp: 18   Temp: 98.6 F (37 C)   TempSrc: Oral   SpO2: 98%   Weight:  81.6 kg  Height:  6' (1.829 m)    Constitutional: NAD, very somnolent.  Arouses to name but falls back asleep  eyes:  PERRL, lids and conjunctivae normal ENMT: Mucous membranes are moist.  Neck: normal, supple, no masses, no thyromegaly Respiratory: clear to auscultation bilaterally, no wheezing, no crackles. Normal respiratory effort. No accessory muscle use.  Cardiovascular: Regular rate and rhythm, no murmurs / rubs / gallops. No extremity edema. 2+ pedal pulses. No carotid bruits.  Abdomen: no tenderness, no masses palpated. No hepatosplenomegaly. Bowel sounds positive.  Musculoskeletal: no clubbing / cyanosis. No joint deformity upper and lower extremities.  Skin: no rashes, lesions, ulcers.  Neurologic: No gross focal neurologic deficit. Psychiatric: Unable to assess due to altered mental status  Labs on Admission: I have personally reviewed following labs and imaging  studies  CBC: Recent Labs  Lab 06/11/19 1827  WBC 10.0  HGB 12.1*  HCT 37.1*  MCV 103.1*  PLT 875   Basic Metabolic Panel: Recent Labs  Lab 06/11/19 1827  NA 137  K 5.8*  CL 109  CO2 16*  GLUCOSE 312*  BUN 161*  CREATININE 7.09*  CALCIUM 8.9   GFR: Estimated Creatinine Clearance: 8.1 mL/min (A) (by C-G formula based on SCr of 7.09 mg/dL (H)). Liver Function Tests: Recent Labs  Lab 06/11/19 1827  AST 48*  ALT 58*  ALKPHOS 200*  BILITOT 0.9  PROT 7.0  ALBUMIN 2.6*   No results for input(s): LIPASE, AMYLASE in the last 168 hours. No results for input(s): AMMONIA in the last 168 hours. Coagulation Profile: No results for input(s): INR, PROTIME in the last 168 hours. Cardiac Enzymes: No results for input(s): CKTOTAL, CKMB, CKMBINDEX, TROPONINI  in the last 168 hours. BNP (last 3 results) No results for input(s): PROBNP in the last 8760 hours. HbA1C: No results for input(s): HGBA1C in the last 72 hours. CBG: No results for input(s): GLUCAP in the last 168 hours. Lipid Profile: No results for input(s): CHOL, HDL, LDLCALC, TRIG, CHOLHDL, LDLDIRECT in the last 72 hours. Thyroid Function Tests: No results for input(s): TSH, T4TOTAL, FREET4, T3FREE, THYROIDAB in the last 72 hours. Anemia Panel: No results for input(s): VITAMINB12, FOLATE, FERRITIN, TIBC, IRON, RETICCTPCT in the last 72 hours. Urine analysis:    Component Value Date/Time   COLORURINE AMBER (A) 05/13/2019 0250   APPEARANCEUR CLOUDY (A) 05/13/2019 0250   LABSPEC 1.018 05/13/2019 0250   PHURINE 7.0 05/13/2019 0250   GLUCOSEU NEGATIVE 05/13/2019 0250   HGBUR NEGATIVE 05/13/2019 0250   BILIRUBINUR NEGATIVE 05/13/2019 0250   KETONESUR 5 (A) 05/13/2019 0250   PROTEINUR 100 (A) 05/13/2019 0250   NITRITE NEGATIVE 05/13/2019 0250   LEUKOCYTESUR TRACE (A) 05/13/2019 0250    Radiological Exams on Admission: No results found.  EKG: Independently reviewed.   Assessment/Plan Principal Problem: Acute  kidney injury superimposed on chronic kidney disease IIIa (HCC) Metabolic acidosis Hyperkalemia Suspect mixed, obstructive related to acute on chronic urinary retention in patient dependent on self-catheterization as well as possibly due to dehydration  BUN/creatinine was 161/7.9 with baseline creatinine of 1.8 IV hydration with sodium bicarbonate solution to treat metabolic acidosis [place] 16.8 x1 ordered Nephrology consult made to Dr. Juleen China Follow-up renal ultrasound  Acute on chronic urinary retention BPH with LUTS -Urine retention of 2 L on placement of Foley catheter -At baseline patient self catheterizes 4 times daily for several years -Patient follows with a urologist in [place] and had a recent procedure that did not help with his chronic urinary retention -Discussed with Dr Erlene Quan, urologist via secure chat: "I really don't think there's anything for [place] to add in his care are the moment. Reviewed chart. If cr fails to improve with foley or if there are any other specific concerns, glad to see (like bleeding, catheter problems, etc). If the concern is just that he has chronic retention, not something that we can change or address during this admission anyway and recommend outpt f/u." -Outpatient referral back to urology as per recommendation   Urinary tract infection Rocephin 1 g daily Follow urine cultures  Hyperglycemia due to type 2 diabetes mellitus ([place]) Insulin-dependent type 2 diabetes Blood glucose was 312 Sliding scale insulin coverage for now. Hold home insulin regimen    Hypotension Suspect related to dehydration and metabolic derangements.  Follow lactic acid to evaluate for sepsis though Not quite yet meeting sepsis criteria, has no fever or tachycardia or tachypnea has a normal white cell count. Follow lactic acid Close hemodynamic monitoring IV hydration with monitoring for overload in view of history of systolic heart failure Hold home  antihypertensives Resume as appropriate but will avoid ACE/ARB Continue to monitor    Acute metabolic encephalopathy Secondary to dehydration and metabolic derangements related to AKI Treat as above Fall and aspiration precaution    Atrial fibrillation [facility]) Pharmacy to dose Eliquis based on poor renal function Patient is on chronic Eliquis Currently rate controlled.  Not on rate control agent    Chronic systolic CHF (congestive heart failure) (HCC) Hold Lasix and losartan.  Not currently on beta-blockers for uncertain reason Monitor for fluid overload in view of IV hydration    S/P ORIF (open reduction internal fixation) fracture Patient currently in rehab  No acute problems       DVT prophylaxis: lovenox Code Status: full code  Family Communication: son, Jaxiel Kines  Disposition Plan: Back to previous home environment Consults called: nephrology Dr Juleen China,    Athena Masse MD [facility]     06/11/2019, 9:03 PM

## 2019-06-11 NOTE — ED Provider Notes (Signed)
[facility] Emergency Department Provider Note  Time seen: 6:26 PM  I have reviewed the triage vital signs and the nursing notes.   HISTORY  Chief Complaint Abnormal labs  HPI Edward Strong is a 86 y.o. male with a past medical history of CKD, hypertension, diabetes, presents to the emergency department for abnormal lab work.  According EMS report patient has cognitive issues is not able to provide any history himself but is able to follow simple commands and tell you his name, they state the mental status is baseline for the patient.  They state however blood work is recently performed showing abnormal kidney function and the patient was referred to the emergency department for evaluation.  Lab work provided by EMS shows a creatinine of 6.8, record review shows his last creatinine was 1.9  05/16/2019.  Patient appears clinically dehydrated with dry mucous membranes.  Past Medical History:  Diagnosis Date  . Atrial fibrillation ([place])   . CKD (chronic kidney disease), stage IIIa   . HTN (hypertension)   . Type II diabetes mellitus with renal manifestations [facility])     Patient Active Problem List   Diagnosis Date Noted  . Postoperative anemia due to acute blood loss 05/16/2019  . Acute renal failure superimposed on stage 3a chronic kidney disease ([place])   . Closed fracture of left distal femur ([place]) 05/12/2019  . CAD (coronary artery disease) 05/12/2019  . Chronic systolic CHF (congestive heart failure) ([place]) 05/12/2019  . Leukocytosis 05/12/2019  . Type II diabetes mellitus with renal manifestations ([place])   . HTN (hypertension)   . CKD (chronic kidney disease), stage IIIa   . Atrial fibrillation [facility])     Past Surgical History:  Procedure Laterality Date  . APPENDECTOMY    . ORIF FEMUR FRACTURE Left 05/13/2019   Procedure: OPEN REDUCTION INTERNAL FIXATION (ORIF) of closed  left periprosthetic supracondylar femur fracture.;  Surgeon: Corky Mull, MD;  Location:  [facility] ORS;  Service: Orthopedics;  Laterality: Left;  . REPLACEMENT TOTAL KNEE Left     Prior to Admission medications   Medication Sig Start Date End Date Taking? Authorizing Provider  apixaban (ELIQUIS) 2.5 MG TABS tablet Take 2.5 mg by mouth 2 (two) times daily.    [provider]  aspirin 81 MG chewable tablet Chew 81 mg by mouth daily.    [provider]  bisacodyl (DULCOLAX) 10 MG suppository Place 1 suppository (10 mg total) rectally daily as needed for moderate constipation. 05/16/19   Dhungel, Flonnie Overman, MD  Ensure Max Protein (ENSURE MAX PROTEIN) LIQD Take 330 mLs (11 oz total) by mouth 2 (two) times daily between meals. 05/16/19   Dhungel, Flonnie Overman, MD  ferrous sulfate 325 (65 FE) MG tablet Take 1 tablet (325 mg total) by mouth 2 (two) times daily with a meal. 05/16/19 05/15/20  Dhungel, Nishant, MD  furosemide (LASIX) 40 MG tablet Take 40 mg by mouth.    [provider]  insulin NPH Human (NOVOLIN N) 100 UNIT/ML injection Inject 0.2 mLs (20 Units total) into the skin every evening. 05/16/19   Dhungel, Nishant, MD  insulin regular (NOVOLIN R) 100 units/mL injection Inject 0.05 mLs (5 Units total) into the skin 3 (three) times daily before meals. 05/16/19   Dhungel, Flonnie Overman, MD  losartan (COZAAR) 100 MG tablet Take 100 mg by mouth daily. 04/15/18   [provider]  oxyCODONE (OXY IR/ROXICODONE) 5 MG immediate release tablet Take 1 tablet (5 mg total) by mouth every 4 (  four) hours as needed for moderate pain (pain score 4-6). 05/16/19   Dhungel, Nishant, MD  polyethylene glycol (MIRALAX / GLYCOLAX) 17 g packet Take 17 g by mouth daily. 05/17/19   Dhungel, Flonnie Overman, MD    Allergies  Allergen Reactions  . Bee Venom Other (See Comments)  . Ezetimibe Other (See Comments)  . Fenofibrate Micronized Other (See Comments)  . Pravastatin Other (See Comments)  . Sulfa Antibiotics Other (See Comments)    No family history on file.  Social History Social History   Tobacco  Use  . Smoking status: Former Research scientist (life sciences)  . Smokeless tobacco: Never Used  Substance Use Topics  . Alcohol use: Never  . Drug use: Not Currently    Review of Systems Unable to obtain adequate/accurate review of systems secondary to baseline mental status. ____________________________________________   PHYSICAL EXAM:  Constitutional: Patient is awake and alert, oriented to person only.  Will answer questions with questionable accuracy will follow basic commands.  Calm and pleasant. Eyes: Normal exam ENT      Head: Normocephalic and atraumatic.      Mouth/Throat: Mucous membranes are moist. Cardiovascular: Normal rate, regular rhythm.  Respiratory: Normal respiratory effort without tachypnea nor retractions. Breath sounds are clear  Gastrointestinal: Soft and nontender. No distention.   Musculoskeletal: Nontender with normal range of motion in all extremities. Neurologic:  Normal speech and language. No gross focal neurologic deficits Skin:  Skin is warm, dry and intact.  Psychiatric: Mood and affect are normal.  ____________________________________________    EKG  EKG viewed and interpreted by myself shows atrial fibrillation at 78 bpm with a narrow QRS, left axis deviation, largely normal intervals nonspecific ST changes occasional PVC.   INITIAL IMPRESSION / ASSESSMENT AND PLAN / ED COURSE  Pertinent labs & imaging results that were available during my care of the patient were reviewed by me and considered in my medical decision making (see chart for details).   Patient presents to the emergency department for abnormal lab work.  Labs presented by EMS show a creatinine of 6.8, baseline around 1.9 less than a month ago indicating renal insufficiency.  Potassium is elevated to 6.3.  We will recheck labs begin with IV hydration.  Patient will likely require admission to the hospital if repeat lab work confirms outpatient labs.  Patient is awake alert cooperative pleasant but he is  disoriented and cannot provide an accurate history, EMS states this is baseline for the patient.  Lab work confirms acute renal failure compared to his baseline renal insufficiency.  We will place a Foley catheter we will continue to IV hydrate.  I discussed the patient with Dr. Juleen China of nephrology who will be by to see him in the morning.  Patient admitted to the hospitalist service.  Edward Strong was evaluated in Emergency Department on 06/11/2019 for the symptoms described in the history of present illness. He was evaluated in the context of the global COVID-19 pandemic, which necessitated consideration that the patient might be at risk for infection with the SARS-CoV-2 virus that causes COVID-19. Institutional protocols and algorithms that pertain to the evaluation of patients at risk for COVID-19 are in a state of rapid change based on information released by regulatory bodies including the CDC and federal and state organizations. These policies and algorithms were followed during the patient's care in the ED.  ____________________________________________   FINAL CLINICAL IMPRESSION(S) / ED DIAGNOSES  Acute renal failure   Harvest Dark, MD 06/11/19 2035

## 2019-06-11 NOTE — ED Notes (Signed)
Pt cleansed of incontinent stool and puss from penis. PT noted to have sacral wound. Cleaned wound and placed a sacral pad. Full bed change.

## 2019-06-11 NOTE — ED Triage Notes (Signed)
Pt brought in via ems from [place] home.  Pt has abnormal labs.  Pt awake but confused on arrival  Pt in hallway bed  md at bedside.

## 2019-06-11 NOTE — ED Notes (Signed)
Report off to paige rn  

## 2019-06-12 ENCOUNTER — Inpatient Hospital Stay: Payer: Medicare Other

## 2019-06-12 DIAGNOSIS — R338 Other retention of urine: Secondary | ICD-10-CM

## 2019-06-12 DIAGNOSIS — N390 Urinary tract infection, site not specified: Secondary | ICD-10-CM

## 2019-06-12 DIAGNOSIS — N138 Other obstructive and reflux uropathy: Secondary | ICD-10-CM

## 2019-06-12 LAB — BASIC METABOLIC PANEL
Anion gap: 12 (ref 5–15)
BUN: 167 mg/dL — ABNORMAL HIGH (ref 8–23)
CO2: 13 mmol/L — ABNORMAL LOW (ref 22–32)
Calcium: 8.7 mg/dL — ABNORMAL LOW (ref 8.9–10.3)
Chloride: 115 mmol/L — ABNORMAL HIGH (ref 98–111)
Creatinine, Ser: 6.9 mg/dL — ABNORMAL HIGH (ref 0.61–1.24)
GFR calc Af Amer: 8 mL/min — ABNORMAL LOW (ref >60)
GFR calc non Af Amer: 7 mL/min — ABNORMAL LOW (ref >60)
Glucose, Bld: 235 mg/dL — ABNORMAL HIGH (ref 70–99)
Potassium: 5.5 mmol/L — ABNORMAL HIGH (ref 3.5–5.1)
Sodium: 140 mmol/L (ref 135–145)

## 2019-06-12 LAB — GLUCOSE, CAPILLARY
Glucose-Capillary: 201 mg/dL — ABNORMAL HIGH (ref 70–99)
Glucose-Capillary: 222 mg/dL — ABNORMAL HIGH (ref 70–99)
Glucose-Capillary: 246 mg/dL — ABNORMAL HIGH (ref 70–99)
Glucose-Capillary: 278 mg/dL — ABNORMAL HIGH (ref 70–99)

## 2019-06-12 LAB — COMPREHENSIVE METABOLIC PANEL
ALT: 46 U/L — ABNORMAL HIGH (ref 0–44)
AST: 28 U/L (ref 15–41)
Albumin: 2.3 g/dL — ABNORMAL LOW (ref 3.5–5.0)
Alkaline Phosphatase: 168 U/L — ABNORMAL HIGH (ref 38–126)
Anion gap: 12 (ref 5–15)
BUN: 159 mg/dL — ABNORMAL HIGH (ref 8–23)
CO2: 14 mmol/L — ABNORMAL LOW (ref 22–32)
Calcium: 8.7 mg/dL — ABNORMAL LOW (ref 8.9–10.3)
Chloride: 115 mmol/L — ABNORMAL HIGH (ref 98–111)
Creatinine, Ser: 6.52 mg/dL — ABNORMAL HIGH (ref 0.61–1.24)
GFR calc Af Amer: 8 mL/min — ABNORMAL LOW (ref >60)
GFR calc non Af Amer: 7 mL/min — ABNORMAL LOW (ref >60)
Glucose, Bld: 216 mg/dL — ABNORMAL HIGH (ref 70–99)
Potassium: 5.3 mmol/L — ABNORMAL HIGH (ref 3.5–5.1)
Sodium: 141 mmol/L (ref 135–145)
Total Bilirubin: 0.9 mg/dL (ref 0.3–1.2)
Total Protein: 6.1 g/dL — ABNORMAL LOW (ref 6.5–8.1)

## 2019-06-12 LAB — LACTIC ACID, PLASMA
Lactic Acid, Venous: 2 mmol/L (ref 0.5–1.9)
Lactic Acid, Venous: 1.6 mmol/L (ref 0.5–1.9)

## 2019-06-12 LAB — CBC
HCT: 33.7 % — ABNORMAL LOW (ref 39.0–52.0)
Hemoglobin: 10.8 g/dL — ABNORMAL LOW (ref 13.0–17.0)
MCH: 33.4 pg (ref 26.0–34.0)
MCHC: 32 g/dL (ref 30.0–36.0)
MCV: 104.3 fL — ABNORMAL HIGH (ref 80.0–100.0)
Platelets: 187 10*3/uL (ref 150–400)
RBC: 3.23 MIL/uL — ABNORMAL LOW (ref 4.22–5.81)
RDW: 14.3 % (ref 11.5–15.5)
WBC: 8.8 10*3/uL (ref 4.0–10.5)
nRBC: 0 % (ref 0.0–0.2)

## 2019-06-12 MED ORDER — CHLORHEXIDINE GLUCONATE CLOTH 2 % EX PADS
6.0000 | MEDICATED_PAD | Freq: Every day | CUTANEOUS | Status: DC
  Administered 2019-06-12 – 2019-06-16 (×4): 6 via TOPICAL

## 2019-06-12 MED ORDER — SODIUM CHLORIDE 0.45 % IV SOLN: INTRAVENOUS | Status: DC

## 2019-06-12 MED ORDER — APIXABAN 2.5 MG PO TABS
2.5000 mg | ORAL_TABLET | Freq: Two times a day (BID) | ORAL | Status: DC
  Administered 2019-06-12 – 2019-06-16 (×9): 2.5 mg via ORAL
  Filled 2019-06-12 (×9): qty 1

## 2019-06-12 MED ORDER — SODIUM BICARBONATE 8.4 % IV SOLN
INTRAVENOUS | Status: DC
  Filled 2019-06-12 (×2): qty 150

## 2019-06-12 MED ORDER — SODIUM CHLORIDE 0.9 % IV SOLN
1.0000 g | INTRAVENOUS | Status: DC
  Administered 2019-06-12 – 2019-06-13 (×2): 1 g via INTRAVENOUS
  Filled 2019-06-12: qty 10
  Filled 2019-06-12 (×2): qty 1

## 2019-06-12 MED ORDER — SODIUM BICARBONATE-DEXTROSE 150-5 MEQ/L-% IV SOLN
150.0000 meq | INTRAVENOUS | Status: DC
  Administered 2019-06-12 – 2019-06-13 (×3): 150 meq via INTRAVENOUS
  Filled 2019-06-12 (×6): qty 1000

## 2019-06-12 MED ORDER — SODIUM CHLORIDE 0.9 % IV BOLUS
500.0000 mL | Freq: Once | INTRAVENOUS | Status: AC
  Administered 2019-06-12: 500 mL via INTRAVENOUS

## 2019-06-12 NOTE — Progress Notes (Signed)
[facility] Progress Note  Patient: Edward Strong    CBU:[id number]  DOA: 06/11/2019     Date of Service: the patient was seen and examined on 06/12/2019  Chief Complaint  Patient presents with  . Abnormal Lab   Brief hospital course: Chief Complaint: altered mental status, abnormal labs  Edward Strong is a 86 y.o. male with medical history significant for insulin-dependent type 2 diabetes, CKD 3A, chronic A. fib on Eliquis, CAD, systolic heart failure, documented history of Hodgkin's lymphoma , recently underwent ORIF on 05/13/2019, postoperative course complicated by postoperative bleeding requiring 1 unit PRBC who also has a history severe urinary retention secondary to BPH who has been self-catheterizing four times a day until his recent discharge to rehab, who was sent to the emergency room by the nursing home where he was receiving rehab for abnormal labs.  Patient was reportedly lethargic and sleeping a lot and blood work revealed abnormal kidney function.  Most of the history was taken from his son, Edward Strong, who said that at baseline his father is independent and is able to self catheterize without problems.  He stated that his father had been doing well without complaints until he went to the nursing home.  He said he had been trying to communicate with the nursing home about the need for self-catheterization but he was not sure whether it was being done. ED Course: On arrival to the emergency room he was hypotensive at 90/50.  He was afebrile at 98.6, heart rate 85 respirations 18 with O2 sat 98% on room air.  Blood work notable for creatinine of 7.09 above his baseline of 1.8 with bicarb of 16 and potassium of 5.8.  Blood sugar was 312.  He was also noted to have elevated liver enzymes with AST 48, ALT 58 and alk phos 200.  Covid and flu test were negative, EKG showed A. Fib. Following admission, Foley catheter placement got a return of 2 L urine, turbid appearing.   Urinalysis showed positive leukocyte esterase  Currently further plan is just to monitor renal functions and follow nephrologist for further management  Assessment and Plan:  # Acute kidney injury superimposed on chronic kidney disease IIIa  Metabolic acidosis Hyperkalemia Suspect mixed, obstructive related to acute on chronic urinary retention in patient dependent on self-catheterization as well as possibly due to dehydration  BUN/creatinine was 161/7.9 with baseline creatinine of 1.8 IV hydration with sodium bicarbonate solution to treat metabolic acidosis [place] 16.8  orally daily until hyperkalemia Nephrology consulted Dr. Juleen China is following Renal ultrasound: Bilateral simple renal cysts are noted, the largest measuring 10.5 cm on the left. No other renal abnormality is noted.  Acute on chronic urinary retention BPH with LUTS -Urine retention of 2 L on placement of Foley catheter -At baseline patient self catheterizes 4 times daily for several years -Patient follows with a urologist in [place] and had a recent procedure that did not help with his chronic urinary retention -Discussed with Dr Erlene Quan, urologist via secure chat: "I really don't think there's anything for [place] to add in his care are the moment. Reviewed chart. If cr fails to improve with foley or if there are any other specific concerns, glad to see (like bleeding, catheter problems, etc). If the concern is just that he has chronic retention, not something that we can change or address during this admission anyway and recommend outpt f/u." -Outpatient referral back to urology as per recommendation   Urinary tract  infection Rocephin 1 g daily Follow urine cultures pending  Hyperglycemia due to type 2 diabetes mellitus ([place]) Insulin-dependent type 2 diabetes Blood glucose was 312 Sliding scale insulin coverage for now. Hold home insulin regimen    Hypotension Suspect related to dehydration and  metabolic derangements.  Follow lactic acid to evaluate for sepsis though Not quite yet meeting sepsis criteria, has no fever or tachycardia or tachypnea has a normal white cell count. lactic acid 2.0>1.6 trended down Close hemodynamic monitoring IV hydration with monitoring for overload in view of history of systolic heart failure Hold home antihypertensives Resume as appropriate but will avoid ACE/ARB Continue to monitor    Acute metabolic encephalopathy Secondary to dehydration and metabolic derangements related to AKI Treat as above Fall and aspiration precaution    Atrial fibrillation ([place]) Pharmacy to dose Eliquis 2.5 mg p.o. twice daily started on 06/12/2019 Patient is on chronic Eliquis Currently rate controlled.  Not on rate control agent    Chronic systolic CHF (congestive heart failure) (HCC) Hold Lasix and losartan.  Not currently on beta-blockers for uncertain reason Monitor for fluid overload in view of IV hydration    S/P ORIF (open reduction internal fixation) fracture Patient currently in rehab No acute problems   Body mass index is 29.13 kg/m.    Interventions:    Pressure Injury 06/11/19 Sacrum Mid Unstageable - Full thickness tissue loss in which the base of the injury is covered by slough (yellow, tan, gray, green or brown) and/or eschar (tan, brown or black) in the wound bed. (Active)  06/11/19   Location: Sacrum  Location Orientation: Mid  Staging: Unstageable - Full thickness tissue loss in which the base of the injury is covered by slough (yellow, tan, gray, green or brown) and/or eschar (tan, brown or black) in the wound bed.  Wound Description (Comments):   Present on Admission: Yes     Diet: Heart healthy and diabetic diet DVT Prophylaxis: Therapeutic Anticoagulation with Eliquis   Advance goals of care discussion: Full code  Family Communication: No one from family was present at bedside, at the time of interview.  The pt provided  permission to discuss medical plan with the family. Opportunity was given to ask question and all questions were answered satisfactorily.   Disposition:  Pt is from Home, admitted with renal failure, still has renal failure and metabolic acidosis, which precludes a safe discharge. Discharge to home with home health possibly, when renal functions improve and cleared by nephrologist.  Subjective: Patient was seen and examined at bedside in the morning, no overnight issues.  Patient seems to be confused her dementia AAO x1-2 Patient does not know how he came to the hospital and does not know why he is in the hospital.  Unable to offer any complaints.  Patient was explained that he got renal failure, I doubt whether he would remember the conversation.   Physical Exam: General:  alert oriented to place and person.  Appear in mild distress, affect seems confused Eyes: PERRLA ENT: Oral Mucosa Clear, moist  Neck: no JVD,  Cardiovascular: S1 and S2 Present, no Murmur,  Respiratory: good respiratory effort, Bilateral Air entry equal and Decreased, no Crackles, no wheezes Abdomen: Bowel Sound present, Soft and no tenderness,  Skin: no rashes Extremities: no Pedal edema, no calf tenderness Neurologic: without any new focal findings Gait not checked due to patient safety concerns  Vitals:   06/12/19 0257 06/12/19 0259 06/12/19 0525 06/12/19 1422  BP: (!) 107/41  Marland Kitchen)  131/106 (!) 102/51  Pulse: 69 76 (!) 105 61  Resp:   20 18  Temp:    (!) 97.4 F (36.3 C)  TempSrc:    Oral  SpO2:  100% 96% 100%  Weight:      Height:        Intake/Output Summary (Last 24 hours) at 06/12/2019 1555 Last data filed at 06/12/2019 1427 Gross per 24 hour  Intake 1013.21 ml  Output 4300 ml  Net -3286.79 ml   Filed Weights   06/11/19 1831 06/11/19 2324  Weight: 81.6 kg 92.1 kg    Data Reviewed: I have personally reviewed and interpreted daily labs, tele strips, imagings as discussed above. I reviewed all  nursing notes, pharmacy notes, vitals, pertinent old records I have discussed plan of care as described above with RN and patient/family.  CBC: Recent Labs  Lab 06/11/19 1827 06/12/19 0351  WBC 10.0 8.8  HGB 12.1* 10.8*  HCT 37.1* 33.7*  MCV 103.1* 104.3*  PLT 208 381   Basic Metabolic Panel: Recent Labs  Lab 06/11/19 1827 06/12/19 0003 06/12/19 0351  NA 137 140 141  K 5.8* 5.5* 5.3*  CL 109 115* 115*  CO2 16* 13* 14*  GLUCOSE 312* 235* 216*  BUN 161* 167* 159*  CREATININE 7.09* 6.90* 6.52*  CALCIUM 8.9 8.7* 8.7*    Studies: US RENAL  Result Date: 06/12/2019 CLINICAL DATA:  Acute renal failure. EXAM: RENAL / URINARY TRACT ULTRASOUND COMPLETE COMPARISON:  None. FINDINGS: Right Kidney: Renal measurements: 11.6 x 5.3 x 6.4 cm = volume: 199 mL. At least 3 simple cysts are noted, the largest measuring 3.3 cm in lower pole. Echogenicity within normal limits. No mass or hydronephrosis visualized. Left Kidney: Renal measurements: 13.9 x 5.1 x 6.0 cm = volume: 223 mL. Several cysts are noted, the largest measuring 10.5 cm. Echogenicity within normal limits. No hydronephrosis visualized. Bladder: Decompressed secondary to Foley catheter. Other: None. IMPRESSION: Bilateral simple renal cysts are noted, the largest measuring 10.5 cm on the left. No other renal abnormality is noted. Electronically Signed   By: Marijo Conception M.D.   On: 06/12/2019 12:29   DG Chest Port 1 View  Result Date: 06/11/2019 CLINICAL DATA:  Weakness EXAM: PORTABLE CHEST 1 VIEW COMPARISON:  05/12/2019 FINDINGS: Cardiac shadow is enlarged but stable. Aortic calcifications are again seen. Calcified granuloma is again noted in the right mid lung. Postsurgical changes in the right lung are seen as well with basilar scarring. Calcified hilar and mediastinal lymph nodes are seen. No bony abnormality is noted. IMPRESSION: Changes consistent with prior granulomatous disease. No acute bony abnormality is seen. Postsurgical  change with chronic scarring in the right base. Electronically Signed   By: Inez Catalina M.D.   On: 06/11/2019 22:46    Scheduled Meds: . apixaban  2.5 mg Oral BID  . Chlorhexidine Gluconate Cloth  6 each Topical Daily  . insulin aspart  0-5 Units Subcutaneous QHS  . insulin aspart  0-9 Units Subcutaneous TID WC  . patiromer  16.8 g Oral Daily   Continuous Infusions: . cefTRIAXone (ROCEPHIN)  IV    . sodium bicarbonate 150 mEq in dextrose 5% 1000 mL 150 mEq (06/12/19 1256)   PRN Meds: acetaminophen **OR** acetaminophen, ondansetron **OR** ondansetron (ZOFRAN) IV, senna-docusate  Time spent: 35 minutes  Author: Val Riles. MD [facility] 06/12/2019 3:55 PM  To reach On-call, see care teams to locate the attending and reach out to them via [email or web address]. If 7PM-7AM, please contact  night-coverage If you still have difficulty reaching the attending provider, please page the [facility] (Director on Call) for [facility] on amion for assistance.

## 2019-06-12 NOTE — Progress Notes (Signed)
Inpatient Diabetes Program Recommendations  AACE/ADA: New Consensus Statement on Inpatient Glycemic Control (2015)  Target Ranges:  Prepandial:   less than 140 mg/dL      Peak postprandial:   less than 180 mg/dL (1-2 hours)      Critically ill patients:  140 - 180 mg/dL   Lab Results  Component Value Date   GLUCAP 222 (H) 06/12/2019   HGBA1C 7.8 (H) 05/12/2019    Review of Glycemic Control Results for Edward Strong, Edward Strong (MRN [id number]) as of 06/12/2019 13:14  Ref. Range 06/12/2019 07:42 06/12/2019 12:57  Glucose-Capillary Latest Ref Range: 70 - 99 mg/dL 201 (H) 222 (H)   Diabetes history: DM 2 Outpatient Diabetes medications:  NPH 20 units q PM, Novolin R 5 units tid with meals Current orders for Inpatient glycemic control:  Novolog sensitive tid with meals and HS Inpatient Diabetes Program Recommendations:    Please add Lantus 10 units daily while in the hospital.   Thanks,  Adah Perl, RN, BC-ADM Inpatient Diabetes Coordinator Pager [phone] (8a-5p)

## 2019-06-12 NOTE — Consult Note (Signed)
[facility]  CONSULT NOTE    Date: 06/12/2019                  Patient Name:  Edward Strong  MRN: [id number]  DOB: [birth year]  Age / Sex: 86 y.o., male         PCP: Toni Arthurs, NP                 Service Requesting Consult: Dr. Dwyane Dee                 Reason for Consult: Acute renal failure            History of Present Illness: Edward Strong admitted to [facility] with hypotension. Found to have acute renal failure and foley catheter placed with 2 liters of output.   Patient was recently admitted for femur fracture last month. He has been having urinary retention and requiring self catheterizing. However seems patient has become more lethargic and confused and most likely not doing his self catheterizations.    Medications: Outpatient medications: Medications Prior to Admission  Medication Sig Dispense Refill Last Dose  . apixaban (ELIQUIS) 2.5 MG TABS tablet Take 2.5 mg by mouth 2 (two) times daily.   06/11/2019 at 0900  . aspirin 81 MG chewable tablet Chew 81 mg by mouth daily.   06/11/2019 at 0900  . Ensure Max Protein (ENSURE MAX PROTEIN) LIQD Take 330 mLs (11 oz total) by mouth 2 (two) times daily between meals. 330 mL 0 06/11/2019 at 0900  . ferrous sulfate 325 (65 FE) MG tablet Take 1 tablet (325 mg total) by mouth 2 (two) times daily with a meal. 60 tablet 3 06/11/2019 at 0900  . furosemide (LASIX) 40 MG tablet Take 40 mg by mouth.   06/11/2019 at 0900  . insulin NPH Human (NOVOLIN N) 100 UNIT/ML injection Inject 0.2 mLs (20 Units total) into the skin every evening. 30 mL 11 06/11/2019 at 1600  . insulin regular (NOVOLIN R) 100 units/mL injection Inject 0.05 mLs (5 Units total) into the skin 3 (three) times daily before meals. 10 mL 11 06/11/2019 at 1630  . losartan (COZAAR) 100 MG tablet Take 100 mg by mouth daily.   06/11/2019 at 0900  . polyethylene glycol (MIRALAX / GLYCOLAX) 17 g packet Take 17 g by mouth daily. 14 each 0 06/11/2019 at 0900  . bisacodyl  (DULCOLAX) 10 MG suppository Place 1 suppository (10 mg total) rectally daily as needed for moderate constipation. 12 suppository 0 unknown at prn  . oxyCODONE (OXY IR/ROXICODONE) 5 MG immediate release tablet Take 1 tablet (5 mg total) by mouth every 4 (four) hours as needed for moderate pain (pain score 4-6). 20 tablet 0 onknown at prn    Current medications: Current Facility-Administered Medications  Medication Dose Route Frequency Provider Last Rate Last Admin  . acetaminophen (TYLENOL) tablet 650 mg  650 mg Oral Q6H PRN Athena Masse, MD       Or  . acetaminophen (TYLENOL) suppository 650 mg  650 mg Rectal Q6H PRN Athena Masse, MD      . apixaban [facility] [place]) tablet 2.5 mg  2.5 mg Oral BID Val Riles, MD   2.5 mg at 06/12/19 1303  . cefTRIAXone (ROCEPHIN) 1 g in sodium chloride 0.9 % 100 mL IVPB  1 g Intravenous Q24H Judd Gaudier V, MD      . Chlorhexidine Gluconate Cloth 2 % PADS 6 each  6 each Topical Daily  Val Riles, MD   6 each at 06/12/19 1422  . insulin aspart (novoLOG) injection 0-5 Units  0-5 Units Subcutaneous QHS Judd Gaudier V, MD      . insulin aspart (novoLOG) injection 0-9 Units  0-9 Units Subcutaneous TID WC Athena Masse, MD   3 Units at 06/12/19 1700  . ondansetron (ZOFRAN) tablet 4 mg  4 mg Oral Q6H PRN Athena Masse, MD       Or  . ondansetron [facility]) injection 4 mg  4 mg Intravenous Q6H PRN Athena Masse, MD      . patiromer [facility]) packet 16.8 g  16.8 g Oral Daily Judd Gaudier V, MD   16.8 g at 06/12/19 1303  . senna-docusate (Senokot-S) tablet 1 tablet  1 tablet Oral QHS PRN Judd Gaudier V, MD      . sodium bicarbonate 150 mEq in dextrose 5% 1000 mL infusion  150 mEq Intravenous Continuous Val Riles, MD 75 mL/hr at 06/12/19 1256 150 mEq at 06/12/19 1256      Allergies: Allergies  Allergen Reactions  . Bee Venom Other (See Comments)  . Ezetimibe Other (See Comments)  . Fenofibrate Micronized Other (See Comments)  . Pravastatin Other  (See Comments)  . Sulfa Antibiotics Other (See Comments)      Past Medical History: Past Medical History:  Diagnosis Date  . Atrial fibrillation ([place])   . CKD (chronic kidney disease), stage IIIa   . HTN (hypertension)   . Type II diabetes mellitus with renal manifestations [facility])      Past Surgical History: Past Surgical History:  Procedure Laterality Date  . APPENDECTOMY    . ORIF FEMUR FRACTURE Left 05/13/2019   Procedure: OPEN REDUCTION INTERNAL FIXATION (ORIF) of closed  left periprosthetic supracondylar femur fracture.;  Surgeon: Corky Mull, MD;  Location: [facility] ORS;  Service: Orthopedics;  Laterality: Left;  . REPLACEMENT TOTAL KNEE Left      Family History: No family history on file.   Social History: Social History   Socioeconomic History  . Marital status: Widowed    Spouse name: Not on file  . Number of children: Not on file  . Years of education: Not on file  . Highest education level: Not on file  Occupational History  . Not on file  Tobacco Use  . Smoking status: Former Research scientist (life sciences)  . Smokeless tobacco: Never Used  Substance and Sexual Activity  . Alcohol use: Never  . Drug use: Not Currently  . Sexual activity: Not on file  Other Topics Concern  . Not on file  Social History Narrative  . Not on file   Social Determinants of Health   Financial Resource Strain:   . Difficulty of Paying Living Expenses: Not on file  Food Insecurity:   . Worried About Charity fundraiser in the Last Year: Not on file  . Ran Out of Food in the Last Year: Not on file  Transportation Needs:   . Lack of Transportation (Medical): Not on file  . Lack of Transportation (Non-Medical): Not on file  Physical Activity:   . Days of Exercise per Week: Not on file  . Minutes of Exercise per Session: Not on file  Stress:   . Feeling of Stress : Not on file  Social Connections:   . Frequency of Communication with Friends and Family: Not on file  . Frequency of Social  Gatherings with Friends and Family: Not on file  . Attends Religious Services: Not on  file  . Active Member of Clubs or Organizations: Not on file  . Attends Archivist Meetings: Not on file  . Marital Status: Not on file  Intimate Partner Violence:   . Fear of Current or Ex-Partner: Not on file  . Emotionally Abused: Not on file  . Physically Abused: Not on file  . Sexually Abused: Not on file     Review of Systems: Review of Systems  Unable to perform ROS: Dementia    Vital Signs: Blood pressure (!) 102/51, pulse 61, temperature (!) 97.4 F (36.3 C), temperature source Oral, resp. rate 18, height 5\' 10"  (1.778 m), weight 92.1 kg, SpO2 100 %.  Weight trends: Filed Weights   06/11/19 1831 06/11/19 2324  Weight: 81.6 kg 92.1 kg    Physical Exam: General: NAD,   Head: Normocephalic, atraumatic. Moist oral mucosal membranes  Eyes: Anicteric, PERRL  Neck: Supple, trachea midline  Lungs:  Clear to auscultation  Heart: Regular rate and rhythm  Abdomen:  Soft, nontender,   Extremities:  no peripheral edema.  Neurologic: +tremor, alert to self only  Skin: No lesions         Lab results: Basic Metabolic Panel: Recent Labs  Lab 06/11/19 1827 06/12/19 0003 06/12/19 0351  NA 137 140 141  K 5.8* 5.5* 5.3*  CL 109 115* 115*  CO2 16* 13* 14*  GLUCOSE 312* 235* 216*  BUN 161* 167* 159*  CREATININE 7.09* 6.90* 6.52*  CALCIUM 8.9 8.7* 8.7*    Liver Function Tests: Recent Labs  Lab 06/11/19 1827 06/12/19 0351  AST 48* 28  ALT 58* 46*  ALKPHOS 200* 168*  BILITOT 0.9 0.9  PROT 7.0 6.1*  ALBUMIN 2.6* 2.3*   No results for input(s): LIPASE, AMYLASE in the last 168 hours. No results for input(s): AMMONIA in the last 168 hours.  CBC: Recent Labs  Lab 06/11/19 1827 06/12/19 0351  WBC 10.0 8.8  HGB 12.1* 10.8*  HCT 37.1* 33.7*  MCV 103.1* 104.3*  PLT 208 187    Cardiac Enzymes: No results for input(s): CKTOTAL, CKMB, CKMBINDEX, TROPONINI in the  last 168 hours.  BNP: Invalid input(s): POCBNP  CBG: Recent Labs  Lab 06/12/19 0742 06/12/19 1257 06/12/19 1641  GLUCAP 201* 222* 246*    Microbiology: Results for orders placed or performed during the hospital encounter of 06/11/19  Respiratory Panel by RT PCR (Flu A&B, Covid) - Nasopharyngeal Swab     Status: None   Collection Time: 06/11/19  6:27 PM   Specimen: Nasopharyngeal Swab  Result Value Ref Range Status   SARS Coronavirus 2 by RT PCR NEGATIVE NEGATIVE Final    Comment: (NOTE) SARS-CoV-2 target nucleic acids are NOT DETECTED. The SARS-CoV-2 RNA is generally detectable in upper respiratoy specimens during the acute phase of infection. The lowest concentration of SARS-CoV-2 viral copies this assay can detect is 131 copies/mL. A negative result does not preclude SARS-Cov-2 infection and should not be used as the sole basis for treatment or other patient management decisions. A negative result may occur with  improper specimen collection/handling, submission of specimen other than nasopharyngeal swab, presence of viral mutation(s) within the areas targeted by this assay, and inadequate number of viral copies (<131 copies/mL). A negative result must be combined with clinical observations, patient history, and epidemiological information. The expected result is Negative. Fact Sheet for Patients:  PinkCheek.be Fact Sheet for Healthcare Providers:  GravelBags.it This test is not yet ap proved or cleared by the [place] and  has been authorized for detection and/or diagnosis of SARS-CoV-2 by FDA under an Emergency Use Authorization (EUA). This EUA will remain  in effect (meaning this test can be used) for the duration of the COVID-19 declaration under Section 564(b)(1) of the Act, 21 U.S.C. section 360bbb-3(b)(1), unless the authorization is terminated or revoked sooner.    Influenza A by PCR NEGATIVE  NEGATIVE Final   Influenza B by PCR NEGATIVE NEGATIVE Final    Comment: (NOTE) The Xpert Xpress SARS-CoV-2/FLU/RSV assay is intended as an aid in  the diagnosis of influenza from Nasopharyngeal swab specimens and  should not be used as a sole basis for treatment. Nasal washings and  aspirates are unacceptable for Xpert Xpress SARS-CoV-2/FLU/RSV  testing. Fact Sheet for Patients: PinkCheek.be Fact Sheet for Healthcare Providers: GravelBags.it This test is not yet approved or cleared by the [place] FDA and  has been authorized for detection and/or diagnosis of SARS-CoV-2 by  FDA under an Emergency Use Authorization (EUA). This EUA will remain  in effect (meaning this test can be used) for the duration of the  Covid-19 declaration under Section 564(b)(1) of the Act, 21  U.S.C. section 360bbb-3(b)(1), unless the authorization is  terminated or revoked. Performed at [facility], [address] [place], [place] [address]     Coagulation Studies: No results for input(s): LABPROT, INR in the last 72 hours.  Urinalysis: Recent Labs    06/11/19 2202  COLORURINE YELLOW*  LABSPEC 1.011  PHURINE 5.0  GLUCOSEU NEGATIVE  HGBUR MODERATE*  BILIRUBINUR NEGATIVE  KETONESUR NEGATIVE  PROTEINUR 100*  NITRITE NEGATIVE  LEUKOCYTESUR LARGE*      Imaging: US RENAL  Result Date: 06/12/2019 CLINICAL DATA:  Acute renal failure. EXAM: RENAL / URINARY TRACT ULTRASOUND COMPLETE COMPARISON:  None. FINDINGS: Right Kidney: Renal measurements: 11.6 x 5.3 x 6.4 cm = volume: 199 mL. At least 3 simple cysts are noted, the largest measuring 3.3 cm in lower pole. Echogenicity within normal limits. No mass or hydronephrosis visualized. Left Kidney: Renal measurements: 13.9 x 5.1 x 6.0 cm = volume: 223 mL. Several cysts are noted, the largest measuring 10.5 cm. Echogenicity within normal limits. No hydronephrosis visualized. Bladder:  Decompressed secondary to Foley catheter. Other: None. IMPRESSION: Bilateral simple renal cysts are noted, the largest measuring 10.5 cm on the left. No other renal abnormality is noted. Electronically Signed   By: Marijo Conception M.D.   On: 06/12/2019 12:29   DG Chest Port 1 View  Result Date: 06/11/2019 CLINICAL DATA:  Weakness EXAM: PORTABLE CHEST 1 VIEW COMPARISON:  05/12/2019 FINDINGS: Cardiac shadow is enlarged but stable. Aortic calcifications are again seen. Calcified granuloma is again noted in the right mid lung. Postsurgical changes in the right lung are seen as well with basilar scarring. Calcified hilar and mediastinal lymph nodes are seen. No bony abnormality is noted. IMPRESSION: Changes consistent with prior granulomatous disease. No acute bony abnormality is seen. Postsurgical change with chronic scarring in the right base. Electronically Signed   By: Inez Catalina M.D.   On: 06/11/2019 22:46      Assessment & Plan: Edward Strong is a 86 y.o. white male with diabetes mellitus type II insulin dependent, hypertension, atrial fibrillation on apixaban, who was admitted to [facility] on 06/11/2019 for AKI (acute kidney injury) ([place]) [N17.9] Acute renal failure, unspecified acute renal failure type ([place]) [N17.9] AMS (altered mental status) [R41.82]  1. Acute renal failure with hyperkalemia and metabolic acidosis on chronic kidney disease stage  IIIB with proteinuria: baseline creatinine of 1.7. GFR of 38 on 11/20/2018.  Chronic kidney disease secondary to diabetic nephropathy.  Acute renal failure secondary to prerenal azotemia and obstructive uropathy - 2 liters of urine after foley placement. However minimal improvement in renal function Renal ultrasound with no obstruction. No IV contrast - IV fluids: sodium bicarbonate 18mL/hr - [place]  2. Urinary tract infection: 2/3 urine cultures are pending.  - ceftriaxone.   3. Hypotension:  - holding furosemide and losartan  LOS: 1 Alyene Predmore 2/4/20216:08 PM

## 2019-06-13 ENCOUNTER — Inpatient Hospital Stay: Payer: Medicare Other

## 2019-06-13 ENCOUNTER — Other Ambulatory Visit: Payer: Self-pay

## 2019-06-13 LAB — CBC
HCT: 30.6 % — ABNORMAL LOW (ref 39.0–52.0)
Hemoglobin: 10.2 g/dL — ABNORMAL LOW (ref 13.0–17.0)
MCH: 34.1 pg — ABNORMAL HIGH (ref 26.0–34.0)
MCHC: 33.3 g/dL (ref 30.0–36.0)
MCV: 102.3 fL — ABNORMAL HIGH (ref 80.0–100.0)
Platelets: 184 10*3/uL (ref 150–400)
RBC: 2.99 MIL/uL — ABNORMAL LOW (ref 4.22–5.81)
RDW: 14.4 % (ref 11.5–15.5)
WBC: 10 10*3/uL (ref 4.0–10.5)
nRBC: 0 % (ref 0.0–0.2)

## 2019-06-13 LAB — BASIC METABOLIC PANEL
Anion gap: 8 (ref 5–15)
BUN: 142 mg/dL — ABNORMAL HIGH (ref 8–23)
CO2: 20 mmol/L — ABNORMAL LOW (ref 22–32)
Calcium: 8.6 mg/dL — ABNORMAL LOW (ref 8.9–10.3)
Chloride: 116 mmol/L — ABNORMAL HIGH (ref 98–111)
Creatinine, Ser: 4.76 mg/dL — ABNORMAL HIGH (ref 0.61–1.24)
GFR calc Af Amer: 12 mL/min — ABNORMAL LOW (ref >60)
GFR calc non Af Amer: 10 mL/min — ABNORMAL LOW (ref >60)
Glucose, Bld: 251 mg/dL — ABNORMAL HIGH (ref 70–99)
Potassium: 4.3 mmol/L (ref 3.5–5.1)
Sodium: 144 mmol/L (ref 135–145)

## 2019-06-13 LAB — GLUCOSE, CAPILLARY
Glucose-Capillary: 254 mg/dL — ABNORMAL HIGH (ref 70–99)
Glucose-Capillary: 334 mg/dL — ABNORMAL HIGH (ref 70–99)
Glucose-Capillary: 244 mg/dL — ABNORMAL HIGH (ref 70–99)
Glucose-Capillary: 280 mg/dL — ABNORMAL HIGH (ref 70–99)

## 2019-06-13 LAB — PHOSPHORUS: Phosphorus: 4.3 mg/dL (ref 2.5–4.6)

## 2019-06-13 LAB — MAGNESIUM: Magnesium: 2.4 mg/dL (ref 1.7–2.4)

## 2019-06-13 NOTE — Progress Notes (Signed)
[facility] Progress Note  Patient: Edward Strong    NGE:[id number]  DOA: 06/11/2019     Date of Service: the patient was seen and examined on 06/13/2019  Chief Complaint  Patient presents with  . Abnormal Lab   Brief hospital course: Chief Complaint: altered mental status, abnormal labs  [name] is a 86 y.o. male with medical history significant for insulin-dependent type 2 diabetes, CKD 3A, chronic A. fib on Eliquis, CAD, systolic heart failure, documented history of Hodgkin's lymphoma , recently underwent ORIF on 05/13/2019, postoperative course complicated by postoperative bleeding requiring 1 unit PRBC who also has a history severe urinary retention secondary to BPH who has been self-catheterizing four times a day until his recent discharge to rehab, who was sent to the emergency room by the nursing home where he was receiving rehab for abnormal labs.  Patient was reportedly lethargic and sleeping a lot and blood work revealed abnormal kidney function.  Most of the history was taken from his son, Edward Strong over the phone, who said that at baseline his father is independent and is able to self catheterize without problems.  He stated that his father had been doing well without complaints until he went to the nursing home.  He said he had been trying to communicate with the nursing home about the need for self-catheterization but he was not sure whether it was being done. ED Course: On arrival to the emergency room he was hypotensive at 90/50.  He was afebrile at 98.6, heart rate 85 respirations 18 with O2 sat 98% on room air.  Blood work notable for creatinine of 7.09 above his baseline of 1.8 with bicarb of 16 and potassium of 5.8.  Blood sugar was 312.  He was also noted to have elevated liver enzymes with AST 48, ALT 58 and alk phos 200.  Covid and flu test were negative, EKG showed A. Fib. Following admission, Foley catheter placement got a return of 2 L urine, turbid appearing.   Urinalysis showed positive leukocyte esterase  Currently further plan is just to monitor renal functions and follow nephrologist for further management  Assessment and Plan:  # Acute kidney injury superimposed on chronic kidney disease IIIa  Metabolic acidosis Hyperkalemia Suspect mixed, obstructive related to acute on chronic urinary retention in patient dependent on self-catheterization as well as possibly due to dehydration  BUN/creatinine was 161/7.9 with baseline creatinine of 1.8 IV hydration with sodium bicarbonate solution to treat metabolic acidosis V[place]assa 16.8  orally daily until hyperkalemia Renal ultrasound: Bilateral simple renal cysts are noted, the largest measuring 10.5 cm on the left. No other renal abnormality is noted. 06/13/2019 creatinine 4.76 today, gradually improving Nephrology consulted Dr. Juleen China is following Patient is still acidotic on bicarbonate IV infusion so bicarbonate IV 50 mEq x 2 amp given on 06/13/19  Acute on chronic urinary retention BPH with LUTS -Urine retention of 2 L on placement of Foley catheter -At baseline patient self catheterizes 4 times daily for several years -Patient follows with a urologist in [place] and had a recent procedure that did not help with his chronic urinary retention -Discussed with Dr Erlene Quan, urologist via secure chat: "I really don't think there's anything for [place] to add in his care are the moment. Reviewed chart. If cr fails to improve with foley or if there are any other specific concerns, glad to see (like bleeding, catheter problems, etc). If the concern is just that he has chronic retention, not something that  we can change or address during this admission anyway and recommend outpt f/u." -Outpatient referral back to urology as per recommendation   Urinary tract infection Rocephin 1 g daily urine cultures Enterobacter species, sensitivity pending Follow complete report.  Hyperglycemia due to type 2  diabetes mellitus ([place]) Insulin-dependent type 2 diabetes Blood glucose was 312 Sliding scale insulin coverage for now. Hold home insulin regimen    Hypotension Suspect related to dehydration and metabolic derangements.  Follow lactic acid to evaluate for sepsis though Not quite yet meeting sepsis criteria, has no fever or tachycardia or tachypnea has a normal white cell count. lactic acid 2.0>1.6 trended down Close hemodynamic monitoring IV hydration with monitoring for overload in view of history of systolic heart failure Hold home antihypertensives Resume as appropriate but will avoid ACE/ARB Continue to monitor    Acute metabolic encephalopathy Secondary to dehydration and metabolic derangements related to AKI Treat as above Fall and aspiration precaution    Atrial fibrillation ([place]) Pharmacy to dose Eliquis 2.5 mg p.o. twice daily started on 06/12/2019 Patient is on chronic Eliquis Currently rate controlled.  Not on rate control agent    Chronic systolic CHF (congestive heart failure) (HCC) Hold Lasix and losartan.  Not currently on beta-blockers for uncertain reason Monitor for fluid overload in view of IV hydration    S/P ORIF (open reduction internal fixation) fracture Patient currently in rehab No acute problems   Body mass index is 29.13 kg/m.    Interventions:    Pressure Injury 06/11/19 Sacrum Mid Unstageable - Full thickness tissue loss in which the base of the injury is covered by slough (yellow, tan, gray, green or brown) and/or eschar (tan, brown or black) in the wound bed. (Active)  06/11/19   Location: Sacrum  Location Orientation: Mid  Staging: Unstageable - Full thickness tissue loss in which the base of the injury is covered by slough (yellow, tan, gray, green or brown) and/or eschar (tan, brown or black) in the wound bed.  Wound Description (Comments):   Present on Admission: Yes     Diet: Heart healthy and diabetic diet DVT Prophylaxis:  Therapeutic Anticoagulation with Eliquis   Advance goals of care discussion: Full code  Family Communication: No one from family was present at bedside, at the time of interview.  The pt provided permission to discuss medical plan with the family. Opportunity was given to ask question and all questions were answered satisfactorily.   Disposition:  Pt is from Home, admitted with renal failure, still has renal failure and metabolic acidosis, which precludes a safe discharge. Discharge to home with home health possibly, when renal functions improve and cleared by nephrologist. Patient is to be seen by PT and OT before discharge  Subjective: Patient was seen and examined at bedside in the morning, no overnight issues.  Patient seems to be confused, has dementia AAO x1-2 Patient does not even remember that I am seeing him every day.  Patient was lying comfortably in the bed, denies any complaints no active issues.  Physical Exam: General:  alert oriented to place and person.  Appear in mild distress, affect seems confused Eyes: PERRLA ENT: Oral Mucosa Clear, moist  Neck: no JVD,  Cardiovascular: S1 and S2 Present, no Murmur,  Respiratory: good respiratory effort, Bilateral Air entry equal and Decreased, no Crackles, no wheezes Abdomen: Bowel Sound present, Soft and no tenderness,  Skin: no rashes Extremities: no Pedal edema, no calf tenderness Neurologic: without any new focal findings Gait not checked  due to patient safety concerns  Vitals:   06/12/19 1422 06/12/19 1948 06/13/19 0346 06/13/19 1423  BP: (!) 102/51 (!) 94/55 (!) 124/57 118/60  Pulse: 61 68 79 76  Resp: _0 Temp: (!) 97.4 F (36.3 C) 98.6 F (37 C) 98.3 F (36.8 C) (!) 97.5 F (36.4 C)  TempSrc: Oral   Oral  SpO2: 100% 97% 100% 99%  Weight:      Height:        Intake/Output Summary (Last 24 hours) at 06/13/2019 1622 Last data filed at 06/13/2019 1427 Gross per 24 hour  Intake 2048.3 ml  Output 3050 ml    Net -1001.7 ml   Filed Weights   06/11/19 1831 06/11/19 2324  Weight: 81.6 kg 92.1 kg    Data Reviewed: I have personally reviewed and interpreted daily labs, tele strips, imagings as discussed above. I reviewed all nursing notes, pharmacy notes, vitals, pertinent old records I have discussed plan of care as described above with RN and patient/family.  CBC: Recent Labs  Lab 06/11/19 1827 06/12/19 0351 06/13/19 0408  WBC 10.0 8.8 10.0  HGB 12.1* 10.8* 10.2*  HCT 37.1* 33.7* 30.6*  MCV 103.1* 104.3* 102.3*  PLT 208 187 263   Basic Metabolic Panel: Recent Labs  Lab 06/11/19 1827 06/12/19 0003 06/12/19 0351 06/13/19 0408  NA 137 140 141 144  K 5.8* 5.5* 5.3* 4.3  CL 109 115* 115* 116*  CO2 16* 13* 14* 20*  GLUCOSE 312* 235* 216* 251*  BUN 161* 167* 159* 142*  CREATININE 7.09* 6.90* 6.52* 4.76*  CALCIUM 8.9 8.7* 8.7* 8.6*  MG  --   --   --  2.4  PHOS  --   --   --  4.3    Studies: DG FEMUR MIN 2 VIEWS LEFT  Result Date: 06/13/2019 CLINICAL DATA:  Left femur ORIF 05/13/2019 EXAM: LEFT FEMUR 2 VIEWS COMPARISON:  None. FINDINGS: Comminuted distal femoral diametaphyseal fracture just above the distal femoral arthroplasty component transfixed with a lateral sideplate and multiple interlocking screws and a single cerclage wire. No hardware failure or complication. Fracture is in near anatomic alignment. Generalized osteopenia. No other fracture or dislocation. Prior total knee arthroplasty in satisfactory position. Postsurgical changes in the surrounding soft tissues. Peripheral vascular atherosclerotic disease. IMPRESSION: Interval distal femoral diametaphyseal ORIF. Electronically Signed   By: Kathreen Devoid   On: 06/13/2019 09:49    Scheduled Meds: . apixaban  2.5 mg Oral BID  . Chlorhexidine Gluconate Cloth  6 each Topical Daily  . insulin aspart  0-5 Units Subcutaneous QHS  . insulin aspart  0-9 Units Subcutaneous TID WC  . patiromer  16.8 g Oral Daily   Continuous  Infusions: . cefTRIAXone (ROCEPHIN)  IV Stopped (06/12/19 2157)  . sodium bicarbonate 150 mEq in dextrose 5% 1000 mL 150 mEq (06/13/19 1529)   PRN Meds: acetaminophen **OR** acetaminophen, ondansetron **OR** ondansetron (ZOFRAN) IV, senna-docusate  Time spent: 35 minutes  Author: Val Riles. MD [facility] 06/13/2019 4:22 PM  To reach On-call, see care teams to locate the attending and reach out to them via [email or web address]. If 7PM-7AM, please contact night-coverage If you still have difficulty reaching the attending provider, please page the [facility] (Director on Call) for [facility] on amion for assistance.

## 2019-06-13 NOTE — Progress Notes (Signed)
[facility]  ROUNDING NOTE   Subjective:   Son at bedside.   UOP 1626mL  Sodium bicarb gtt  Creatinine 4.76 (6.52( BUN 142 (159) CO2 20 (14) K 4.3 (5.3)  Objective:  Vital signs in last 24 hours:  Temp:  [97.4 F (36.3 C)-98.6 F (37 C)] 98.3 F (36.8 C) (02/05 0346) Pulse Rate:  [61-79] 79 (02/05 0346) Resp:  [18-20] 18 (02/05 0346) BP: (94-124)/(51-57) 124/57 (02/05 0346) SpO2:  [97 %-100 %] 100 % (02/05 0346)  Weight change:  Filed Weights   06/11/19 1831 06/11/19 2324  Weight: 81.6 kg 92.1 kg    Intake/Output: I/O last 3 completed shifts: In: 2276.3 [P.O.:480; I.V.:1677.3; IV Piggyback:119] Out: [id number]   Intake/Output this shift:  Total I/O In: 365.3 [I.V.:365.3] Out: 1600 [Urine:1600]  Physical Exam: General: NAD, laying in bed  Head: Normocephalic, atraumatic. Moist oral mucosal membranes  Eyes: Anicteric, PERRL  Neck: Supple, trachea midline  Lungs:  Clear to auscultation  Heart: Regular rate and rhythm  Abdomen:  Soft, nontender,   Extremities: no peripheral edema.  Neurologic: Nonfocal, moving all four extremities  Skin: No lesions  GU: Foley with urine    Basic Metabolic Panel: Recent Labs  Lab 06/11/19 1827 06/11/19 1827 06/12/19 0003 06/12/19 0351 06/13/19 0408  NA 137  --  140 141 144  K 5.8*  --  5.5* 5.3* 4.3  CL 109  --  115* 115* 116*  CO2 16*  --  13* 14* 20*  GLUCOSE 312*  --  235* 216* 251*  BUN 161*  --  167* 159* 142*  CREATININE 7.09*  --  6.90* 6.52* 4.76*  CALCIUM 8.9   < > 8.7* 8.7* 8.6*  MG  --   --   --   --  2.4  PHOS  --   --   --   --  4.3   < > = values in this interval not displayed.    Liver Function Tests: Recent Labs  Lab 06/11/19 1827 06/12/19 0351  AST 48* 28  ALT 58* 46*  ALKPHOS 200* 168*  BILITOT 0.9 0.9  PROT 7.0 6.1*  ALBUMIN 2.6* 2.3*   No results for input(s): LIPASE, AMYLASE in the last 168 hours. No results for input(s): AMMONIA in the last 168  hours.  CBC: Recent Labs  Lab 06/11/19 1827 06/12/19 0351 06/13/19 0408  WBC 10.0 8.8 10.0  HGB 12.1* 10.8* 10.2*  HCT 37.1* 33.7* 30.6*  MCV 103.1* 104.3* 102.3*  PLT 208 187 184    Cardiac Enzymes: No results for input(s): CKTOTAL, CKMB, CKMBINDEX, TROPONINI in the last 168 hours.  BNP: Invalid input(s): POCBNP  CBG: Recent Labs  Lab 06/12/19 0742 06/12/19 1257 06/12/19 1641 06/12/19 2108 06/13/19 0752  GLUCAP 201* 222* 246* 278* 254*    Microbiology: Results for orders placed or performed during the hospital encounter of 06/11/19  Respiratory Panel by RT PCR (Flu A&B, Covid) - Nasopharyngeal Swab     Status: None   Collection Time: 06/11/19  6:27 PM   Specimen: Nasopharyngeal Swab  Result Value Ref Range Status   SARS Coronavirus 2 by RT PCR NEGATIVE NEGATIVE Final    Comment: (NOTE) SARS-CoV-2 target nucleic acids are NOT DETECTED. The SARS-CoV-2 RNA is generally detectable in upper respiratoy specimens during the acute phase of infection. The lowest concentration of SARS-CoV-2 viral copies this assay can detect is 131 copies/mL. A negative result does not preclude SARS-Cov-2 infection and should not be used as  the sole basis for treatment or other patient management decisions. A negative result may occur with  improper specimen collection/handling, submission of specimen other than nasopharyngeal swab, presence of viral mutation(s) within the areas targeted by this assay, and inadequate number of viral copies (<131 copies/mL). A negative result must be combined with clinical observations, patient history, and epidemiological information. The expected result is Negative. Fact Sheet for Patients:  PinkCheek.be Fact Sheet for Healthcare Providers:  GravelBags.it This test is not yet ap proved or cleared by the [place] FDA and  has been authorized for detection and/or diagnosis of SARS-CoV-2  by FDA under an Emergency Use Authorization (EUA). This EUA will remain  in effect (meaning this test can be used) for the duration of the COVID-19 declaration under Section 564(b)(1) of the Act, 21 U.S.C. section 360bbb-3(b)(1), unless the authorization is terminated or revoked sooner.    Influenza A by PCR NEGATIVE NEGATIVE Final   Influenza B by PCR NEGATIVE NEGATIVE Final    Comment: (NOTE) The Xpert Xpress SARS-CoV-2/FLU/RSV assay is intended as an aid in  the diagnosis of influenza from Nasopharyngeal swab specimens and  should not be used as a sole basis for treatment. Nasal washings and  aspirates are unacceptable for Xpert Xpress SARS-CoV-2/FLU/RSV  testing. Fact Sheet for Patients: PinkCheek.be Fact Sheet for Healthcare Providers: GravelBags.it This test is not yet approved or cleared by the [place] FDA and  has been authorized for detection and/or diagnosis of SARS-CoV-2 by  FDA under an Emergency Use Authorization (EUA). This EUA will remain  in effect (meaning this test can be used) for the duration of the  Covid-19 declaration under Section 564(b)(1) of the Act, 21  U.S.C. section 360bbb-3(b)(1), unless the authorization is  terminated or revoked. Performed at [facility], [address], [place], [place] [address]   Urine Culture     Status: Abnormal (Preliminary result)   Collection Time: 06/11/19 10:02 PM   Specimen: Urine, Random  Result Value Ref Range Status   Specimen Description   Final    URINE, RANDOM Performed at [facility], [address], [place], [place] [address]    Special Requests   Final    NONE Performed at [facility], [address] [place], [place] [address]    Culture (A)  Final    70,000 COLONIES/mL ENTEROBACTER SPECIES SUSCEPTIBILITIES TO FOLLOW Performed at [facility], [place] [address] [place], [place] [address]    Report Status  PENDING  Incomplete    Coagulation Studies: No results for input(s): LABPROT, INR in the last 72 hours.  Urinalysis: Recent Labs    06/11/19 2202  COLORURINE YELLOW*  LABSPEC 1.011  PHURINE 5.0  GLUCOSEU NEGATIVE  HGBUR MODERATE*  BILIRUBINUR NEGATIVE  KETONESUR NEGATIVE  PROTEINUR 100*  NITRITE NEGATIVE  LEUKOCYTESUR LARGE*      Imaging: US RENAL  Result Date: 06/12/2019 CLINICAL DATA:  Acute renal failure. EXAM: RENAL / URINARY TRACT ULTRASOUND COMPLETE COMPARISON:  None. FINDINGS: Right Kidney: Renal measurements: 11.6 x 5.3 x 6.4 cm = volume: 199 mL. At least 3 simple cysts are noted, the largest measuring 3.3 cm in lower pole. Echogenicity within normal limits. No mass or hydronephrosis visualized. Left Kidney: Renal measurements: 13.9 x 5.1 x 6.0 cm = volume: 223 mL. Several cysts are noted, the largest measuring 10.5 cm. Echogenicity within normal limits. No hydronephrosis visualized. Bladder: Decompressed secondary to Foley catheter. Other: None. IMPRESSION: Bilateral simple renal cysts are noted, the  largest measuring 10.5 cm on the left. No other renal abnormality is noted. Electronically Signed   By: Marijo Conception M.D.   On: 06/12/2019 12:29   DG Chest Port 1 View  Result Date: 06/11/2019 CLINICAL DATA:  Weakness EXAM: PORTABLE CHEST 1 VIEW COMPARISON:  05/12/2019 FINDINGS: Cardiac shadow is enlarged but stable. Aortic calcifications are again seen. Calcified granuloma is again noted in the right mid lung. Postsurgical changes in the right lung are seen as well with basilar scarring. Calcified hilar and mediastinal lymph nodes are seen. No bony abnormality is noted. IMPRESSION: Changes consistent with prior granulomatous disease. No acute bony abnormality is seen. Postsurgical change with chronic scarring in the right base. Electronically Signed   By: Inez Catalina M.D.   On: 06/11/2019 22:46   DG FEMUR MIN 2 VIEWS LEFT  Result Date: 06/13/2019 CLINICAL DATA:  Left femur  ORIF 05/13/2019 EXAM: LEFT FEMUR 2 VIEWS COMPARISON:  None. FINDINGS: Comminuted distal femoral diametaphyseal fracture just above the distal femoral arthroplasty component transfixed with a lateral sideplate and multiple interlocking screws and a single cerclage wire. No hardware failure or complication. Fracture is in near anatomic alignment. Generalized osteopenia. No other fracture or dislocation. Prior total knee arthroplasty in satisfactory position. Postsurgical changes in the surrounding soft tissues. Peripheral vascular atherosclerotic disease. IMPRESSION: Interval distal femoral diametaphyseal ORIF. Electronically Signed   By: Kathreen Devoid   On: 06/13/2019 09:49     Medications:   . cefTRIAXone (ROCEPHIN)  IV Stopped (06/12/19 2157)  . sodium bicarbonate 150 mEq in dextrose 5% 1000 mL 75 mL/hr at 06/13/19 1000   . apixaban  2.5 mg Oral BID  . Chlorhexidine Gluconate Cloth  6 each Topical Daily  . insulin aspart  0-5 Units Subcutaneous QHS  . insulin aspart  0-9 Units Subcutaneous TID WC  . patiromer  16.8 g Oral Daily   acetaminophen **OR** acetaminophen, ondansetron **OR** ondansetron (ZOFRAN) IV, senna-docusate  Assessment/ Plan:  [name] is a 86 y.o. white male with diabetes mellitus type II insulin dependent, hypertension, atrial fibrillation on apixaban, who was admitted to [facility] on 06/11/2019 for acute renal failure.   1. Acute renal failure with hyperkalemia and metabolic acidosis on chronic kidney disease stage IIIB with proteinuria: baseline creatinine of 1.7. GFR of 38 on 11/20/2018.  Chronic kidney disease secondary to diabetic nephropathy.  Acute renal failure secondary to prerenal azotemia and obstructive uropathy - 2 liters of urine after foley placement.  Renal ultrasound with no obstruction. No IV contrast No indication for dialysis - IV fluids: sodium bicarbonate 12mL/hr - patiromer  2. Urinary tract infection: 2/3 urine cultures enterobacter -  ceftriaxone.   3. Hypotension:  - holding furosemide and losartan   LOS: 2 Tishawn Friedhoff 2/5/202112:22 PM

## 2019-06-13 NOTE — Evaluation (Signed)
Physical Therapy Evaluation Patient Details Name: Edward Strong MRN: [id number] DOB: 1930/02/16 Today's Date: 06/13/2019   History of Present Illness  Pt admitted for AKI secondary to CKD. HIstory of L distal femur fracture s/p ORIF on 05/23/19 currently NWB with hinged brace. Other history includes DM, Afib, CAD, and hodgkins lymphoma. Currently from SNF at this time, however family is hopeful to dc to other SNF.  Clinical Impression  Pt is a pleasant 86 year old male who was admitted for AKI secondary to CKD. Recent admission for L distal femur fracture, requiring SNF stay; now admitted for current complaints. Due to confusion, unable to perform OOB mobility, needed +2 for scooting up towards [place]. Pt demonstrates deficits with cognition/strength. Unable to lift B LE off bed. Per son, unsure if he was participating/receiving PT at recent SNF. They are hoping for discharge to a different SNF with consistent PT. Would benefit from skilled PT to address above deficits and promote optimal return to PLOF; recommend transition to STR upon discharge from acute hospitalization.     Follow Up Recommendations SNF    Equipment Recommendations  None recommended by PT    Recommendations for Other Services       Precautions / Restrictions Precautions Precautions: Fall Required Braces or Orthoses: Knee Immobilizer - Left Knee Immobilizer - Left: On at all times Restrictions Weight Bearing Restrictions: Yes LLE Weight Bearing: Non weight bearing      Mobility  Bed Mobility Overal bed mobility: Needs Assistance             General bed mobility comments: unable to follow commands for OOB mobility. Needs +2 for sliding up towards HOB. Pt able to participate by reaching for headboard railing with B UE and sliding in bed  Transfers                 General transfer comment: unable  Ambulation/Gait                Stairs            Wheelchair Mobility    Modified Rankin  (Stroke Patients Only)       Balance Overall balance assessment: History of Falls                                           Pertinent Vitals/Pain Pain Assessment: Faces Faces Pain Scale: Hurts little more Pain Location: unable to vocalize due to confusion Pain Descriptors / Indicators: Aching Pain Intervention(s): Limited activity within patient's tolerance;Repositioned    Home Living Family/patient expects to be discharged to:: Skilled nursing facility                      Prior Function           Comments: was previously independent, living alone, however has spent the last month in SNF and hasn't been OOB per son. Unsure if he was receiving rehab services     Hand Dominance        Extremity/Trunk Assessment   Upper Extremity Assessment Upper Extremity Assessment: Generalized weakness;Difficult to assess due to impaired cognition(B UE grossly 4/5)    Lower Extremity Assessment Lower Extremity Assessment: Generalized weakness;Difficult to assess due to impaired cognition(B LE grossly 2/5)       Communication   Communication: No difficulties  Cognition Arousal/Alertness: Lethargic Behavior During Therapy: WFL for tasks  assessed/performed Overall Cognitive Status: History of cognitive impairments - at baseline                                        General Comments      Exercises Other Exercises Other Exercises: attempted ther-ex including B SLRs and knee flexion of R LE. Spontaneous movement of R LE however unable to follow for participate on command.   Assessment/Plan    PT Assessment Patient needs continued PT services  PT Problem List Decreased strength;Decreased activity tolerance;Decreased balance;Decreased mobility;Decreased cognition;Decreased safety awareness       PT Treatment Interventions Gait training;Therapeutic exercise;Balance training;DME instruction    PT Goals (Current goals can be found in  the Care Plan section)  Acute Rehab PT Goals Patient Stated Goal: to get stronger PT Goal Formulation: With family Time For Goal Achievement: 06/27/19 Potential to Achieve Goals: Good    Frequency Min 2X/week   Barriers to discharge        Co-evaluation               AM-PAC PT "6 Clicks" Mobility  Outcome Measure Help needed turning from your back to your side while in a flat bed without using bedrails?: Total Help needed moving from lying on your back to sitting on the side of a flat bed without using bedrails?: Total Help needed moving to and from a bed to a chair (including a wheelchair)?: Total Help needed standing up from a chair using your arms (e.g., wheelchair or bedside chair)?: Total Help needed to walk in hospital room?: Total Help needed climbing 3-5 steps with a railing? : Total 6 Click Score: 6    End of Session   Activity Tolerance: Patient limited by lethargy Patient left: in bed;with bed alarm set Nurse Communication: Mobility status PT Visit Diagnosis: Muscle weakness (generalized) (M62.81);Difficulty in walking, not elsewhere classified (R26.2)    Time: [id number] PT Time Calculation (min) (ACUTE ONLY): 13 min   Charges:   PT Evaluation $PT Eval Moderate Complexity: [address] PT, DPT [phone]   Nataliyah Packham 06/13/2019, 5:00 PM

## 2019-06-13 NOTE — TOC Initial Note (Addendum)
Transition of Care [facility]) - Initial/Assessment Note    Patient Details  Name: Edward Strong MRN: [id number] Date of Birth: 1929/12/11  [facility] CM/SW Contact:    Candie Chroman, LCSW Phone Number: 06/13/2019, 12:13 PM  Clinical Narrative: Patient not fully oriented. Son, Edward Strong, at bedside. CSW introduced role and explained that discharge planning would be discussed. Patient's son confirmed he was admitted from [facility] where he was getting rehab but will not return due to poor treatment. CSW asked if he is familiar with any other local facilities but he said he lives 4 hours away so he is not. He asked that CSW call his brother Edward Strong to see if he has a preference because he lives locally. CSW called and left Edward Strong a voicemail. No further concerns. CSW encouraged patient's son to contact CSW as needed. CSW will continue to follow patient and his sons for support and facilitate discharge to SNF once medically stable.   12:41 pm: Received call back from son, Edward Strong. He has a friend that works at [employer] and [place] in [place] and would like to see if they would be able to accept him.     2:17 pm: Left voicemail for [facility] admissions coordinator.      2:41 pm: Received call back from admissions coordinator. Faxed clinicals we have so far for them to review. She said they will likely have some rehab beds next week.         Expected Discharge Plan: [place] Barriers to Discharge: Continued Medical Work up   Patient Goals and CMS Choice        Expected Discharge Plan and Services Expected Discharge Plan: [place] Choice: [place] Living arrangements for the past 2 months: [place], [place]                                      Prior Living Arrangements/Services Living arrangements for the past 2 months: [place], [place] Lives with:: Self Patient language and need for interpreter reviewed:: Yes Do you feel safe going back to the place where you live?: Yes      Need for Family Participation in Patient Care: Yes (Comment) Care giver support system in place?: Yes (comment)   Criminal Activity/Legal Involvement Pertinent to Current Situation/Hospitalization: No - Comment as needed  Activities of Daily Living      Permission Sought/Granted Permission sought to share information with : Facility Sport and exercise psychologist, Family Supports Permission granted to share information with : Yes, Verbal Permission Granted  Share Information with NAME: Edward Strong and Edward Strong granted to share info w AGENCY: SNF's  Permission granted to share info w Relationship: Sons  Permission granted to share info w Contact Information: Edward Strong: [phone]  Emotional Assessment Appearance:: Appears stated age Attitude/Demeanor/Rapport: Unable to Assess Affect (typically observed): Calm, Pleasant Orientation: : Oriented to Self, Oriented to Place, Oriented to  Time, Oriented to Situation Alcohol / Substance Use: Not Applicable Psych Involvement: No (comment)  Admission diagnosis:  AKI (acute kidney injury) ([place]) [N17.9] Acute renal failure, unspecified acute renal failure type ([place]) [N17.9] AMS (altered mental status) [R41.82] Patient Active Problem List   Diagnosis Date Noted  . Acute urinary retention 06/12/2019  . BPH with obstruction/lower urinary tract symptoms 06/12/2019  . UTI (  urinary tract infection) 06/12/2019  . Hyperglycemia due to type 2 diabetes mellitus ([place]) 06/11/2019  . Metabolic acidosis 123456  . S/P ORIF (open reduction internal fixation) fracture 06/11/2019  . Hypotension 06/11/2019  . Acute metabolic encephalopathy 123456  . AKI (acute kidney injury) ([place]) 06/11/2019  . Postoperative anemia due to acute blood loss 05/16/2019  . Acute kidney injury superimposed on chronic  kidney disease ([place])   . Closed fracture of left distal femur ([place]) 05/12/2019  . Chronic systolic CHF (congestive heart failure) ([place]) 05/12/2019  . Leukocytosis 05/12/2019  . Type II diabetes mellitus with renal manifestations ([place])   . HTN (hypertension)   . CKD (chronic kidney disease), stage IIIa   . Atrial fibrillation ([place])    PCP:  Edward Arthurs, NP Pharmacy:   [id number] - [place], [place] - [address] [place] [address] Phone: [phone] Fax: [phone]     Social Determinants of Health (SDOH) Interventions    Readmission Risk Interventions No flowsheet data found.

## 2019-06-13 NOTE — Progress Notes (Signed)
Order received from Dr Kumar for PT 

## 2019-06-13 NOTE — Progress Notes (Addendum)
Inpatient Diabetes Program Recommendations  AACE/ADA: New Consensus Statement on Inpatient Glycemic Control (2015)  Target Ranges:  Prepandial:   less than 140 mg/dL      Peak postprandial:   less than 180 mg/dL (1-2 hours)      Critically ill patients:  140 - 180 mg/dL   Results for Edward Strong, Edward Strong (MRN [id number]) as of 06/13/2019 11:14  Ref. Range 06/12/2019 07:42 06/12/2019 12:57 06/12/2019 16:41 06/12/2019 21:08  Glucose-Capillary Latest Ref Range: 70 - 99 mg/dL 201 (H)  3 units NOVOLOG  222 (H)  3 units NOVOLOG  246 (H)  3 units NOVOLOG  278 (H)  3 units NOVOLOG    Results for Edward Strong, Edward Strong (MRN [id number]) as of 06/13/2019 11:14  Ref. Range 06/13/2019 07:52  Glucose-Capillary Latest Ref Range: 70 - 99 mg/dL 254 (H)  5 units NOVOLOG     Home DM Meds: NPH Insulin 20 units QHS       Regular Insulin 5 units TID  Current Orders: Novolog Sensitive Correction Scale/ SSI (0-9 units) TID AC + HS   MD- Note that patient takes NPH Insulin as outpatient.  Please consider the following:  1. Start Lantus 10 units QHS (50% home dose to start)  2. Increase Novolog SSI to Moderate scale (0-15 units)     --Will follow patient during hospitalization--  Wyn Quaker RN, MSN, CDE Diabetes Coordinator Inpatient Glycemic Control Team Team Pager: [phone] (8a-5p)

## 2019-06-14 LAB — CBC
HCT: 31.3 % — ABNORMAL LOW (ref 39.0–52.0)
Hemoglobin: 10.4 g/dL — ABNORMAL LOW (ref 13.0–17.0)
MCH: 34.1 pg — ABNORMAL HIGH (ref 26.0–34.0)
MCHC: 33.2 g/dL (ref 30.0–36.0)
MCV: 102.6 fL — ABNORMAL HIGH (ref 80.0–100.0)
Platelets: 176 10*3/uL (ref 150–400)
RBC: 3.05 MIL/uL — ABNORMAL LOW (ref 4.22–5.81)
RDW: 14.2 % (ref 11.5–15.5)
WBC: 9.4 10*3/uL (ref 4.0–10.5)
nRBC: 0 % (ref 0.0–0.2)

## 2019-06-14 LAB — URINE CULTURE: Culture: 70000 — AB

## 2019-06-14 LAB — BASIC METABOLIC PANEL
Anion gap: 8 (ref 5–15)
BUN: 95 mg/dL — ABNORMAL HIGH (ref 8–23)
CO2: 26 mmol/L (ref 22–32)
Calcium: 8.4 mg/dL — ABNORMAL LOW (ref 8.9–10.3)
Chloride: 114 mmol/L — ABNORMAL HIGH (ref 98–111)
Creatinine, Ser: 3.07 mg/dL — ABNORMAL HIGH (ref 0.61–1.24)
GFR calc Af Amer: 20 mL/min — ABNORMAL LOW (ref >60)
GFR calc non Af Amer: 17 mL/min — ABNORMAL LOW (ref >60)
Glucose, Bld: 252 mg/dL — ABNORMAL HIGH (ref 70–99)
Potassium: 3.8 mmol/L (ref 3.5–5.1)
Sodium: 148 mmol/L — ABNORMAL HIGH (ref 135–145)

## 2019-06-14 LAB — GLUCOSE, CAPILLARY
Glucose-Capillary: 233 mg/dL — ABNORMAL HIGH (ref 70–99)
Glucose-Capillary: 255 mg/dL — ABNORMAL HIGH (ref 70–99)
Glucose-Capillary: 248 mg/dL — ABNORMAL HIGH (ref 70–99)
Glucose-Capillary: 177 mg/dL — ABNORMAL HIGH (ref 70–99)

## 2019-06-14 MED ORDER — INSULIN GLARGINE 100 UNIT/ML ~~LOC~~ SOLN
10.0000 [IU] | Freq: Every day | SUBCUTANEOUS | Status: DC
  Administered 2019-06-14 – 2019-06-15 (×2): 10 [IU] via SUBCUTANEOUS
  Filled 2019-06-14 (×3): qty 0.1

## 2019-06-14 MED ORDER — INSULIN ASPART 100 UNIT/ML ~~LOC~~ SOLN
0.0000 [IU] | Freq: Three times a day (TID) | SUBCUTANEOUS | Status: DC
  Administered 2019-06-14 – 2019-06-15 (×2): 5 [IU] via SUBCUTANEOUS
  Administered 2019-06-15 (×2): 3 [IU] via SUBCUTANEOUS
  Administered 2019-06-16: 8 [IU] via SUBCUTANEOUS
  Administered 2019-06-16: 3 [IU] via SUBCUTANEOUS
  Filled 2019-06-14 (×6): qty 1

## 2019-06-14 MED ORDER — INSULIN ASPART 100 UNIT/ML ~~LOC~~ SOLN
0.0000 [IU] | Freq: Every day | SUBCUTANEOUS | Status: DC
  Administered 2019-06-15: 2 [IU] via SUBCUTANEOUS
  Filled 2019-06-14: qty 1

## 2019-06-14 MED ORDER — CIPROFLOXACIN HCL 500 MG PO TABS
500.0000 mg | ORAL_TABLET | Freq: Every day | ORAL | Status: DC
  Administered 2019-06-14 – 2019-06-16 (×3): 500 mg via ORAL
  Filled 2019-06-14 (×3): qty 1

## 2019-06-14 NOTE — Progress Notes (Signed)
PROGRESS NOTE    Edward Strong  [id number] DOB: 1929/06/21 DOA: 06/11/2019 PCP: Toni Arthurs, NP   Brief Narrative:  Edward Strong a 86 y.o.malewith medical history significant forinsulin-dependent type 2 diabetes, CKD 3A, chronic A. fib on Eliquis, CAD, systolic heart failure, documented history of Hodgkin's lymphoma,recently underwent ORIF on 05/13/2019, postoperative course complicated by postoperative bleeding requiring 1 unit PRBC who also has a history severe urinary retention secondary to BPH who has been self-catheterizing four times a day until his recent discharge to rehab,who was sent to the emergency room by the nursing home where he was receiving rehab for abnormal labs.  Found to have AKI secondary to urinary retention and UTI, along with metabolic acidosis and hyperkalemia which has been resolved now.  Subjective: Patient was feeling better when seen during morning rounds.  He was able to tolerate diet very well.  Oriented to self only.  Assessment & Plan:   Principal Problem:   Acute kidney injury superimposed on chronic kidney disease ([place]) Active Problems:   HTN (hypertension)   CKD (chronic kidney disease), stage IIIa   Atrial fibrillation (HCC)   Chronic systolic CHF (congestive heart failure) (HCC)   Hyperglycemia due to type 2 diabetes mellitus (HCC)   Metabolic acidosis   S/P ORIF (open reduction internal fixation) fracture   Hypotension   Acute metabolic encephalopathy   AKI (acute kidney injury) ([place])   Acute urinary retention   BPH with obstruction/lower urinary tract symptoms   UTI (urinary tract infection)  AKI with CKD stage IIIa/metabolic acidosis/hyperkalemia. Metabolic acidosis resolved with bicarbonate infusion.  Hyperkalemia resolved, potassium was 3.8.  Patient was given Veltassa. Creatinine improving, peaked at 7.9 with baseline of 1.8, today it was 3.07 with BUN of 95. Renal ultrasound: Bilateral simple renal cysts are noted, the  largest measuring 10.5 cm on the left. No other renal abnormality is noted. Most likely due to urinary retention and UTI.  Patient is with Foley now. Urology will see him as an outpatient. -Discontinue bicarb infusion. -Encouraged p.o. hydration as patient is taking orally. -Monitor renal functions. -Avoid nephrotoxic. -Neurology was also consulted-appreciate their recommendations.  UTI.  Urine culture with Enterobacter cloacae.  Which shows resistant to cefazolin and nitrofurantoin.  Sensitive to Cipro, gentamicin, meropenem and Zosyn. -Switch antibiotics with Cipro.  Acute on chronic urinary retention secondary to BPH with LUTS. Patient used to do in and out catheter before.  Currently on Foley which resulted in improvement of renal function. Discussed with Dr Erlene Quan, urologist via secure chat: "I really don't think there's anything for Edward Strong to add in his care are the moment. Reviewed chart. If cr fails to improve with foley or if there are any other specific concerns, glad to see (like bleeding, catheter problems, etc). If the concern is just that he has chronic retention, not something that we can change or address during this admission anyway and recommend outpt f/u." -Outpatient referral back to urology as per recommendation.  Hypertension.  Pressure within goal today. Antihypertensives were held due to initial softer blood pressure. Patient was on Lasix and losartan at home which will be avoided at this time due to AKI. -Continue to monitor.  Acute metabolic encephalopathy.  Resolved.  Patient appears at baseline now. Secondary to dehydration and metabolic derangements related to AKI.  Atrial fibrillation ([place]) Pharmacy to dose Eliquis 2.5 mg p.o. twice daily started on 06/12/2019 Patient is on chronic Eliquis Currently rate controlled. Not on rate control agent  Type 2 diabetes  mellitus.  A1c of 7.8 in January 2021. CBG mildly elevated in 200s. -Start him on Lantus 10 units at  bedtime-taking 20 units at home. -Change sensitive sliding scale to moderate. -Continue to monitor.  Chronic systolic CHF (congestive heart failure) (HCC) Hold Lasix and losartan.Not currently on beta-blockers for uncertain reason Monitor for fluid overload in view of IV hydration  S/P ORIF (open reduction internal fixation) fracture Patient currently in rehab No acute problems  Pressure Injury 06/11/19 Sacrum Mid Unstageable - Full thickness tissue loss in which the base of the injury is covered by slough (yellow, tan, gray, green or brown) and/or eschar (tan, brown or black) in the wound bed. (Active)  06/11/19   Location: Sacrum  Location Orientation: Mid  Staging: Unstageable - Full thickness tissue loss in which the base of the injury is covered by slough (yellow, tan, gray, green or brown) and/or eschar (tan, brown or black) in the wound bed.  Wound Description (Comments):   Present on Admission: Yes    Objective: Vitals:   06/13/19 2007 06/14/19 0520 06/14/19 0800 06/14/19 1301  BP: (!) 95/52 137/64 125/71 (!) 134/59  Pulse: 74 72 72 75  Resp: 20 20 16 18   Temp: 98.1 F (36.7 C) 97.6 F (36.4 C) 97.9 F (36.6 C) 98 F (36.7 C)  TempSrc: Oral Axillary Oral Oral  SpO2: 97% 94% 99% 99%  Weight:      Height:        Intake/Output Summary (Last 24 hours) at 06/14/2019 1441 Last data filed at 06/14/2019 1000 Gross per 24 hour  Intake 1669.28 ml  Output 2325 ml  Net -655.72 ml   Filed Weights   06/11/19 1831 06/11/19 2324  Weight: 81.6 kg 92.1 kg    Examination:  General exam: Conically ill-appearing elderly man ,appears calm and comfortable  Respiratory system: Clear to auscultation. Respiratory effort normal. Cardiovascular system: S1 & S2 heard, RRR. No JVD, murmurs, rubs, gallops or clicks. Gastrointestinal system: Soft, nontender, nondistended, bowel sounds positive. Central nervous system: Alert and oriented. No focal neurological deficits.Symmetric 5  x 5 power. Extremities: No edema, leg in brace, no cyanosis, pulses intact and symmetrical. Skin: No rashes, lesions or ulcers Psychiatry: Judgement and insight appear impaired.   DVT prophylaxis: Eliquis Code Status: Full Family Communication: No Family at bedside. Disposition Plan: Pending improvement in renal function.  PT is recommending SNF placement.  Consultants:   Nephrology  Procedures:  Antimicrobials:   Data Reviewed: I have personally reviewed following labs and imaging studies  CBC: Recent Labs  Lab 06/11/19 1827 06/12/19 0351 06/13/19 0408 06/14/19 0624  WBC 10.0 8.8 10.0 9.4  HGB 12.1* 10.8* 10.2* 10.4*  HCT 37.1* 33.7* 30.6* 31.3*  MCV 103.1* 104.3* 102.3* 102.6*  PLT 208 187 184 0000000   Basic Metabolic Panel: Recent Labs  Lab 06/11/19 1827 06/12/19 0003 06/12/19 0351 06/13/19 0408 06/14/19 0624  NA 137 140 141 144 148*  K 5.8* 5.5* 5.3* 4.3 3.8  CL 109 115* 115* 116* 114*  CO2 16* 13* 14* 20* 26  GLUCOSE 312* 235* 216* 251* 252*  BUN 161* 167* 159* 142* 95*  CREATININE 7.09* 6.90* 6.52* 4.76* 3.07*  CALCIUM 8.9 8.7* 8.7* 8.6* 8.4*  MG  --   --   --  2.4  --   PHOS  --   --   --  4.3  --    GFR: Estimated Creatinine Clearance: 19.3 mL/min (A) (by C-G formula based on SCr of 3.07 mg/dL (H)).  Liver Function Tests: Recent Labs  Lab 06/11/19 1827 06/12/19 0351  AST 48* 28  ALT 58* 46*  ALKPHOS 200* 168*  BILITOT 0.9 0.9  PROT 7.0 6.1*  ALBUMIN 2.6* 2.3*   No results for input(s): LIPASE, AMYLASE in the last 168 hours. No results for input(s): AMMONIA in the last 168 hours. Coagulation Profile: No results for input(s): INR, PROTIME in the last 168 hours. Cardiac Enzymes: No results for input(s): CKTOTAL, CKMB, CKMBINDEX, TROPONINI in the last 168 hours. BNP (last 3 results) No results for input(s): PROBNP in the last 8760 hours. HbA1C: No results for input(s): HGBA1C in the last 72 hours. CBG: Recent Labs  Lab 06/13/19 1232  06/13/19 1627 06/13/19 2115 06/14/19 0741 06/14/19 1159  GLUCAP 334* 244* 280* 233* 255*   Lipid Profile: No results for input(s): CHOL, HDL, LDLCALC, TRIG, CHOLHDL, LDLDIRECT in the last 72 hours. Thyroid Function Tests: No results for input(s): TSH, T4TOTAL, FREET4, T3FREE, THYROIDAB in the last 72 hours. Anemia Panel: No results for input(s): VITAMINB12, FOLATE, FERRITIN, TIBC, IRON, RETICCTPCT in the last 72 hours. Sepsis Labs: Recent Labs  Lab 06/12/19 0109 06/12/19 0351  LATICACIDVEN 2.0* 1.6    Recent Results (from the past 240 hour(s))  Respiratory Panel by RT PCR (Flu A&B, Covid) - Nasopharyngeal Swab     Status: None   Collection Time: 06/11/19  6:27 PM   Specimen: Nasopharyngeal Swab  Result Value Ref Range Status   SARS Coronavirus 2 by RT PCR NEGATIVE NEGATIVE Final    Comment: (NOTE) SARS-CoV-2 target nucleic acids are NOT DETECTED. The SARS-CoV-2 RNA is generally detectable in upper respiratoy specimens during the acute phase of infection. The lowest concentration of SARS-CoV-2 viral copies this assay can detect is 131 copies/mL. A negative result does not preclude SARS-Cov-2 infection and should not be used as the sole basis for treatment or other patient management decisions. A negative result may occur with  improper specimen collection/handling, submission of specimen other than nasopharyngeal swab, presence of viral mutation(s) within the areas targeted by this assay, and inadequate number of viral copies (<131 copies/mL). A negative result must be combined with clinical observations, patient history, and epidemiological information. The expected result is Negative. Fact Sheet for Patients:  PinkCheek.be Fact Sheet for Healthcare Providers:  GravelBags.it This test is not yet ap proved or cleared by the [place] FDA and  has been authorized for detection and/or diagnosis of SARS-CoV-2  by FDA under an Emergency Use Authorization (EUA). This EUA will remain  in effect (meaning this test can be used) for the duration of the COVID-19 declaration under Section 564(b)(1) of the Act, 21 U.S.C. section 360bbb-3(b)(1), unless the authorization is terminated or revoked sooner.    Influenza A by PCR NEGATIVE NEGATIVE Final   Influenza B by PCR NEGATIVE NEGATIVE Final    Comment: (NOTE) The Xpert Xpress SARS-CoV-2/FLU/RSV assay is intended as an aid in  the diagnosis of influenza from Nasopharyngeal swab specimens and  should not be used as a sole basis for treatment. Nasal washings and  aspirates are unacceptable for Xpert Xpress SARS-CoV-2/FLU/RSV  testing. Fact Sheet for Patients: PinkCheek.be Fact Sheet for Healthcare Providers: GravelBags.it This test is not yet approved or cleared by the [place] FDA and  has been authorized for detection and/or diagnosis of SARS-CoV-2 by  FDA under an Emergency Use Authorization (EUA). This EUA will remain  in effect (meaning this test can be used) for the duration of the  Covid-19 declaration under  Section 564(b)(1) of the Act, 21  U.S.C. section 360bbb-3(b)(1), unless the authorization is  terminated or revoked. Performed at [facility], [address] [place], [place] [address]   Urine Culture     Status: Abnormal   Collection Time: 06/11/19 10:02 PM   Specimen: Urine, Random  Result Value Ref Range Status   Specimen Description   Final    URINE, RANDOM Performed at [facility], [address] [place], [place] [address]    Special Requests   Final    NONE Performed at [facility], [address] [place], [place] [address]    Culture 70,000 COLONIES/mL ENTEROBACTER CLOACAE (A)  Final   Report Status 06/14/2019 FINAL  Final   Organism ID, Bacteria ENTEROBACTER CLOACAE (A)  Final      Susceptibility   Enterobacter cloacae -  MIC*    CEFAZOLIN >=64 RESISTANT Resistant     CIPROFLOXACIN <=0.25 SENSITIVE Sensitive     GENTAMICIN <=1 SENSITIVE Sensitive     IMIPENEM <=0.25 SENSITIVE Sensitive     NITROFURANTOIN 128 RESISTANT Resistant     TRIMETH/SULFA <=20 SENSITIVE Sensitive     PIP/TAZO 16 SENSITIVE Sensitive     * 70,000 COLONIES/mL ENTEROBACTER CLOACAE     Radiology Studies: DG FEMUR MIN 2 VIEWS LEFT  Result Date: 06/13/2019 CLINICAL DATA:  Left femur ORIF 05/13/2019 EXAM: LEFT FEMUR 2 VIEWS COMPARISON:  None. FINDINGS: Comminuted distal femoral diametaphyseal fracture just above the distal femoral arthroplasty component transfixed with a lateral sideplate and multiple interlocking screws and a single cerclage wire. No hardware failure or complication. Fracture is in near anatomic alignment. Generalized osteopenia. No other fracture or dislocation. Prior total knee arthroplasty in satisfactory position. Postsurgical changes in the surrounding soft tissues. Peripheral vascular atherosclerotic disease. IMPRESSION: Interval distal femoral diametaphyseal ORIF. Electronically Signed   By: Kathreen Devoid   On: 06/13/2019 09:49    Scheduled Meds: . apixaban  2.5 mg Oral BID  . Chlorhexidine Gluconate Cloth  6 each Topical Daily  . ciprofloxacin  500 mg Oral Q breakfast  . insulin aspart  0-5 Units Subcutaneous QHS  . insulin aspart  0-9 Units Subcutaneous TID WC  . patiromer  16.8 g Oral Daily   Continuous Infusions:   LOS: 3 days   Time spent: 45 minutes.  Lorella Nimrod, MD [facility]  If 7PM-7AM, please contact night-coverage [email or web address]  06/14/2019, 2:41 PM   This record has been created using Systems analyst. Errors have been sought and corrected,but may not always be located. Such creation errors do not reflect on the standard of care.

## 2019-06-15 LAB — BASIC METABOLIC PANEL
Anion gap: 10 (ref 5–15)
BUN: 80 mg/dL — ABNORMAL HIGH (ref 8–23)
CO2: 24 mmol/L (ref 22–32)
Calcium: 8.6 mg/dL — ABNORMAL LOW (ref 8.9–10.3)
Chloride: 116 mmol/L — ABNORMAL HIGH (ref 98–111)
Creatinine, Ser: 2.7 mg/dL — ABNORMAL HIGH (ref 0.61–1.24)
GFR calc Af Amer: 23 mL/min — ABNORMAL LOW (ref >60)
GFR calc non Af Amer: 20 mL/min — ABNORMAL LOW (ref >60)
Glucose, Bld: 167 mg/dL — ABNORMAL HIGH (ref 70–99)
Potassium: 3.8 mmol/L (ref 3.5–5.1)
Sodium: 150 mmol/L — ABNORMAL HIGH (ref 135–145)

## 2019-06-15 LAB — CBC
HCT: 34 % — ABNORMAL LOW (ref 39.0–52.0)
Hemoglobin: 11 g/dL — ABNORMAL LOW (ref 13.0–17.0)
MCH: 33.6 pg (ref 26.0–34.0)
MCHC: 32.4 g/dL (ref 30.0–36.0)
MCV: 104 fL — ABNORMAL HIGH (ref 80.0–100.0)
Platelets: 184 10*3/uL (ref 150–400)
RBC: 3.27 MIL/uL — ABNORMAL LOW (ref 4.22–5.81)
RDW: 14.1 % (ref 11.5–15.5)
WBC: 9.9 10*3/uL (ref 4.0–10.5)
nRBC: 0 % (ref 0.0–0.2)

## 2019-06-15 LAB — GLUCOSE, CAPILLARY
Glucose-Capillary: 166 mg/dL — ABNORMAL HIGH (ref 70–99)
Glucose-Capillary: 209 mg/dL — ABNORMAL HIGH (ref 70–99)
Glucose-Capillary: 172 mg/dL — ABNORMAL HIGH (ref 70–99)
Glucose-Capillary: 218 mg/dL — ABNORMAL HIGH (ref 70–99)

## 2019-06-15 MED ORDER — SODIUM CHLORIDE 0.45 % IV SOLN: INTRAVENOUS | Status: AC

## 2019-06-15 NOTE — Progress Notes (Signed)
PROGRESS NOTE    Edward Strong  [id number] DOB: 04/06/1930 DOA: 06/11/2019 PCP: Edward Arthurs, NP   Brief Narrative:  Edward Strong a 86 y.o.malewith medical history significant forinsulin-dependent type 2 diabetes, CKD 3A, chronic A. fib on Eliquis, CAD, systolic heart failure, documented history of Hodgkin's lymphoma,recently underwent ORIF on 05/13/2019, postoperative course complicated by postoperative bleeding requiring 1 unit PRBC who also has a history severe urinary retention secondary to BPH who has been self-catheterizing four times a day until his recent discharge to rehab,who was sent to the emergency room by the nursing home where he was receiving rehab for abnormal labs.  Found to have AKI secondary to urinary retention and UTI, along with metabolic acidosis and hyperkalemia which has been resolved now.  Subjective: Patient appears more lethargic when seen this morning.  He had one vomitus with breakfast.  He denies any more nausea but states that he was feeling weak.  Assessment & Plan:   Principal Problem:   Acute kidney injury superimposed on chronic kidney disease ([place]) Active Problems:   HTN (hypertension)   CKD (chronic kidney disease), stage IIIa   Atrial fibrillation (HCC)   Chronic systolic CHF (congestive heart failure) (HCC)   Hyperglycemia due to type 2 diabetes mellitus (HCC)   Metabolic acidosis   S/P ORIF (open reduction internal fixation) fracture   Hypotension   Acute metabolic encephalopathy   AKI (acute kidney injury) ([place])   Acute urinary retention   BPH with obstruction/lower urinary tract symptoms   UTI (urinary tract infection)  AKI with CKD stage IIIa/metabolic acidosis/hyperkalemia. Metabolic acidosis resolved with bicarbonate infusion.  Hyperkalemia resolved, potassium was 3.8.  Patient was given Veltassa. Creatinine improving, peaked at 7.9 with baseline of 1.8, today it was 2.70 with BUN of 80. Renal ultrasound: Bilateral simple  renal cysts are noted, the largest measuring 10.5 cm on the left. No other renal abnormality is noted. Most likely due to urinary retention and UTI.  Patient is with Foley now. Urology will see him as an outpatient. -Encouraged p.o. hydration as patient is taking orally. -Monitor renal functions. -Avoid nephrotoxic. -Nephrology was also consulted-appreciate their recommendations.  Hypernatremia.  Patient developed hypernatremia today more likely due to decreased p.o. intake.  Free water deficit of 3.3 L. -Start him on hypotonic saline-avoiding D5 due to elevated blood glucose. -Monitor sodium levels-if continues to remain high then we will switch him to D5 with close monitoring.  UTI.  Urine culture with Enterobacter cloacae.  Which shows resistant to cefazolin and nitrofurantoin.  Sensitive to Cipro, gentamicin, meropenem and Zosyn. -Switch antibiotics with Cipro.  Acute on chronic urinary retention secondary to BPH with LUTS. Patient used to do in and out catheter before.  Currently on Foley which resulted in improvement of renal function. Discussed with Dr Erlene Quan, urologist via secure chat: "I really don't think there's anything for [place] to add in his care are the moment. Reviewed chart. If cr fails to improve with foley or if there are any other specific concerns, glad to see (like bleeding, catheter problems, etc). If the concern is just that he has chronic retention, not something that we can change or address during this admission anyway and recommend outpt f/u." -Outpatient referral back to urology as per recommendation.  Hypertension.  Pressure within goal today. Antihypertensives were held due to initial softer blood pressure. Patient was on Lasix and losartan at home which will be avoided at this time due to AKI. -Continue to monitor.  Acute metabolic  encephalopathy.  Resolved.  Patient appears at baseline now. Secondary to dehydration and metabolic derangements related to  AKI. Appears little more lethargic today.  BUN still elevated. -Continue to monitor.  Atrial fibrillation ([place]) Pharmacy to dose Eliquis 2.5 mg p.o. twice daily started on 06/12/2019 Patient is on chronic Eliquis Currently rate controlled. Not on rate control agent  Type 2 diabetes mellitus.  A1c of 7.8 in January 2021. CBG mildly elevated although improved than before. -Continue Lantus 10 units at bedtime-taking 20 units at home. -Continue moderate sliding scale. -Continue to monitor.  Chronic systolic CHF (congestive heart failure) (HCC) Hold Lasix and losartan.Not currently on beta-blockers for uncertain reason Monitor for fluid overload in view of IV hydration  Sacral pressure ulcer.  Nurses noted a unstageable sacral pressure ulcer. -We will get wound care evaluation.  S/P ORIF (open reduction internal fixation) fracture Patient currently in rehab No acute problems  Pressure Injury 06/11/19 Sacrum Mid Unstageable - Full thickness tissue loss in which the base of the injury is covered by slough (yellow, tan, gray, green or brown) and/or eschar (tan, brown or black) in the wound bed. (Active)  06/11/19   Location: Sacrum  Location Orientation: Mid  Staging: Unstageable - Full thickness tissue loss in which the base of the injury is covered by slough (yellow, tan, gray, green or brown) and/or eschar (tan, brown or black) in the wound bed.  Wound Description (Comments):   Present on Admission: Yes    Objective: Vitals:   06/14/19 1957 06/15/19 0506 06/15/19 0929 06/15/19 1350  BP: 110/71 128/75 123/68 112/64  Pulse: 73 65 77 66  Resp: 18 20 18 16   Temp: 98.3 F (36.8 C) (!) 97.4 F (36.3 C) 97.6 F (36.4 C) 98 F (36.7 C)  TempSrc: Oral Oral Oral Oral  SpO2: 99% 97% 96% 98%  Weight:      Height:        Intake/Output Summary (Last 24 hours) at 06/15/2019 1449 Last data filed at 06/15/2019 0500 Gross per 24 hour  Intake 240 ml  Output 1600 ml  Net -1360 ml    Filed Weights   06/11/19 1831 06/11/19 2324  Weight: 81.6 kg 92.1 kg    Examination:  General exam: Conically ill-appearing elderly man ,appears calm and comfortable  Respiratory system: Clear to auscultation. Respiratory effort normal. Cardiovascular system: S1 & S2 heard, RRR. No JVD, murmurs, rubs, gallops or clicks. Gastrointestinal system: Soft, nontender, nondistended, bowel sounds positive. Central nervous system: Alert and oriented. No focal neurological deficits.Symmetric 5 x 5 power. Extremities: No edema, leg in brace, no cyanosis, pulses intact and symmetrical. Skin: No rashes, lesions or ulcers Psychiatry: Judgement and insight appear impaired.   DVT prophylaxis: Eliquis Code Status: Full Family Communication: No Family at bedside. Disposition Plan: Pending improvement in renal function.  PT is recommending SNF placement.  Patient originally from [facility] where he was getting rehab but will not return due to poor treatment according to discussion between social worker and son Jamard.  They want him to be transferred to a rehab facility at [facility].  Social worker working on it.  Consultants:   Nephrology  Procedures:  Antimicrobials:  Cipro  Data Reviewed: I have personally reviewed following labs and imaging studies  CBC: Recent Labs  Lab 06/11/19 1827 06/12/19 0351 06/13/19 0408 06/14/19 0624 06/15/19 0644  WBC 10.0 8.8 10.0 9.4 9.9  HGB 12.1* 10.8* 10.2* 10.4* 11.0*  HCT 37.1* 33.7* 30.6* 31.3* 34.0*  MCV 103.1*  104.3* 102.3* 102.6* 104.0*  PLT 208 187 184 176 [id number]   Basic Metabolic Panel: Recent Labs  Lab 06/12/19 0003 06/12/19 0351 06/13/19 0408 06/14/19 0624 06/15/19 0644  NA 140 141 144 148* 150*  K 5.5* 5.3* 4.3 3.8 3.8  CL 115* 115* 116* 114* 116*  CO2 13* 14* 20* 26 24  GLUCOSE 235* 216* 251* 252* 167*  BUN 167* 159* 142* 95* 80*  CREATININE 6.90* 6.52* 4.76* 3.07* 2.70*  CALCIUM 8.7* 8.7* 8.6* 8.4* 8.6*  MG  --    --  2.4  --   --   PHOS  --   --  4.3  --   --    GFR: Estimated Creatinine Clearance: 22 mL/min (A) (by C-G formula based on SCr of 2.7 mg/dL (H)). Liver Function Tests: Recent Labs  Lab 06/11/19 1827 06/12/19 0351  AST 48* 28  ALT 58* 46*  ALKPHOS 200* 168*  BILITOT 0.9 0.9  PROT 7.0 6.1*  ALBUMIN 2.6* 2.3*   No results for input(s): LIPASE, AMYLASE in the last 168 hours. No results for input(s): AMMONIA in the last 168 hours. Coagulation Profile: No results for input(s): INR, PROTIME in the last 168 hours. Cardiac Enzymes: No results for input(s): CKTOTAL, CKMB, CKMBINDEX, TROPONINI in the last 168 hours. BNP (last 3 results) No results for input(s): PROBNP in the last 8760 hours. HbA1C: No results for input(s): HGBA1C in the last 72 hours. CBG: Recent Labs  Lab 06/14/19 1159 06/14/19 1651 06/14/19 2058 06/15/19 0756 06/15/19 1157  GLUCAP 255* 248* 177* 166* 209*   Lipid Profile: No results for input(s): CHOL, HDL, LDLCALC, TRIG, CHOLHDL, LDLDIRECT in the last 72 hours. Thyroid Function Tests: No results for input(s): TSH, T4TOTAL, FREET4, T3FREE, THYROIDAB in the last 72 hours. Anemia Panel: No results for input(s): VITAMINB12, FOLATE, FERRITIN, TIBC, IRON, RETICCTPCT in the last 72 hours. Sepsis Labs: Recent Labs  Lab 06/12/19 0109 06/12/19 0351  LATICACIDVEN 2.0* 1.6    Recent Results (from the past 240 hour(s))  Respiratory Panel by RT PCR (Flu A&B, Covid) - Nasopharyngeal Swab     Status: None   Collection Time: 06/11/19  6:27 PM   Specimen: Nasopharyngeal Swab  Result Value Ref Range Status   SARS Coronavirus 2 by RT PCR NEGATIVE NEGATIVE Final    Comment: (NOTE) SARS-CoV-2 target nucleic acids are NOT DETECTED. The SARS-CoV-2 RNA is generally detectable in upper respiratoy specimens during the acute phase of infection. The lowest concentration of SARS-CoV-2 viral copies this assay can detect is 131 copies/mL. A negative result does not preclude  SARS-Cov-2 infection and should not be used as the sole basis for treatment or other patient management decisions. A negative result may occur with  improper specimen collection/handling, submission of specimen other than nasopharyngeal swab, presence of viral mutation(s) within the areas targeted by this assay, and inadequate number of viral copies (<131 copies/mL). A negative result must be combined with clinical observations, patient history, and epidemiological information. The expected result is Negative. Fact Sheet for Patients:  PinkCheek.be Fact Sheet for Healthcare Providers:  GravelBags.it This test is not yet ap proved or cleared by the [place] FDA and  has been authorized for detection and/or diagnosis of SARS-CoV-2 by FDA under an Emergency Use Authorization (EUA). This EUA will remain  in effect (meaning this test can be used) for the duration of the COVID-19 declaration under Section 564(b)(1) of the Act, 21 U.S.C. section 360bbb-3(b)(1), unless the authorization is terminated or revoked sooner.  Influenza A by PCR NEGATIVE NEGATIVE Final   Influenza B by PCR NEGATIVE NEGATIVE Final    Comment: (NOTE) The Xpert Xpress SARS-CoV-2/FLU/RSV assay is intended as an aid in  the diagnosis of influenza from Nasopharyngeal swab specimens and  should not be used as a sole basis for treatment. Nasal washings and  aspirates are unacceptable for Xpert Xpress SARS-CoV-2/FLU/RSV  testing. Fact Sheet for Patients: PinkCheek.be Fact Sheet for Healthcare Providers: GravelBags.it This test is not yet approved or cleared by the [place] FDA and  has been authorized for detection and/or diagnosis of SARS-CoV-2 by  FDA under an Emergency Use Authorization (EUA). This EUA will remain  in effect (meaning this test can be used) for the duration of the  Covid-19  declaration under Section 564(b)(1) of the Act, 21  U.S.C. section 360bbb-3(b)(1), unless the authorization is  terminated or revoked. Performed at [facility], [address] [place], [place] [address]   Urine Culture     Status: Abnormal   Collection Time: 06/11/19 10:02 PM   Specimen: Urine, Random  Result Value Ref Range Status   Specimen Description   Final    URINE, RANDOM Performed at [facility], [address] [place], [place] [address]    Special Requests   Final    NONE Performed at [facility], [address] [place], [place] [address]    Culture 70,000 COLONIES/mL ENTEROBACTER CLOACAE (A)  Final   Report Status 06/14/2019 FINAL  Final   Organism ID, Bacteria ENTEROBACTER CLOACAE (A)  Final      Susceptibility   Enterobacter cloacae - MIC*    CEFAZOLIN >=64 RESISTANT Resistant     CIPROFLOXACIN <=0.25 SENSITIVE Sensitive     GENTAMICIN <=1 SENSITIVE Sensitive     IMIPENEM <=0.25 SENSITIVE Sensitive     NITROFURANTOIN 128 RESISTANT Resistant     TRIMETH/SULFA <=20 SENSITIVE Sensitive     PIP/TAZO 16 SENSITIVE Sensitive     * 70,000 COLONIES/mL ENTEROBACTER CLOACAE     Radiology Studies: No results found.  Scheduled Meds: . apixaban  2.5 mg Oral BID  . Chlorhexidine Gluconate Cloth  6 each Topical Daily  . ciprofloxacin  500 mg Oral Q breakfast  . insulin aspart  0-15 Units Subcutaneous TID WC  . insulin aspart  0-5 Units Subcutaneous QHS  . insulin glargine  10 Units Subcutaneous QHS  . patiromer  16.8 g Oral Daily   Continuous Infusions: . sodium chloride 100 mL/hr at 06/15/19 0917     LOS: 4 days   Time spent: 40 minutes.  Lorella Nimrod, MD [facility]  If 7PM-7AM, please contact night-coverage [email or web address]  06/15/2019, 2:49 PM   This record has been created using Systems analyst. Errors have been sought and corrected,but may not always be located. Such creation errors do not reflect  on the standard of care.

## 2019-06-16 LAB — RENAL FUNCTION PANEL
Albumin: 2.2 g/dL — ABNORMAL LOW (ref 3.5–5.0)
Anion gap: 7 (ref 5–15)
BUN: 71 mg/dL — ABNORMAL HIGH (ref 8–23)
CO2: 23 mmol/L (ref 22–32)
Calcium: 8.3 mg/dL — ABNORMAL LOW (ref 8.9–10.3)
Chloride: 115 mmol/L — ABNORMAL HIGH (ref 98–111)
Creatinine, Ser: 2.47 mg/dL — ABNORMAL HIGH (ref 0.61–1.24)
GFR calc Af Amer: 26 mL/min — ABNORMAL LOW (ref >60)
GFR calc non Af Amer: 23 mL/min — ABNORMAL LOW (ref >60)
Glucose, Bld: 160 mg/dL — ABNORMAL HIGH (ref 70–99)
Phosphorus: 3.2 mg/dL (ref 2.5–4.6)
Potassium: 4.3 mmol/L (ref 3.5–5.1)
Sodium: 145 mmol/L (ref 135–145)

## 2019-06-16 LAB — GLUCOSE, CAPILLARY
Glucose-Capillary: 169 mg/dL — ABNORMAL HIGH (ref 70–99)
Glucose-Capillary: 283 mg/dL — ABNORMAL HIGH (ref 70–99)

## 2019-06-16 LAB — RESPIRATORY PANEL BY RT PCR (FLU A&B, COVID)
Influenza A by PCR: NEGATIVE
Influenza B by PCR: NEGATIVE
SARS Coronavirus 2 by RT PCR: NEGATIVE

## 2019-06-16 MED ORDER — FUROSEMIDE 40 MG PO TABS: 40.0000 mg | ORAL_TABLET | Freq: Every day | ORAL | Status: DC | PRN

## 2019-06-16 MED ORDER — CIPROFLOXACIN HCL 500 MG PO TABS: 500.0000 mg | ORAL_TABLET | Freq: Every day | ORAL | 0 refills | Status: AC

## 2019-06-16 MED ORDER — COLLAGENASE 250 UNIT/GM EX OINT
TOPICAL_OINTMENT | Freq: Every day | CUTANEOUS | Status: DC
  Filled 2019-06-16: qty 30

## 2019-06-16 NOTE — Care Management Important Message (Signed)
Important Message  Patient Details  Name: Edward Strong MRN: [id number] Date of Birth: 03-Oct-1929   Medicare Important Message Given:  Yes     Dannette Barbara 06/16/2019, 11:23 AM

## 2019-06-16 NOTE — Progress Notes (Signed)
Physical Therapy Treatment Patient Details Name: Edward Strong MRN: [id number] DOB: 10-12-1929 Today's Date: 06/16/2019    History of Present Illness Pt admitted for AKI secondary to CKD. HIstory of L distal femur fracture s/p ORIF on 05/23/19 currently NWB with hinged brace. Other history includes DM, Afib, CAD, and hodgkins lymphoma. Currently from SNF at this time, however family is hopeful to dc to other SNF.    PT Comments    Pt is making improved progress towards goals with ability to participate in therapy this date. 2 attempts for standing with pt able to follow commands. Due to poor strength/balance, unable to achieve upright posture or maintain correct WBing precautions. Able to perform supine strengthening there-ex and encouraged to continue. Will continue to progress as able.   Follow Up Recommendations  SNF     Equipment Recommendations  None recommended by PT    Recommendations for Other Services       Precautions / Restrictions Precautions Precautions: Fall Knee Immobilizer - Left: On at all times Restrictions Weight Bearing Restrictions: Yes LLE Weight Bearing: Non weight bearing    Mobility  Bed Mobility Overal bed mobility: Needs Assistance Bed Mobility: Supine to Sit;Sit to Supine     Supine to sit: Mod assist Sit to supine: Min assist;+2 for physical assistance   General bed mobility comments: able to follow commands for initiating bed mobility with B LEs. Needs assist for trunk support. ONce seated at EOB, able to maintain balance with upright posture. Needs +2 for returning supine with assist for scooting up towrads HOB  Transfers Overall transfer level: Needs assistance Equipment used: Rolling walker (2 wheeled) Transfers: Sit to/from Stand Sit to Stand: Max assist;+2 physical assistance         General transfer comment: elevated surface. Transfer attempted 2 attempts with pt unable to maintain correct WBing precautions and R knee buckling.  Unable to achieve standing upright posture.   Ambulation/Gait             General Gait Details: unable   Stairs             Wheelchair Mobility    Modified Rankin (Stroke Patients Only)       Balance Overall balance assessment: Needs assistance;History of Falls Sitting-balance support: Bilateral upper extremity supported;Feet supported Sitting balance-Leahy Scale: Good     Standing balance support: Bilateral upper extremity supported Standing balance-Leahy Scale: Zero Standing balance comment: post leaning                            Cognition Arousal/Alertness: Awake/alert Behavior During Therapy: WFL for tasks assessed/performed Overall Cognitive Status: History of cognitive impairments - at baseline                                 General Comments: more alert this date, aware of place and year      Exercises Other Exercises Other Exercises: ther-ex performed in supine position including B LE AP, SLRs, hip abd/add, and R LE heel slides. All ther-ex performed x 10 reps with min assist for correct form    General Comments        Pertinent Vitals/Pain Pain Assessment: No/denies pain    Home Living                      Prior Function  PT Goals (current goals can now be found in the care plan section) Acute Rehab PT Goals Patient Stated Goal: to get stronger PT Goal Formulation: With patient Time For Goal Achievement: 06/27/19 Potential to Achieve Goals: Good Progress towards PT goals: Progressing toward goals    Frequency    Min 2X/week      PT Plan Current plan remains appropriate    Co-evaluation              AM-PAC PT "6 Clicks" Mobility   Outcome Measure  Help needed turning from your back to your side while in a flat bed without using bedrails?: A Little Help needed moving from lying on your back to sitting on the side of a flat bed without using bedrails?: A Little Help needed  moving to and from a bed to a chair (including a wheelchair)?: Total Help needed standing up from a chair using your arms (e.g., wheelchair or bedside chair)?: A Lot Help needed to walk in hospital room?: Total Help needed climbing 3-5 steps with a railing? : Total 6 Click Score: 11    End of Session Equipment Utilized During Treatment: Gait belt Activity Tolerance: Patient tolerated treatment well Patient left: in bed;with bed alarm set Nurse Communication: Mobility status PT Visit Diagnosis: Muscle weakness (generalized) (M62.81);Difficulty in walking, not elsewhere classified (R26.2)     Time: [id number] PT Time Calculation (min) (ACUTE ONLY): 23 min  Charges:  $Therapeutic Exercise: 8-22 mins $Therapeutic Activity: 8-22 mins                     Greggory Stallion, PT, DPT [phone]    Zetha Kuhar 06/16/2019, 10:36 AM

## 2019-06-16 NOTE — Consult Note (Signed)
[place] Nurse Consult Note: Reason for Consult: Consult requested for sacrum wounds.  Wound type: Pt has multiple patchy areas of unstageable pressure injuries; entire affected area is approx 9X3cm Pressure Injury POA: Yes Wound bed: tightly adhered eschar/slough Drainage (amount, consistency, odor) mod amt tan drainage, some odor Periwound: intact skin surrounding and narrow intact areas of skin between areas of unstageable wounds Dressing procedure/placement/frequency: Topical treatment orders provided for bedside nurses to perform daily as follows to assist with enzymatic debridement of nonviable skin: Apply Santyl to sacrum wounds Q day, then cover with moist gauze and foam dressing.  (Change foam dressing Q 3 days or PRN soiling.) Please re-consult if further assistance is needed.  Thank-you,  Julien Girt MSN, [place], [place], [place], [place]

## 2019-06-16 NOTE — NC FL2 (Signed)
[place] LEVEL OF CARE SCREENING TOOL     IDENTIFICATION  Patient Name: Edward Strong Birthdate: 1929-08-17 Sex: male Admission Date (Current Location): 06/11/2019  [facility] and [place] Number:  Engineering geologist and Address:         Provider Number: [id number]  Attending Physician Name and Address:  Lorella Nimrod, MD  Relative Name and Phone Number:       Current Level of Care: Hospital Recommended Level of Care: [place] Prior Approval Number:    Date Approved/Denied:   PASRR Number: [id number]  Discharge Plan: SNF    Current Diagnoses: Patient Active Problem List   Diagnosis Date Noted  . Acute urinary retention 06/12/2019  . BPH with obstruction/lower urinary tract symptoms 06/12/2019  . UTI (urinary tract infection) 06/12/2019  . Hyperglycemia due to type 2 diabetes mellitus ([place]) 06/11/2019  . Metabolic acidosis 123456  . S/P ORIF (open reduction internal fixation) fracture 06/11/2019  . Hypotension 06/11/2019  . Acute metabolic encephalopathy 123456  . AKI (acute kidney injury) ([place]) 06/11/2019  . Postoperative anemia due to acute blood loss 05/16/2019  . Acute kidney injury superimposed on chronic kidney disease ([place])   . Closed fracture of left distal femur ([place]) 05/12/2019  . Chronic systolic CHF (congestive heart failure) ([place]) 05/12/2019  . Leukocytosis 05/12/2019  . Type II diabetes mellitus with renal manifestations ([place])   . HTN (hypertension)   . CKD (chronic kidney disease), stage IIIa   . Atrial fibrillation (HCC)     Orientation RESPIRATION BLADDER Height & Weight     Time, Self, Situation, Place  Normal Incontinent, Indwelling catheter Weight: 92.1 kg Height:  5\' 10"  (177.8 cm)  BEHAVIORAL SYMPTOMS/MOOD NEUROLOGICAL BOWEL NUTRITION STATUS      Continent Diet(heart healthy)  AMBULATORY STATUS COMMUNICATION OF NEEDS Skin   Extensive Assist   Normal, PU Stage and Appropriate Care                    Personal Care Assistance Level of Assistance              Functional Limitations Info             SPECIAL CARE FACTORS FREQUENCY  PT (By licensed PT), OT (By licensed OT)                    Contractures Contractures Info: Not present    Additional Factors Info  Code Status, Allergies Code Status Info: Full Allergies Info: Bee Venom, Ezetimibe, Fenofibrate Micronized, Pravastatin, Sulfa Antibiotics           Current Medications (06/16/2019):  This is the current hospital active medication list Current Facility-Administered Medications  Medication Dose Route Frequency Provider Last Rate Last Admin  . acetaminophen (TYLENOL) tablet 650 mg  650 mg Oral Q6H PRN Athena Masse, MD       Or  . acetaminophen (TYLENOL) suppository 650 mg  650 mg Rectal Q6H PRN Athena Masse, MD      . apixaban Arne Cleveland) tablet 2.5 mg  2.5 mg Oral BID Val Riles, MD   2.5 mg at 06/16/19 0902  . Chlorhexidine Gluconate Cloth 2 % PADS 6 each  6 each Topical Daily Val Riles, MD   6 each at 06/16/19 1014  . ciprofloxacin (CIPRO) tablet 500 mg  500 mg Oral Q breakfast Lorella Nimrod, MD   500 mg at 06/16/19 0902  . collagenase (SANTYL) ointment   Topical Daily Lorella Nimrod,  MD      . insulin aspart (novoLOG) injection 0-15 Units  0-15 Units Subcutaneous TID WC Lorella Nimrod, MD   8 Units at 06/16/19 1301  . insulin aspart (novoLOG) injection 0-5 Units  0-5 Units Subcutaneous QHS Lorella Nimrod, MD   2 Units at 06/15/19 2119  . insulin glargine (LANTUS) injection 10 Units  10 Units Subcutaneous QHS Lorella Nimrod, MD   10 Units at 06/15/19 2115  . ondansetron (ZOFRAN) tablet 4 mg  4 mg Oral Q6H PRN Athena Masse, MD       Or  . ondansetron [facility]) injection 4 mg  4 mg Intravenous Q6H PRN Athena Masse, MD   4 mg at 06/15/19 0908  . patiromer Daryll Drown) packet 16.8 g  16.8 g Oral Daily Judd Gaudier V, MD   16.8 g at 06/16/19 0904  . senna-docusate (Senokot-S) tablet 1 tablet   1 tablet Oral QHS PRN Athena Masse, MD   1 tablet at 06/14/19 2059     Discharge Medications: Please see discharge summary for a list of discharge medications.  Relevant Imaging Results:  Relevant Lab Results:   Additional Information SS# [id number]  Beverly Sessions, RN

## 2019-06-16 NOTE — TOC Transition Note (Signed)
Transition of Care [facility]) - CM/SW Discharge Note   Patient Details  Name: Edward Strong MRN: [id number] Date of Birth: [birth year]  Transition of Care [facility]) CM/SW Contact:  Beverly Sessions, RN Phone Number: 06/16/2019, 1:55 PM   Clinical Narrative:    Patient medically ready for discharge today [facility] unable to accept patient today  Bed offers presented to son. [facility] offer was accepted.  DC info sent in the hub.  [facility] notified  Repeat covid negative  EMS pack on chart.  Bedside RN notified    Final next level of care: Skilled Nursing Facility Barriers to Discharge: No Barriers Identified   Patient Goals and CMS Choice        Discharge Placement              Patient chooses bed at: [facility] Patient to be transferred to facility by: [facility] Name of family member notified: son ricky Patient and family notified of of transfer: 06/16/19  Discharge Plan and Services     Post Acute Care Choice: [facility]                               Social Determinants of Health (SDOH) Interventions     Readmission Risk Interventions No flowsheet data found.

## 2019-06-16 NOTE — Discharge Summary (Signed)
Physician Discharge Summary  Edward Strong [id number] DOB: November 18, 1929 DOA: 06/11/2019  PCP: Toni Arthurs, NP  Admit date: 06/11/2019 Discharge date: 06/16/2019  Admitted From: SNF Disposition: SNF  Recommendations for Outpatient Follow-up:  1. Follow up with PCP in 1-2 weeks 2. Follow-up with urology in 1 to 2 weeks. 3. Please obtain BMP/CBC in one week 4. Please follow up on the following pending results: None  Home Health: No Equipment/Devices: None Discharge Condition: Stable CODE STATUS: Full Diet recommendation: Heart Healthy / Carb Modified    Brief/Interim Summary: [name] a 86 y.o.malewith medical history significant forinsulin-dependent type 2 diabetes, CKD 3A, chronic A. fib on Eliquis, CAD, systolic heart failure, documented history of Hodgkin's lymphoma,recently underwent ORIF on 05/13/2019, postoperative course complicated by postoperative bleeding requiring 1 unit PRBC who also has a history severe urinary retention secondary to BPH who has been self-catheterizing four times a day until his recent discharge to rehab,who was sent to the emergency room by the nursing home where he was receiving rehab for abnormal labs.  Found to have AKI secondary to urinary retention and UTI, along with metabolic acidosis and hyperkalemia which has been resolved now. Creatinine peaked at 7.9 with baseline of 1.8.  Improved to 2.4 on discharge. Renal ultrasound: Bilateral simple renal cysts are noted, the largest measuring 10.5 cm on the left. No other renal abnormality is noted. Most likely due to urinary retention and UTI.  Patient is with Foley now. Urology will see him as an outpatient. -Encouraged p.o. hydration as patient is taking orally. -Monitor renal functions.  Urine culture with Enterobacter cloacae.  Which shows resistant to cefazolin and nitrofurantoin.  Sensitive to Cipro, gentamicin, meropenem and Zosyn.  He was treated with Cipro.  He will finish his 5-day  course and was given a prescription for 2 more days.  Patient also developed hypernatremia and hyperkalemia which was resolved with hypotonic saline and veltassa.  Encourage p.o. hydration.  His blood pressure remained within goal without Lasix or losartan which were held due to AKI.  Lasix was converted to as needed for edema or volume overload.  Keep holding losartan until his renal function normalized. His PCP should be able to restart if needed.  Patient did had some acute metabolic encephalopathy most likely secondary to dehydration and metabolic derangements.  He appears at baseline now.  He will continue his Eliquis for paroxysmal A. fib.  Rate control without any intervention.  Patient had sacral pressure injury.  He will need ongoing wound care.  Patient has recent ORIF.  We will continue with rehab.  No acute concern.  Discharge Diagnoses:  Principal Problem:   Acute kidney injury superimposed on chronic kidney disease ([place]) Active Problems:   HTN (hypertension)   CKD (chronic kidney disease), stage IIIa   Atrial fibrillation (HCC)   Chronic systolic CHF (congestive heart failure) (HCC)   Hyperglycemia due to type 2 diabetes mellitus (HCC)   Metabolic acidosis   S/P ORIF (open reduction internal fixation) fracture   Hypotension   Acute metabolic encephalopathy   AKI (acute kidney injury) ([name])   Acute urinary retention   BPH with obstruction/lower urinary tract symptoms   UTI (urinary tract infection)   Discharge Instructions  Discharge Instructions    Diet - low sodium heart healthy   Complete by: As directed    Increase activity slowly   Complete by: As directed      Allergies as of 06/16/2019      Reactions  Bee Venom Other (See Comments)   Ezetimibe Other (See Comments)   Fenofibrate Micronized Other (See Comments)   Pravastatin Other (See Comments)   Sulfa Antibiotics Other (See Comments)      Medication List    STOP taking these medications    losartan 100 MG tablet Commonly known as: COZAAR   oxyCODONE 5 MG immediate release tablet Commonly known as: Oxy IR/ROXICODONE     TAKE these medications   aspirin 81 MG chewable tablet Chew 81 mg by mouth daily.   bisacodyl 10 MG suppository Commonly known as: DULCOLAX Place 1 suppository (10 mg total) rectally daily as needed for moderate constipation.   ciprofloxacin 500 MG tablet Commonly known as: CIPRO Take 1 tablet (500 mg total) by mouth daily with breakfast for 2 days. Start taking on: June 17, 2019   Eliquis 2.5 MG Tabs tablet Generic drug: apixaban Take 2.5 mg by mouth 2 (two) times daily.   Ensure Max Protein Liqd Take 330 mLs (11 oz total) by mouth 2 (two) times daily between meals.   ferrous sulfate 325 (65 FE) MG tablet Take 1 tablet (325 mg total) by mouth 2 (two) times daily with a meal.   furosemide 40 MG tablet Commonly known as: LASIX Take 1 tablet (40 mg total) by mouth daily as needed for fluid or edema. What changed:   when to take this  reasons to take this   insulin NPH Human 100 UNIT/ML injection Commonly known as: NOVOLIN N Inject 0.2 mLs (20 Units total) into the skin every evening.   insulin regular 100 units/mL injection Commonly known as: NOVOLIN R Inject 0.05 mLs (5 Units total) into the skin 3 (three) times daily before meals.   polyethylene glycol 17 g packet Commonly known as: MIRALAX / GLYCOLAX Take 17 g by mouth daily.      Contact information for after-discharge care    Destination    [facility]erred [facility] .   Service: Skilled Nursing Contact information: [place] [phone]             Allergies  Allergen Reactions  . Bee Venom Other (See Comments)  . Ezetimibe Other (See Comments)  . Fenofibrate Micronized Other (See Comments)  . Pravastatin Other (See Comments)  . Sulfa Antibiotics Other (See Comments)     Consultations:    Procedures/Studies: US RENAL  Result Date: 06/12/2019 CLINICAL DATA:  Acute renal failure. EXAM: RENAL / URINARY TRACT ULTRASOUND COMPLETE COMPARISON:  None. FINDINGS: Right Kidney: Renal measurements: 11.6 x 5.3 x 6.4 cm = volume: 199 mL. At least 3 simple cysts are noted, the largest measuring 3.3 cm in lower pole. Echogenicity within normal limits. No mass or hydronephrosis visualized. Left Kidney: Renal measurements: 13.9 x 5.1 x 6.0 cm = volume: 223 mL. Several cysts are noted, the largest measuring 10.5 cm. Echogenicity within normal limits. No hydronephrosis visualized. Bladder: Decompressed secondary to Foley catheter. Other: None. IMPRESSION: Bilateral simple renal cysts are noted, the largest measuring 10.5 cm on the left. No other renal abnormality is noted. Electronically Signed   By: Marijo Conception M.D.   On: 06/12/2019 12:29   DG Chest Port 1 View  Result Date: 06/11/2019 CLINICAL DATA:  Weakness EXAM: PORTABLE CHEST 1 VIEW COMPARISON:  05/12/2019 FINDINGS: Cardiac shadow is enlarged but stable. Aortic calcifications are again seen. Calcified granuloma is again noted in the right mid lung. Postsurgical changes in the right lung are seen as well  with basilar scarring. Calcified hilar and mediastinal lymph nodes are seen. No bony abnormality is noted. IMPRESSION: Changes consistent with prior granulomatous disease. No acute bony abnormality is seen. Postsurgical change with chronic scarring in the right base. Electronically Signed   By: Inez Catalina M.D.   On: 06/11/2019 22:46   DG FEMUR MIN 2 VIEWS LEFT  Result Date: 06/13/2019 CLINICAL DATA:  Left femur ORIF 05/13/2019 EXAM: LEFT FEMUR 2 VIEWS COMPARISON:  None. FINDINGS: Comminuted distal femoral diametaphyseal fracture just above the distal femoral arthroplasty component transfixed with a lateral sideplate and multiple interlocking screws and a single cerclage wire. No hardware failure or complication. Fracture is  in near anatomic alignment. Generalized osteopenia. No other fracture or dislocation. Prior total knee arthroplasty in satisfactory position. Postsurgical changes in the surrounding soft tissues. Peripheral vascular atherosclerotic disease. IMPRESSION: Interval distal femoral diametaphyseal ORIF. Electronically Signed   By: Kathreen Devoid   On: 06/13/2019 09:49    Subjective: Was feeling better when seen this morning.  He was eating his breakfast.  No new complaints.  No nursing concerns overnight.  Discharge Exam: Vitals:   06/16/19 0459 06/16/19 1226  BP: 106/60 125/70  Pulse: 61 61  Resp: 18 18  Temp: 97.9 F (36.6 C) 97.6 F (36.4 C)  SpO2: 94% 100%   Vitals:   06/15/19 1350 06/15/19 2029 06/16/19 0459 06/16/19 1226  BP: 112/64 115/73 106/60 125/70  Pulse: 66 64 61 61  Resp: 16 18 18 18   Temp: 98 F (36.7 C) (!) 97.4 F (36.3 C) 97.9 F (36.6 C) 97.6 F (36.4 C)  TempSrc: Oral Oral Oral Oral  SpO2: 98% 98% 94% 100%  Weight:      Height:        General: Pt is alert, awake, not in acute distress Cardiovascular: RRR, S1/S2 +, no rubs, no gallops Respiratory: CTA bilaterally, no wheezing, no rhonchi Abdominal: Soft, NT, ND, bowel sounds + Extremities: no edema, no cyanosis   The results of significant diagnostics from this hospitalization (including imaging, microbiology, ancillary and laboratory) are listed below for reference.    Microbiology: Recent Results (from the past 240 hour(s))  Respiratory Panel by RT PCR (Flu A&B, Covid) - Nasopharyngeal Swab     Status: None   Collection Time: 06/11/19  6:27 PM   Specimen: Nasopharyngeal Swab  Result Value Ref Range Status   SARS Coronavirus 2 by RT PCR NEGATIVE NEGATIVE Final    Comment: (NOTE) SARS-CoV-2 target nucleic acids are NOT DETECTED. The SARS-CoV-2 RNA is generally detectable in upper respiratoy specimens during the acute phase of infection. The lowest concentration of SARS-CoV-2 viral copies this assay can  detect is 131 copies/mL. A negative result does not preclude SARS-Cov-2 infection and should not be used as the sole basis for treatment or other patient management decisions. A negative result may occur with  improper specimen collection/handling, submission of specimen other than nasopharyngeal swab, presence of viral mutation(s) within the areas targeted by this assay, and inadequate number of viral copies (<131 copies/mL). A negative result must be combined with clinical observations, patient history, and epidemiological information. The expected result is Negative. Fact Sheet for Patients:  PinkCheek.be Fact Sheet for Healthcare Providers:  GravelBags.it This test is not yet ap proved or cleared by the [place] FDA and  has been authorized for detection and/or diagnosis of SARS-CoV-2 by FDA under an Emergency Use Authorization (EUA). This EUA will remain  in effect (meaning this test can be used) for the  duration of the COVID-19 declaration under Section 564(b)(1) of the Act, 21 U.S.C. section 360bbb-3(b)(1), unless the authorization is terminated or revoked sooner.    Influenza A by PCR NEGATIVE NEGATIVE Final   Influenza B by PCR NEGATIVE NEGATIVE Final    Comment: (NOTE) The Xpert Xpress SARS-CoV-2/FLU/RSV assay is intended as an aid in  the diagnosis of influenza from Nasopharyngeal swab specimens and  should not be used as a sole basis for treatment. Nasal washings and  aspirates are unacceptable for Xpert Xpress SARS-CoV-2/FLU/RSV  testing. Fact Sheet for Patients: PinkCheek.be Fact Sheet for Healthcare Providers: GravelBags.it This test is not yet approved or cleared by the [place] FDA and  has been authorized for detection and/or diagnosis of SARS-CoV-2 by  FDA under an Emergency Use Authorization (EUA). This EUA will remain  in effect (meaning  this test can be used) for the duration of the  Covid-19 declaration under Section 564(b)(1) of the Act, 21  U.S.C. section 360bbb-3(b)(1), unless the authorization is  terminated or revoked. Performed at [facility], [address] [place], [place] [address]   Urine Culture     Status: Abnormal   Collection Time: 06/11/19 10:02 PM   Specimen: Urine, Random  Result Value Ref Range Status   Specimen Description   Final    URINE, RANDOM Performed at [facility], [address] [place], [place] [address]    Special Requests   Final    NONE Performed at [facility], [address] [place], [place] [address]    Culture 70,000 COLONIES/mL ENTEROBACTER CLOACAE (A)  Final   Report Status 06/14/2019 FINAL  Final   Organism ID, Bacteria ENTEROBACTER CLOACAE (A)  Final      Susceptibility   Enterobacter cloacae - MIC*    CEFAZOLIN >=64 RESISTANT Resistant     CIPROFLOXACIN <=0.25 SENSITIVE Sensitive     GENTAMICIN <=1 SENSITIVE Sensitive     IMIPENEM <=0.25 SENSITIVE Sensitive     NITROFURANTOIN 128 RESISTANT Resistant     TRIMETH/SULFA <=20 SENSITIVE Sensitive     PIP/TAZO 16 SENSITIVE Sensitive     * 70,000 COLONIES/mL ENTEROBACTER CLOACAE  Respiratory Panel by RT PCR (Flu A&B, Covid) - Nasopharyngeal Swab     Status: None   Collection Time: 06/16/19 10:45 AM   Specimen: Nasopharyngeal Swab  Result Value Ref Range Status   SARS Coronavirus 2 by RT PCR NEGATIVE NEGATIVE Final    Comment: (NOTE) SARS-CoV-2 target nucleic acids are NOT DETECTED. The SARS-CoV-2 RNA is generally detectable in upper respiratoy specimens during the acute phase of infection. The lowest concentration of SARS-CoV-2 viral copies this assay can detect is 131 copies/mL. A negative result does not preclude SARS-Cov-2 infection and should not be used as the sole basis for treatment or other patient management decisions. A negative result may occur with  improper specimen  collection/handling, submission of specimen other than nasopharyngeal swab, presence of viral mutation(s) within the areas targeted by this assay, and inadequate number of viral copies (<131 copies/mL). A negative result must be combined with clinical observations, patient history, and epidemiological information. The expected result is Negative. Fact Sheet for Patients:  PinkCheek.be Fact Sheet for Healthcare Providers:  GravelBags.it This test is not yet ap proved or cleared by the [place] FDA and  has been authorized for detection and/or diagnosis of SARS-CoV-2 by FDA under an Emergency Use Authorization (EUA). This EUA will remain  in effect (meaning this test can be used) for the  duration of the COVID-19 declaration under Section 564(b)(1) of the Act, 21 U.S.C. section 360bbb-3(b)(1), unless the authorization is terminated or revoked sooner.    Influenza A by PCR NEGATIVE NEGATIVE Final   Influenza B by PCR NEGATIVE NEGATIVE Final    Comment: (NOTE) The Xpert Xpress SARS-CoV-2/FLU/RSV assay is intended as an aid in  the diagnosis of influenza from Nasopharyngeal swab specimens and  should not be used as a sole basis for treatment. Nasal washings and  aspirates are unacceptable for Xpert Xpress SARS-CoV-2/FLU/RSV  testing. Fact Sheet for Patients: PinkCheek.be Fact Sheet for Healthcare Providers: GravelBags.it This test is not yet approved or cleared by the [place] FDA and  has been authorized for detection and/or diagnosis of SARS-CoV-2 by  FDA under an Emergency Use Authorization (EUA). This EUA will remain  in effect (meaning this test can be used) for the duration of the  Covid-19 declaration under Section 564(b)(1) of the Act, 21  U.S.C. section 360bbb-3(b)(1), unless the authorization is  terminated or revoked. Performed at [facility],  [address] [place], [place] [address]      Labs: BNP (last 3 results) Recent Labs    05/12/19 1230 06/11/19 1854  BNP 107.0* [id number]*   Basic Metabolic Panel: Recent Labs  Lab 06/12/19 0351 06/13/19 0408 06/14/19 0624 06/15/19 0644 06/16/19 0500  NA 141 144 148* 150* 145  K 5.3* 4.3 3.8 3.8 4.3  CL 115* 116* 114* 116* 115*  CO2 14* 20* 26 24 23   GLUCOSE 216* 251* 252* 167* 160*  BUN 159* 142* 95* 80* 71*  CREATININE 6.52* 4.76* 3.07* 2.70* 2.47*  CALCIUM 8.7* 8.6* 8.4* 8.6* 8.3*  MG  --  2.4  --   --   --   PHOS  --  4.3  --   --  3.2   Liver Function Tests: Recent Labs  Lab 06/11/19 1827 06/12/19 0351 06/16/19 0500  AST 48* 28  --   ALT 58* 46*  --   ALKPHOS 200* 168*  --   BILITOT 0.9 0.9  --   PROT 7.0 6.1*  --   ALBUMIN 2.6* 2.3* 2.2*   No results for input(s): LIPASE, AMYLASE in the last 168 hours. No results for input(s): AMMONIA in the last 168 hours. CBC: Recent Labs  Lab 06/11/19 1827 06/12/19 0351 06/13/19 0408 06/14/19 0624 06/15/19 0644  WBC 10.0 8.8 10.0 9.4 9.9  HGB 12.1* 10.8* 10.2* 10.4* 11.0*  HCT 37.1* 33.7* 30.6* 31.3* 34.0*  MCV 103.1* 104.3* 102.3* 102.6* 104.0*  PLT 208 187 184 176 184   Cardiac Enzymes: No results for input(s): CKTOTAL, CKMB, CKMBINDEX, TROPONINI in the last 168 hours. BNP: Invalid input(s): POCBNP CBG: Recent Labs  Lab 06/15/19 0756 06/15/19 1157 06/15/19 1632 06/15/19 2104 06/16/19 0741  GLUCAP 166* 209* 172* 218* 169*   D-Dimer No results for input(s): DDIMER in the last 72 hours. Hgb A1c No results for input(s): HGBA1C in the last 72 hours. Lipid Profile No results for input(s): CHOL, HDL, LDLCALC, TRIG, CHOLHDL, LDLDIRECT in the last 72 hours. Thyroid function studies No results for input(s): TSH, T4TOTAL, T3FREE, THYROIDAB in the last 72 hours.  Invalid input(s): FREET3 Anemia work up No results for input(s): VITAMINB12, FOLATE, FERRITIN, TIBC, IRON, RETICCTPCT in the last 72  hours. Urinalysis    Component Value Date/Time   COLORURINE YELLOW (A) 06/11/2019 2202   APPEARANCEUR TURBID (A) 06/11/2019 2202   LABSPEC 1.011 06/11/2019 2202   PHURINE 5.0 06/11/2019 2202  GLUCOSEU NEGATIVE 06/11/2019 2202   HGBUR MODERATE (A) 06/11/2019 2202   BILIRUBINUR NEGATIVE 06/11/2019 2202   KETONESUR NEGATIVE 06/11/2019 2202   PROTEINUR 100 (A) 06/11/2019 2202   NITRITE NEGATIVE 06/11/2019 2202   LEUKOCYTESUR LARGE (A) 06/11/2019 2202   Sepsis Labs Invalid input(s): PROCALCITONIN,  WBC,  LACTICIDVEN Microbiology Recent Results (from the past 240 hour(s))  Respiratory Panel by RT PCR (Flu A&B, Covid) - Nasopharyngeal Swab     Status: None   Collection Time: 06/11/19  6:27 PM   Specimen: Nasopharyngeal Swab  Result Value Ref Range Status   SARS Coronavirus 2 by RT PCR NEGATIVE NEGATIVE Final    Comment: (NOTE) SARS-CoV-2 target nucleic acids are NOT DETECTED. The SARS-CoV-2 RNA is generally detectable in upper respiratoy specimens during the acute phase of infection. The lowest concentration of SARS-CoV-2 viral copies this assay can detect is 131 copies/mL. A negative result does not preclude SARS-Cov-2 infection and should not be used as the sole basis for treatment or other patient management decisions. A negative result may occur with  improper specimen collection/handling, submission of specimen other than nasopharyngeal swab, presence of viral mutation(s) within the areas targeted by this assay, and inadequate number of viral copies (<131 copies/mL). A negative result must be combined with clinical observations, patient history, and epidemiological information. The expected result is Negative. Fact Sheet for Patients:  PinkCheek.be Fact Sheet for Healthcare Providers:  GravelBags.it This test is not yet ap proved or cleared by the [place] FDA and  has been authorized for detection and/or  diagnosis of SARS-CoV-2 by FDA under an Emergency Use Authorization (EUA). This EUA will remain  in effect (meaning this test can be used) for the duration of the COVID-19 declaration under Section 564(b)(1) of the Act, 21 U.S.C. section 360bbb-3(b)(1), unless the authorization is terminated or revoked sooner.    Influenza A by PCR NEGATIVE NEGATIVE Final   Influenza B by PCR NEGATIVE NEGATIVE Final    Comment: (NOTE) The Xpert Xpress SARS-CoV-2/FLU/RSV assay is intended as an aid in  the diagnosis of influenza from Nasopharyngeal swab specimens and  should not be used as a sole basis for treatment. Nasal washings and  aspirates are unacceptable for Xpert Xpress SARS-CoV-2/FLU/RSV  testing. Fact Sheet for Patients: PinkCheek.be Fact Sheet for Healthcare Providers: GravelBags.it This test is not yet approved or cleared by the [place] FDA and  has been authorized for detection and/or diagnosis of SARS-CoV-2 by  FDA under an Emergency Use Authorization (EUA). This EUA will remain  in effect (meaning this test can be used) for the duration of the  Covid-19 declaration under Section 564(b)(1) of the Act, 21  U.S.C. section 360bbb-3(b)(1), unless the authorization is  terminated or revoked. Performed at [facility], [address], [place], [place] [address]   Urine Culture     Status: Abnormal   Collection Time: 06/11/19 10:02 PM   Specimen: Urine, Random  Result Value Ref Range Status   Specimen Description   Final    URINE, RANDOM Performed at [facility], [address] [place], [place] [address]    Special Requests   Final    NONE Performed at [facility], [address] [place], [place] [address]    Culture 70,000 COLONIES/mL ENTEROBACTER CLOACAE (A)  Final   Report Status 06/14/2019 FINAL  Final   Organism ID, Bacteria ENTEROBACTER CLOACAE (A)  Final      Susceptibility    Enterobacter cloacae - MIC*  CEFAZOLIN >=64 RESISTANT Resistant     CIPROFLOXACIN <=0.25 SENSITIVE Sensitive     GENTAMICIN <=1 SENSITIVE Sensitive     IMIPENEM <=0.25 SENSITIVE Sensitive     NITROFURANTOIN 128 RESISTANT Resistant     TRIMETH/SULFA <=20 SENSITIVE Sensitive     PIP/TAZO 16 SENSITIVE Sensitive     * 70,000 COLONIES/mL ENTEROBACTER CLOACAE  Respiratory Panel by RT PCR (Flu A&B, Covid) - Nasopharyngeal Swab     Status: None   Collection Time: 06/16/19 10:45 AM   Specimen: Nasopharyngeal Swab  Result Value Ref Range Status   SARS Coronavirus 2 by RT PCR NEGATIVE NEGATIVE Final    Comment: (NOTE) SARS-CoV-2 target nucleic acids are NOT DETECTED. The SARS-CoV-2 RNA is generally detectable in upper respiratoy specimens during the acute phase of infection. The lowest concentration of SARS-CoV-2 viral copies this assay can detect is 131 copies/mL. A negative result does not preclude SARS-Cov-2 infection and should not be used as the sole basis for treatment or other patient management decisions. A negative result may occur with  improper specimen collection/handling, submission of specimen other than nasopharyngeal swab, presence of viral mutation(s) within the areas targeted by this assay, and inadequate number of viral copies (<131 copies/mL). A negative result must be combined with clinical observations, patient history, and epidemiological information. The expected result is Negative. Fact Sheet for Patients:  PinkCheek.be Fact Sheet for Healthcare Providers:  GravelBags.it This test is not yet ap proved or cleared by the [place] FDA and  has been authorized for detection and/or diagnosis of SARS-CoV-2 by FDA under an Emergency Use Authorization (EUA). This EUA will remain  in effect (meaning this test can be used) for the duration of the COVID-19 declaration under Section 564(b)(1) of the Act, 21  U.S.C. section 360bbb-3(b)(1), unless the authorization is terminated or revoked sooner.    Influenza A by PCR NEGATIVE NEGATIVE Final   Influenza B by PCR NEGATIVE NEGATIVE Final    Comment: (NOTE) The Xpert Xpress SARS-CoV-2/FLU/RSV assay is intended as an aid in  the diagnosis of influenza from Nasopharyngeal swab specimens and  should not be used as a sole basis for treatment. Nasal washings and  aspirates are unacceptable for Xpert Xpress SARS-CoV-2/FLU/RSV  testing. Fact Sheet for Patients: PinkCheek.be Fact Sheet for Healthcare Providers: GravelBags.it This test is not yet approved or cleared by the [place] FDA and  has been authorized for detection and/or diagnosis of SARS-CoV-2 by  FDA under an Emergency Use Authorization (EUA). This EUA will remain  in effect (meaning this test can be used) for the duration of the  Covid-19 declaration under Section 564(b)(1) of the Act, 21  U.S.C. section 360bbb-3(b)(1), unless the authorization is  terminated or revoked. Performed at [facility], [address] [place], [place] [address]     Time coordinating discharge: Over 30 minutes  SIGNED:  Lorella Nimrod, MD  [facility] 06/16/2019, 12:28 PM  If 7PM-7AM, please contact night-coverage [email or web address]  This record has been created using Systems analyst. Errors have been sought and corrected,but may not always be located. Such creation errors do not reflect on the standard of care.

## 2019-06-16 NOTE — Progress Notes (Signed)
Report called to [place] at [employer].  EMS is here to transport the patient. I called the patient's son, [name], and informed him that EMS had just left with the patient. IV removed. Belongings sent with the patient. VSS

## 2019-06-16 NOTE — TOC Progression Note (Signed)
Transition of Care [facility]) - Progression Note    Patient Details  Name: Edward Strong MRN: [id number] Date of Birth: April 08, 1930  [facility]) CM/SW Contact  Beverly Sessions, RN Phone Number: 06/16/2019, 9:32 AM  Clinical Narrative:    Voice mail left for admissions coordinator at [facility] in [place]    Expected Discharge Plan: [place] Barriers to Discharge: Continued Medical Work up  Expected Discharge Plan and Services Expected Discharge Plan: [place] Choice: [place] arrangements for the past 2 months: [place], Single Family Home                                       Social Determinants of Health (SDOH) Interventions    Readmission Risk Interventions No flowsheet data found.

## 2019-07-02 ENCOUNTER — Inpatient Hospital Stay
Admission: EM | Admit: 2019-07-02 | Discharge: 2019-07-11 | DRG: 853 | Disposition: A | Discharge status: Hospice | Payer: Medicare Other | Source: Skilled Nursing Facility | Attending: Internal Medicine | Admitting: Internal Medicine

## 2019-07-02 ENCOUNTER — Emergency Department: Payer: Medicare Other

## 2019-07-02 ENCOUNTER — Other Ambulatory Visit: Payer: Self-pay

## 2019-07-02 ENCOUNTER — Encounter: Payer: Medicare Other | Attending: Internal Medicine | Admitting: Internal Medicine

## 2019-07-02 DIAGNOSIS — I482 Chronic atrial fibrillation, unspecified: Secondary | ICD-10-CM | POA: Diagnosis present

## 2019-07-02 DIAGNOSIS — E43 Unspecified severe protein-calorie malnutrition: Secondary | ICD-10-CM | POA: Diagnosis present

## 2019-07-02 DIAGNOSIS — Z7982 Long term (current) use of aspirin: Secondary | ICD-10-CM

## 2019-07-02 DIAGNOSIS — R6520 Severe sepsis without septic shock: Secondary | ICD-10-CM | POA: Diagnosis not present

## 2019-07-02 DIAGNOSIS — I5022 Chronic systolic (congestive) heart failure: Secondary | ICD-10-CM | POA: Diagnosis present

## 2019-07-02 DIAGNOSIS — Z9049 Acquired absence of other specified parts of digestive tract: Secondary | ICD-10-CM

## 2019-07-02 DIAGNOSIS — Z7189 Other specified counseling: Secondary | ICD-10-CM

## 2019-07-02 DIAGNOSIS — R339 Retention of urine, unspecified: Secondary | ICD-10-CM | POA: Diagnosis present

## 2019-07-02 DIAGNOSIS — J984 Other disorders of lung: Secondary | ICD-10-CM

## 2019-07-02 DIAGNOSIS — E872 Acidosis: Secondary | ICD-10-CM | POA: Diagnosis present

## 2019-07-02 DIAGNOSIS — I4891 Unspecified atrial fibrillation: Secondary | ICD-10-CM | POA: Insufficient documentation

## 2019-07-02 DIAGNOSIS — Z7901 Long term (current) use of anticoagulants: Secondary | ICD-10-CM | POA: Diagnosis not present

## 2019-07-02 DIAGNOSIS — I130 Hypertensive heart and chronic kidney disease with heart failure and stage 1 through stage 4 chronic kidney disease, or unspecified chronic kidney disease: Secondary | ICD-10-CM | POA: Diagnosis present

## 2019-07-02 DIAGNOSIS — R6521 Severe sepsis with septic shock: Secondary | ICD-10-CM | POA: Diagnosis present

## 2019-07-02 DIAGNOSIS — D631 Anemia in chronic kidney disease: Secondary | ICD-10-CM | POA: Diagnosis present

## 2019-07-02 DIAGNOSIS — M6284 Sarcopenia: Secondary | ICD-10-CM | POA: Diagnosis present

## 2019-07-02 DIAGNOSIS — Z888 Allergy status to other drugs, medicaments and biological substances status: Secondary | ICD-10-CM | POA: Insufficient documentation

## 2019-07-02 DIAGNOSIS — I5020 Unspecified systolic (congestive) heart failure: Secondary | ICD-10-CM | POA: Insufficient documentation

## 2019-07-02 DIAGNOSIS — Z87891 Personal history of nicotine dependence: Secondary | ICD-10-CM | POA: Diagnosis not present

## 2019-07-02 DIAGNOSIS — E1122 Type 2 diabetes mellitus with diabetic chronic kidney disease: Secondary | ICD-10-CM | POA: Diagnosis present

## 2019-07-02 DIAGNOSIS — Z66 Do not resuscitate: Secondary | ICD-10-CM | POA: Diagnosis present

## 2019-07-02 DIAGNOSIS — N186 End stage renal disease: Secondary | ICD-10-CM | POA: Insufficient documentation

## 2019-07-02 DIAGNOSIS — E1121 Type 2 diabetes mellitus with diabetic nephropathy: Secondary | ICD-10-CM

## 2019-07-02 DIAGNOSIS — Z8571 Personal history of Hodgkin lymphoma: Secondary | ICD-10-CM

## 2019-07-02 DIAGNOSIS — N401 Enlarged prostate with lower urinary tract symptoms: Secondary | ICD-10-CM | POA: Diagnosis present

## 2019-07-02 DIAGNOSIS — L98423 Non-pressure chronic ulcer of back with necrosis of muscle: Secondary | ICD-10-CM

## 2019-07-02 DIAGNOSIS — I82409 Acute embolism and thrombosis of unspecified deep veins of unspecified lower extremity: Secondary | ICD-10-CM

## 2019-07-02 DIAGNOSIS — I132 Hypertensive heart and chronic kidney disease with heart failure and with stage 5 chronic kidney disease, or end stage renal disease: Secondary | ICD-10-CM | POA: Insufficient documentation

## 2019-07-02 DIAGNOSIS — E440 Moderate protein-calorie malnutrition: Secondary | ICD-10-CM | POA: Diagnosis not present

## 2019-07-02 DIAGNOSIS — Z20822 Contact with and (suspected) exposure to covid-19: Secondary | ICD-10-CM | POA: Diagnosis present

## 2019-07-02 DIAGNOSIS — E1169 Type 2 diabetes mellitus with other specified complication: Secondary | ICD-10-CM | POA: Diagnosis present

## 2019-07-02 DIAGNOSIS — I2510 Atherosclerotic heart disease of native coronary artery without angina pectoris: Secondary | ICD-10-CM | POA: Insufficient documentation

## 2019-07-02 DIAGNOSIS — I5042 Chronic combined systolic (congestive) and diastolic (congestive) heart failure: Secondary | ICD-10-CM | POA: Diagnosis present

## 2019-07-02 DIAGNOSIS — Z8249 Family history of ischemic heart disease and other diseases of the circulatory system: Secondary | ICD-10-CM | POA: Insufficient documentation

## 2019-07-02 DIAGNOSIS — N1831 Chronic kidney disease, stage 3a: Secondary | ICD-10-CM | POA: Diagnosis not present

## 2019-07-02 DIAGNOSIS — Z882 Allergy status to sulfonamides status: Secondary | ICD-10-CM | POA: Insufficient documentation

## 2019-07-02 DIAGNOSIS — A419 Sepsis, unspecified organism: Principal | ICD-10-CM | POA: Diagnosis present

## 2019-07-02 DIAGNOSIS — E11622 Type 2 diabetes mellitus with other skin ulcer: Secondary | ICD-10-CM | POA: Insufficient documentation

## 2019-07-02 DIAGNOSIS — Z79899 Other long term (current) drug therapy: Secondary | ICD-10-CM | POA: Diagnosis not present

## 2019-07-02 DIAGNOSIS — F0390 Unspecified dementia without behavioral disturbance: Secondary | ICD-10-CM | POA: Diagnosis present

## 2019-07-02 DIAGNOSIS — L89154 Pressure ulcer of sacral region, stage 4: Secondary | ICD-10-CM | POA: Diagnosis present

## 2019-07-02 DIAGNOSIS — N179 Acute kidney failure, unspecified: Secondary | ICD-10-CM | POA: Diagnosis present

## 2019-07-02 DIAGNOSIS — M869 Osteomyelitis, unspecified: Secondary | ICD-10-CM | POA: Diagnosis present

## 2019-07-02 DIAGNOSIS — I5031 Acute diastolic (congestive) heart failure: Secondary | ICD-10-CM | POA: Diagnosis not present

## 2019-07-02 DIAGNOSIS — R001 Bradycardia, unspecified: Secondary | ICD-10-CM | POA: Diagnosis not present

## 2019-07-02 DIAGNOSIS — E1129 Type 2 diabetes mellitus with other diabetic kidney complication: Secondary | ICD-10-CM | POA: Diagnosis present

## 2019-07-02 DIAGNOSIS — Z794 Long term (current) use of insulin: Secondary | ICD-10-CM

## 2019-07-02 DIAGNOSIS — N183 Chronic kidney disease, stage 3 unspecified: Secondary | ICD-10-CM | POA: Diagnosis present

## 2019-07-02 DIAGNOSIS — Z515 Encounter for palliative care: Secondary | ICD-10-CM | POA: Diagnosis not present

## 2019-07-02 DIAGNOSIS — M7989 Other specified soft tissue disorders: Secondary | ICD-10-CM

## 2019-07-02 DIAGNOSIS — L98429 Non-pressure chronic ulcer of back with unspecified severity: Secondary | ICD-10-CM | POA: Diagnosis present

## 2019-07-02 DIAGNOSIS — Z886 Allergy status to analgesic agent status: Secondary | ICD-10-CM | POA: Insufficient documentation

## 2019-07-02 DIAGNOSIS — N184 Chronic kidney disease, stage 4 (severe): Secondary | ICD-10-CM

## 2019-07-02 DIAGNOSIS — Z96652 Presence of left artificial knee joint: Secondary | ICD-10-CM | POA: Diagnosis present

## 2019-07-02 LAB — URINALYSIS, ROUTINE W REFLEX MICROSCOPIC
Bacteria, UA: NONE SEEN
Bilirubin Urine: NEGATIVE
Glucose, UA: 150 mg/dL — AB
Hgb urine dipstick: NEGATIVE
Ketones, ur: NEGATIVE mg/dL
Nitrite: NEGATIVE
Protein, ur: 30 mg/dL — AB
Specific Gravity, Urine: 1.018 (ref 1.005–1.030)
pH: 5 (ref 5.0–8.0)

## 2019-07-02 LAB — CBC WITH DIFFERENTIAL/PLATELET
Abs Immature Granulocytes: 0.2 10*3/uL — ABNORMAL HIGH (ref 0.00–0.07)
Basophils Absolute: 0.1 10*3/uL (ref 0.0–0.1)
Basophils Relative: 1 %
Eosinophils Absolute: 0 10*3/uL (ref 0.0–0.5)
Eosinophils Relative: 0 %
HCT: 34.6 % — ABNORMAL LOW (ref 39.0–52.0)
Hemoglobin: 10.9 g/dL — ABNORMAL LOW (ref 13.0–17.0)
Immature Granulocytes: 2 %
Lymphocytes Relative: 12 %
Lymphs Abs: 1.6 10*3/uL (ref 0.7–4.0)
MCH: 33.3 pg (ref 26.0–34.0)
MCHC: 31.5 g/dL (ref 30.0–36.0)
MCV: 105.8 fL — ABNORMAL HIGH (ref 80.0–100.0)
Monocytes Absolute: 1.1 10*3/uL — ABNORMAL HIGH (ref 0.1–1.0)
Monocytes Relative: 8 %
Neutro Abs: 10.8 10*3/uL — ABNORMAL HIGH (ref 1.7–7.7)
Neutrophils Relative %: 77 %
Platelets: 333 10*3/uL (ref 150–400)
RBC: 3.27 MIL/uL — ABNORMAL LOW (ref 4.22–5.81)
RDW: 14.6 % (ref 11.5–15.5)
WBC: 13.8 10*3/uL — ABNORMAL HIGH (ref 4.0–10.5)
nRBC: 0 % (ref 0.0–0.2)

## 2019-07-02 LAB — COMPREHENSIVE METABOLIC PANEL
ALT: 37 U/L (ref 0–44)
AST: 41 U/L (ref 15–41)
Albumin: 2.2 g/dL — ABNORMAL LOW (ref 3.5–5.0)
Alkaline Phosphatase: 190 U/L — ABNORMAL HIGH (ref 38–126)
Anion gap: 11 (ref 5–15)
BUN: 74 mg/dL — ABNORMAL HIGH (ref 8–23)
CO2: 15 mmol/L — ABNORMAL LOW (ref 22–32)
Calcium: 8.5 mg/dL — ABNORMAL LOW (ref 8.9–10.3)
Chloride: 117 mmol/L — ABNORMAL HIGH (ref 98–111)
Creatinine, Ser: 3.13 mg/dL — ABNORMAL HIGH (ref 0.61–1.24)
GFR calc Af Amer: 20 mL/min — ABNORMAL LOW (ref >60)
GFR calc non Af Amer: 17 mL/min — ABNORMAL LOW (ref >60)
Glucose, Bld: 102 mg/dL — ABNORMAL HIGH (ref 70–99)
Potassium: 4.6 mmol/L (ref 3.5–5.1)
Sodium: 143 mmol/L (ref 135–145)
Total Bilirubin: 0.7 mg/dL (ref 0.3–1.2)
Total Protein: 6.6 g/dL (ref 6.5–8.1)

## 2019-07-02 LAB — LACTIC ACID, PLASMA
Lactic Acid, Venous: 3.2 mmol/L (ref 0.5–1.9)
Lactic Acid, Venous: 2.1 mmol/L (ref 0.5–1.9)

## 2019-07-02 LAB — SEDIMENTATION RATE: Sed Rate: 84 mm/hr — ABNORMAL HIGH (ref 0–20)

## 2019-07-02 LAB — GLUCOSE, CAPILLARY
Glucose-Capillary: 42 mg/dL — CL (ref 70–99)
Glucose-Capillary: 111 mg/dL — ABNORMAL HIGH (ref 70–99)
Glucose-Capillary: 141 mg/dL — ABNORMAL HIGH (ref 70–99)

## 2019-07-02 LAB — RESPIRATORY PANEL BY RT PCR (FLU A&B, COVID)
Influenza A by PCR: NEGATIVE
Influenza B by PCR: NEGATIVE
SARS Coronavirus 2 by RT PCR: NEGATIVE

## 2019-07-02 LAB — C-REACTIVE PROTEIN: CRP: 9.6 mg/dL — ABNORMAL HIGH (ref <1.0)

## 2019-07-02 MED ORDER — LACTATED RINGERS IV BOLUS
1000.0000 mL | Freq: Once | INTRAVENOUS | Status: AC
  Administered 2019-07-02: 1000 mL via INTRAVENOUS

## 2019-07-02 MED ORDER — SODIUM CHLORIDE 0.9 % IV SOLN
2.0000 g | Freq: Once | INTRAVENOUS | Status: AC
  Administered 2019-07-02: 2 g via INTRAVENOUS
  Filled 2019-07-02: qty 2

## 2019-07-02 MED ORDER — METRONIDAZOLE IN NACL 5-0.79 MG/ML-% IV SOLN
500.0000 mg | Freq: Three times a day (TID) | INTRAVENOUS | Status: DC
  Administered 2019-07-02 – 2019-07-08 (×17): 500 mg via INTRAVENOUS
  Filled 2019-07-02 (×21): qty 100

## 2019-07-02 MED ORDER — SODIUM CHLORIDE 0.9 % IV BOLUS
1000.0000 mL | Freq: Once | INTRAVENOUS | Status: AC
  Administered 2019-07-02: 1000 mL via INTRAVENOUS

## 2019-07-02 MED ORDER — DEXTROSE 50 % IV SOLN
INTRAVENOUS | Status: AC
  Administered 2019-07-02: 50 mL
  Filled 2019-07-02: qty 50

## 2019-07-02 MED ORDER — NOREPINEPHRINE 4 MG/250ML-% IV SOLN
INTRAVENOUS | Status: AC
  Administered 2019-07-02: 10 ug/min via INTRAVENOUS
  Filled 2019-07-02: qty 250

## 2019-07-02 MED ORDER — VANCOMYCIN HCL 2000 MG/400ML IV SOLN
2000.0000 mg | Freq: Once | INTRAVENOUS | Status: AC
  Administered 2019-07-02: 2000 mg via INTRAVENOUS
  Filled 2019-07-02: qty 400

## 2019-07-02 MED ORDER — NOREPINEPHRINE 4 MG/250ML-% IV SOLN
2.0000 ug/min | INTRAVENOUS | Status: DC
  Administered 2019-07-03: 11 ug/min via INTRAVENOUS
  Administered 2019-07-03: 13 ug/min via INTRAVENOUS
  Administered 2019-07-03: 14 ug/min via INTRAVENOUS
  Administered 2019-07-04: 10 ug/min via INTRAVENOUS
  Administered 2019-07-04: 13 ug/min via INTRAVENOUS
  Administered 2019-07-04: 8 ug/min via INTRAVENOUS
  Administered 2019-07-04: 9 ug/min via INTRAVENOUS
  Filled 2019-07-02 (×8): qty 250

## 2019-07-02 NOTE — Consult Note (Signed)
CODE SEPSIS - PHARMACY COMMUNICATION  **Broad Spectrum Antibiotics should be administered within 1 hour of Sepsis diagnosis**  Time Code Sepsis Called/Page Received: 1746   Antibiotics Ordered: cefepime and vancomycin  Time of 1st antibiotic administration: [place] ,PharmD Clinical Pharmacist  07/02/2019  5:46 PM

## 2019-07-02 NOTE — ED Provider Notes (Addendum)
[facility] Emergency Department Provider Note  ____________________________________________   First MD Initiated Contact with Patient 07/02/19 1622     (approximate)  I have reviewed the triage vital signs and the nursing notes.   HISTORY  Chief Complaint Wound Infection    HPI Edward Strong is a 86 y.o. male  With h/o AFib, HTN, CKD, dementia, here from wound center for evaluation. Pt has had an unfortunate recent history of fall, femur fx requiring surgery, AKI, and SNF placement. Pt has subsequently developed a sacral ulcer, and over the past several days has had increasing foul smell, drainage, redness from the wound. He had a new appt at the wound center today and was sent here for likely need for operative debridement. Per son's report, pt has had some mild confusion but o/w seems like himself. He denies any overt pain on my questioning but son says he has been moving a lot and seems uncomfortable when sitting. Denies any increased pain in his leg or foot. No numbness, weakness.       Past Medical History:  Diagnosis Date  . Atrial fibrillation ([place])   . CKD (chronic kidney disease), stage IIIa   . HTN (hypertension)   . Type II diabetes mellitus with renal manifestations [facility])     Patient Active Problem List   Diagnosis Date Noted  . Acute urinary retention 06/12/2019  . BPH with obstruction/lower urinary tract symptoms 06/12/2019  . UTI (urinary tract infection) 06/12/2019  . Hyperglycemia due to type 2 diabetes mellitus ([place]) 06/11/2019  . Metabolic acidosis 123456  . S/P ORIF (open reduction internal fixation) fracture 06/11/2019  . Hypotension 06/11/2019  . Acute metabolic encephalopathy 123456  . AKI (acute kidney injury) ([place]) 06/11/2019  . Postoperative anemia due to acute blood loss 05/16/2019  . Acute kidney injury superimposed on chronic kidney disease ([place])   . Closed fracture of left distal femur ([place]) 05/12/2019  .  Chronic systolic CHF (congestive heart failure) ([place]) 05/12/2019  . Leukocytosis 05/12/2019  . Type II diabetes mellitus with renal manifestations ([place])   . HTN (hypertension)   . CKD (chronic kidney disease), stage IIIa   . Atrial fibrillation [facility])     Past Surgical History:  Procedure Laterality Date  . APPENDECTOMY    . ORIF FEMUR FRACTURE Left 05/13/2019   Procedure: OPEN REDUCTION INTERNAL FIXATION (ORIF) of closed  left periprosthetic supracondylar femur fracture.;  Surgeon: Corky Mull, MD;  Location: [facility] ORS;  Service: Orthopedics;  Laterality: Left;  . REPLACEMENT TOTAL KNEE Left     Prior to Admission medications   Medication Sig Start Date End Date Taking? Authorizing Provider  apixaban (ELIQUIS) 2.5 MG TABS tablet Take 2.5 mg by mouth 2 (two) times daily.   Yes [provider]  ascorbic acid (VITAMIN C) 500 MG tablet Take 500 mg by mouth 2 (two) times daily.   Yes [provider]  aspirin 81 MG chewable tablet Chew 81 mg by mouth daily.   Yes [provider]  bisacodyl (DULCOLAX) 10 MG suppository Place 1 suppository (10 mg total) rectally daily as needed for moderate constipation. 05/16/19  Yes Dhungel, Nishant, MD  Ensure Max Protein (ENSURE MAX PROTEIN) LIQD Take 330 mLs (11 oz total) by mouth 2 (two) times daily between meals. 05/16/19  Yes Dhungel, Nishant, MD  ferrous sulfate 325 (65 FE) MG tablet Take 1 tablet (325 mg total) by mouth 2 (two) times daily with a meal. 05/16/19 05/15/20 Yes Dhungel,  Nishant, MD  furosemide (LASIX) 40 MG tablet Take 1 tablet (40 mg total) by mouth daily as needed for fluid or edema. 06/16/19  Yes Lorella Nimrod, MD  insulin NPH Human (NOVOLIN N) 100 UNIT/ML injection Inject 0.2 mLs (20 Units total) into the skin every evening. Patient taking differently: Inject 30 Units into the skin at bedtime.  05/16/19  Yes Dhungel, Nishant, MD  insulin regular (NOVOLIN R) 100 units/mL injection Inject 0.05 mLs (5 Units total) into the  skin 3 (three) times daily before meals. Patient taking differently: Inject 10 Units into the skin 3 (three) times daily before meals.  05/16/19  Yes Dhungel, Nishant, MD  polyethylene glycol (MIRALAX / GLYCOLAX) 17 g packet Take 17 g by mouth daily. 05/17/19  Yes Dhungel, Nishant, MD  therapeutic multivitamin-minerals (THERAGRAN-M) tablet Take 1 tablet by mouth every evening.   Yes [provider]  zinc sulfate 220 (50 Zn) MG capsule Take 220 mg by mouth every evening.   Yes [provider]    Allergies Bee venom, Ezetimibe, Fenofibrate micronized, Pravastatin, and Sulfa antibiotics  No family history on file.  Social History Social History   Tobacco Use  . Smoking status: Former Research scientist (life sciences)  . Smokeless tobacco: Never Used  Substance Use Topics  . Alcohol use: Never  . Drug use: Not Currently    Review of Systems  Review of Systems  Constitutional: Negative for chills, fatigue and fever.  HENT: Negative for sore throat.   Respiratory: Negative for shortness of breath.   Cardiovascular: Negative for chest pain.  Gastrointestinal: Negative for abdominal pain.  Genitourinary: Negative for flank pain.  Musculoskeletal: Positive for back pain. Negative for neck pain.  Skin: Positive for wound. Negative for rash.  Allergic/Immunologic: Negative for immunocompromised state.  Neurological: Negative for weakness and numbness.  Hematological: Does not bruise/bleed easily.  All other systems reviewed and are negative.    ____________________________________________  PHYSICAL EXAM:      VITAL SIGNS: ED Triage Vitals [07/02/19 1611]  Enc Vitals Group     BP      Pulse      Resp      Temp      Temp src      SpO2      Weight 230 lb (104.3 kg)     Height 5\' 11"  (1.803 m)     Head Circumference      Peak Flow      Pain Score 0     Pain Loc      Pain Edu?      Excl. in [place]?      Physical Exam Vitals and nursing note reviewed.  Constitutional:      General: He  is not in acute distress.    Appearance: He is well-developed.  HENT:     Head: Normocephalic and atraumatic.     Mouth/Throat:     Mouth: Mucous membranes are dry.  Eyes:     Conjunctiva/sclera: Conjunctivae normal.  Cardiovascular:     Rate and Rhythm: Normal rate and regular rhythm.     Heart sounds: Normal heart sounds.  Pulmonary:     Effort: Pulmonary effort is normal. No respiratory distress.     Breath sounds: No wheezing.  Abdominal:     General: There is no distension.  Genitourinary:    Comments: Foley in place, draining dark urine Musculoskeletal:     Cervical back: Neck supple.     Comments: Knee immobilizer on LLE  Skin:  General: Skin is warm.     Capillary Refill: Capillary refill takes 2 to 3 seconds.     Comments: Stage IV/non stage-able decubitus ulcer over sacrum with purulent, foul-smelling drainage and surrounding erythema  Neurological:     Mental Status: He is alert. He is disoriented.     Motor: No abnormal muscle tone.       ____________________________________________   LABS (all labs ordered are listed, but only abnormal results are displayed)  Labs Reviewed  CBC WITH DIFFERENTIAL/PLATELET - Abnormal; Notable for the following components:      Result Value   WBC 13.8 (*)    RBC 3.27 (*)    Hemoglobin 10.9 (*)    HCT 34.6 (*)    MCV 105.8 (*)    Neutro Abs 10.8 (*)    Monocytes Absolute 1.1 (*)    Abs Immature Granulocytes 0.20 (*)    All other components within normal limits  COMPREHENSIVE METABOLIC PANEL - Abnormal; Notable for the following components:   Chloride 117 (*)    CO2 15 (*)    Glucose, Bld 102 (*)    BUN 74 (*)    Creatinine, Ser 3.13 (*)    Calcium 8.5 (*)    Albumin 2.2 (*)    Alkaline Phosphatase 190 (*)    GFR calc non Af Amer 17 (*)    GFR calc Af Amer 20 (*)    All other components within normal limits  SEDIMENTATION RATE - Abnormal; Notable for the following components:   Sed Rate 84 (*)    All other  components within normal limits  LACTIC ACID, PLASMA - Abnormal; Notable for the following components:   Lactic Acid, Venous 3.2 (*)    All other components within normal limits  CULTURE, BLOOD (ROUTINE X 2)  CULTURE, BLOOD (ROUTINE X 2)  URINE CULTURE  RESPIRATORY PANEL BY RT PCR (FLU A&B, COVID)  C-REACTIVE PROTEIN  LACTIC ACID, PLASMA  URINALYSIS, ROUTINE W REFLEX MICROSCOPIC    ____________________________________________  EKG: AFib, VR 78. QRS 76, QTc 433. Non-specific ST changes, no acute St elevations or depressions. No ischemia. ________________________________________  RADIOLOGY All imaging, including plain films, CT scans, and ultrasounds, independently reviewed by me, and interpretations confirmed via formal radiology reads.  ED MD interpretation:   XR Sacrum: No definitive evidence of osteo  Official radiology report(s): DG Sacrum/Coccyx  Result Date: 07/02/2019 CLINICAL DATA:  Deep sacral wound. Concern for osteomyelitis. EXAM: SACRUM AND COCCYX - 2+ VIEW COMPARISON:  None. FINDINGS: On the lateral view, there is apparent soft tissue ulceration dorsal to the distal sacrum and coccyx. No foreign body or definite bone destruction is seen in this area. Osseous evaluation is limited by demineralization and overlying stool on the AP views. The hip and sacroiliac joint spaces are preserved. There are prominent endplate osteophytes in the lower lumbar spine. IMPRESSION: Soft tissue ulceration dorsal to the distal sacrum and coccyx without radiographic evidence of osteomyelitis. Osseous evaluation is limited by demineralization and overlying stool. Electronically Signed   By: Richardean Sale M.D.   On: 07/02/2019 17:40    ____________________________________________  PROCEDURES   Procedure(s) performed (including Critical Care):  .Critical Care Performed by: Duffy Bruce, MD Authorized by: Duffy Bruce, MD   Critical care provider statement:    Critical care time  (minutes):  45   Critical care time was exclusive of:  Separately billable procedures and treating other patients and teaching time   Critical care was necessary to treat or prevent  imminent or life-threatening deterioration of the following conditions:  Cardiac failure, circulatory failure, respiratory failure and sepsis   Critical care was time spent personally by me on the following activities:  Development of treatment plan with patient or surrogate, discussions with consultants, evaluation of patient's response to treatment, examination of patient, obtaining history from patient or surrogate, ordering and performing treatments and interventions, ordering and review of laboratory studies, ordering and review of radiographic studies, pulse oximetry, re-evaluation of patient's condition and review of old charts   I assumed direction of critical care for this patient from another provider in my specialty: no      ____________________________________________  INITIAL IMPRESSION / MDM / [place] / ED COURSE  As part of my medical decision making, I reviewed the following data within the [place] notes reviewed and incorporated, Old chart reviewed, Notes from prior ED visits, and [place] Controlled Substance Database       *Edward Strong was evaluated in Emergency Department on 07/02/2019 for the symptoms described in the history of present illness. He was evaluated in the context of the global COVID-19 pandemic, which necessitated consideration that the patient might be at risk for infection with the SARS-CoV-2 virus that causes COVID-19. Institutional protocols and algorithms that pertain to the evaluation of patients at risk for COVID-19 are in a state of rapid change based on information released by regulatory bodies including the CDC and federal and state organizations. These policies and algorithms were followed during the patient's care in the ED.  Some ED  evaluations and interventions may be delayed as a result of limited staffing during the pandemic.*     Medical Decision Making:  86 yo M here with infected sacral decubitus ulcer. Labs show significant leukocytosis w/ left shift, acute on chronic kidney injury, and lactic acidosis. He is mentating well on non-toxic on exam. Code sepsis initiated with broad-spectrum ABX and IVF (30 cc/kg, based on IBW). Will admit to medicine. Dr. Peyton Najjar of surgery consulted and is aware. Hold Eliquis.  ADDENDUM: While in ED awaiting admission to hospitalist, pt noted to be progressively more hypotensive. Will start peripheral levophed. Discussed with intensivist team who will see and admit. Received 4L IVF, appears euvolemic clinically. No alternative source of infection apparent. UA does show pyuria but has indwelling foley, and he has been initiated on broad-spectrum ABX. Abdomen is soft. No signs of surgical infection.  ____________________________________________  FINAL CLINICAL IMPRESSION(S) / ED DIAGNOSES  Final diagnoses:  Sepsis with acute renal failure without septic shock, due to unspecified organism, unspecified acute renal failure type ([place])  Sacral decubitus ulcer, stage IV ([facility])  Acute renal failure superimposed on stage 4 chronic kidney disease, unspecified acute renal failure type ([place])     MEDICATIONS GIVEN DURING THIS VISIT:  Medications  metroNIDAZOLE (FLAGYL) IVPB 500 mg (500 mg Intravenous New Bag/Given 07/02/19 1835)  vancomycin (VANCOREADY) IVPB 2000 mg/400 mL (has no administration in time range)  sodium chloride 0.9 % bolus 1,000 mL (has no administration in time range)  ceFEPIme (MAXIPIME) 2 g in sodium chloride 0.9 % 100 mL IVPB (0 g Intravenous Stopped 07/02/19 1748)  sodium chloride 0.9 % bolus 1,000 mL (1,000 mLs Intravenous New Bag/Given 07/02/19 1824)  lactated ringers bolus 1,000 mL (1,000 mLs Intravenous New Bag/Given 07/02/19 1826)     ED Discharge Orders    None        Note:  This document was prepared using Dragon voice recognition software and  may include unintentional dictation errors.   Duffy Bruce, MD 07/02/19 Hazle Nordmann    Duffy Bruce, MD 07/03/19 [phone]

## 2019-07-02 NOTE — Consult Note (Signed)
SURGICAL CONSULTATION NOTE   HISTORY OF PRESENT ILLNESS (HPI):  86 y.o. male presented to [facility] ED for evaluation of infected sacral ulcer. Patient poor historian. Most of the history taken from ED physician and son at beside. Patient currently on rehab after orthopaedic surgery. Patient developed a sacral ulcer since a months ago. It has been having local care at the wound care center. Today after evaluation at the wound care center, recommendations was to be transferred to hospital due to infection of the sacral ulcer.   At ED patient found with foul smelling sacral ulcer stage IV. Labs shows elevated creatinine, elevated lactic acid and elevated WBC count. Patient alert and oriented x 3  Surgery is consulted by Dr. Bland Span in this context for evaluation and management of infected stage four sacral ulcer.  PAST MEDICAL HISTORY (PMH):  Past Medical History:  Diagnosis Date  . Atrial fibrillation ([place])   . CKD (chronic kidney disease), stage IIIa   . HTN (hypertension)   . Type II diabetes mellitus with renal manifestations ([place])      PAST SURGICAL HISTORY ([place]):  Past Surgical History:  Procedure Laterality Date  . APPENDECTOMY    . ORIF FEMUR FRACTURE Left 05/13/2019   Procedure: OPEN REDUCTION INTERNAL FIXATION (ORIF) of closed  left periprosthetic supracondylar femur fracture.;  Surgeon: Corky Mull, MD;  Location: [facility] ORS;  Service: Orthopedics;  Laterality: Left;  . REPLACEMENT TOTAL KNEE Left      MEDICATIONS:  Prior to Admission medications   Medication Sig Start Date End Date Taking? Authorizing Provider  apixaban (ELIQUIS) 2.5 MG TABS tablet Take 2.5 mg by mouth 2 (two) times daily.   Yes [provider]  ascorbic acid (VITAMIN C) 500 MG tablet Take 500 mg by mouth 2 (two) times daily.   Yes [provider]  aspirin 81 MG chewable tablet Chew 81 mg by mouth daily.   Yes [provider]  bisacodyl (DULCOLAX) 10 MG suppository Place 1 suppository  (10 mg total) rectally daily as needed for moderate constipation. 05/16/19  Yes Dhungel, Nishant, MD  Ensure Max Protein (ENSURE MAX PROTEIN) LIQD Take 330 mLs (11 oz total) by mouth 2 (two) times daily between meals. 05/16/19  Yes Dhungel, Nishant, MD  ferrous sulfate 325 (65 FE) MG tablet Take 1 tablet (325 mg total) by mouth 2 (two) times daily with a meal. 05/16/19 05/15/20 Yes Dhungel, Nishant, MD  furosemide (LASIX) 40 MG tablet Take 1 tablet (40 mg total) by mouth daily as needed for fluid or edema. 06/16/19  Yes Lorella Nimrod, MD  insulin NPH Human (NOVOLIN N) 100 UNIT/ML injection Inject 0.2 mLs (20 Units total) into the skin every evening. Patient taking differently: Inject 30 Units into the skin at bedtime.  05/16/19  Yes Dhungel, Nishant, MD  insulin regular (NOVOLIN R) 100 units/mL injection Inject 0.05 mLs (5 Units total) into the skin 3 (three) times daily before meals. Patient taking differently: Inject 10 Units into the skin 3 (three) times daily before meals.  05/16/19  Yes Dhungel, Nishant, MD  polyethylene glycol (MIRALAX / GLYCOLAX) 17 g packet Take 17 g by mouth daily. 05/17/19  Yes Dhungel, Nishant, MD  therapeutic multivitamin-minerals (THERAGRAN-M) tablet Take 1 tablet by mouth every evening.   Yes [provider]  zinc sulfate 220 (50 Zn) MG capsule Take 220 mg by mouth every evening.   Yes [provider]     ALLERGIES:  Allergies  Allergen Reactions  . Bee Venom Other (  See Comments)  . Ezetimibe Other (See Comments)  . Fenofibrate Micronized Other (See Comments)  . Pravastatin Other (See Comments)  . Sulfa Antibiotics Other (See Comments)     SOCIAL HISTORY:  Social History   Socioeconomic History  . Marital status: Widowed    Spouse name: Not on file  . Number of children: Not on file  . Years of education: Not on file  . Highest education level: Not on file  Occupational History  . Not on file  Tobacco Use  . Smoking status: Former Research scientist (life sciences)  .  Smokeless tobacco: Never Used  Substance and Sexual Activity  . Alcohol use: Never  . Drug use: Not Currently  . Sexual activity: Not on file  Other Topics Concern  . Not on file  Social History Narrative  . Not on file   Social Determinants of Health   Financial Resource Strain:   . Difficulty of Paying Living Expenses: Not on file  Food Insecurity:   . Worried About Charity fundraiser in the Last Year: Not on file  . Ran Out of Food in the Last Year: Not on file  Transportation Needs:   . Lack of Transportation (Medical): Not on file  . Lack of Transportation (Non-Medical): Not on file  Physical Activity:   . Days of Exercise per Week: Not on file  . Minutes of Exercise per Session: Not on file  Stress:   . Feeling of Stress : Not on file  Social Connections:   . Frequency of Communication with Friends and Family: Not on file  . Frequency of Social Gatherings with Friends and Family: Not on file  . Attends Religious Services: Not on file  . Active Member of Clubs or Organizations: Not on file  . Attends Archivist Meetings: Not on file  . Marital Status: Not on file  Intimate Partner Violence:   . Fear of Current or Ex-Partner: Not on file  . Emotionally Abused: Not on file  . Physically Abused: Not on file  . Sexually Abused: Not on file    The patient currently resides (home / rehab facility / nursing home): Home The patient normally is (ambulatory / bedbound): Ambulatory   FAMILY HISTORY:  No family history on file.   REVIEW OF SYSTEMS:  Constitutional: denies weight loss, fever, chills, or sweats  Eyes: denies any other vision changes, history of eye injury  ENT: denies sore throat, hearing problems  Respiratory: denies shortness of breath, wheezing  Cardiovascular: denies chest pain, palpitations  Gastrointestinal: denies abdominal pain, N/V, or diarrhea Genitourinary: denies burning with urination or urinary frequency Musculoskeletal: positive  any other joint pains or cramps  Skin: denies any other rashes or skin discolorations. Positive for ulcer Neurological: denies any other headache, dizziness, weakness  Psychiatric: denies any other depression, anxiety   All other review of systems were negative   VITAL SIGNS:  Temp:  [97.6 F (36.4 C)] 97.6 F (36.4 C) (02/24 1632) Pulse Rate:  [79] 79 (02/24 1632) Resp:  [18] 18 (02/24 1632) BP: (117)/(99) 117/99 (02/24 1632) SpO2:  [100 %] 100 % (02/24 1632) Weight:  [104.3 kg] 104.3 kg (02/24 1611)     Height: 5\' 11"  (180.3 cm) Weight: 104.3 kg BMI (Calculated): 32.09   INTAKE/OUTPUT:  This shift: No intake/output data recorded.  Last 2 shifts: @IOLAST2SHIFTS @   PHYSICAL EXAM:  Constitutional:  -- Normal body habitus  -- Awake, alert, and oriented x3  Eyes:  -- Pupils equally round  and reactive to light  -- No scleral icterus  Ear, nose, and throat:  -- No jugular venous distension  Pulmonary:  -- No crackles  -- Equal breath sounds bilaterally -- Breathing non-labored at rest Cardiovascular:  -- S1, S2 present  -- No pericardial rubs Gastrointestinal:  -- Abdomen soft, nontender, non-distended, no guarding or rebound tenderness -- No abdominal masses appreciated, pulsatile or otherwise  Musculoskeletal and Integumentary:  -- Wounds or skin discoloration: large stage IV sacral ulcer, purulent secretions.  -- Extremities: B/L UE and LE FROM, hands and feet warm, pitting edema  Neurologic:  -- Motor function: intact and symmetric -- Sensation: intact and symmetric   Labs:  CBC Latest Ref Rng & Units 07/02/2019 06/15/2019 06/14/2019  WBC 4.0 - 10.5 K/uL 13.8(H) 9.9 9.4  Hemoglobin 13.0 - 17.0 g/dL 10.9(L) 11.0(L) 10.4(L)  Hematocrit 39.0 - 52.0 % 34.6(L) 34.0(L) 31.3(L)  Platelets 150 - 400 K/uL 333 184 176   CMP Latest Ref Rng & Units 07/02/2019 06/16/2019 06/15/2019  Glucose 70 - 99 mg/dL 102(H) 160(H) 167(H)  BUN 8 - 23 mg/dL 74(H) 71(H) 80(H)  Creatinine 0.61 -  1.24 mg/dL 3.13(H) 2.47(H) 2.70(H)  Sodium 135 - 145 mmol/L 143 145 150(H)  Potassium 3.5 - 5.1 mmol/L 4.6 4.3 3.8  Chloride 98 - 111 mmol/L 117(H) 115(H) 116(H)  CO2 22 - 32 mmol/L 15(L) 23 24  Calcium 8.9 - 10.3 mg/dL 8.5(L) 8.3(L) 8.6(L)  Total Protein 6.5 - 8.1 g/dL 6.6 - -  Total Bilirubin 0.3 - 1.2 mg/dL 0.7 - -  Alkaline Phos 38 - 126 U/L 190(H) - -  AST 15 - 41 U/L 41 - -  ALT 0 - 44 U/L 37 - -    Imaging studies:  EXAM: SACRUM AND COCCYX - 2+ VIEW  COMPARISON:  None.  FINDINGS: On the lateral view, there is apparent soft tissue ulceration dorsal to the distal sacrum and coccyx. No foreign body or definite bone destruction is seen in this area. Osseous evaluation is limited by demineralization and overlying stool on the AP views. The hip and sacroiliac joint spaces are preserved. There are prominent endplate osteophytes in the lower lumbar spine.  IMPRESSION: Soft tissue ulceration dorsal to the distal sacrum and coccyx without radiographic evidence of osteomyelitis. Osseous evaluation is limited by demineralization and overlying stool.   Electronically Signed   By: Richardean Sale M.D.   On: 07/02/2019 17:40  Assessment/Plan:  86 y.o. male with stage IV infected sacral ulcer, complicated by pertinent comorbidities including a fib on anticoagulation, htn, CKD stage 3a, diabetes mellitus type 2.  Patient with physical exam and labs consisted with infected sacral ulcer stage 4. Vital signs stable. Patient took Eliquis this morning. Will need debridement during admission . Recommend to hold Eliquis and keep patient NPO after midnight for possible debridement tomorrow in the afternoon. Son oriented about plan and about high risk of patient due to multiple medical comorbidities. He understood and agreed with plan. Agree admission to medicine for medical management of sepsis, and medical conditions.   Arnold Long, MD

## 2019-07-02 NOTE — Progress Notes (Signed)
Edward Edward Strong, Edward Edward Strong ([id number]) Visit Report for 07/02/2019 Chief Complaint Document Details Patient Name: Edward Strong, [name] Date of Service: 07/02/2019 2:00 PM Medical Record Number: [id number] Patient Account Number: [id number] Date of Birth/Sex: June 04, 1929 (86 y.o. 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 ([id number]) -------------------------------------------------------------------------------- HPI Details Patient Name: Edward Strong, [name] Date of Service: 07/02/2019 2:00 PM Medical Record Number: [id number] Patient Account Number: [id number] Date of Birth/Sex: Oct 31, 1929 (86 y.o. M) (86 y.o. M) Treating RN: Edward Edward Strong Primary Care Provider: Toni Edward Strong Other Clinician: Referring Provider: Lovie Macadamia, Strong Treating Provider/Extender: Edward Edward Strong in Treatment: 0 History of Present Illness HPI Description: ADMISSION 07/02/2019 This is a 86 year old man who was sent [facility] [place] [facility]. We did not really have much information on the status of the wound when Edward Strong first came here. I can see through [facility] health link that Edward Strong was admitted from 1/4 through 1/8 with an ORIF of the left distal femur. Edward Strong required repeat admission earlier this month from 06/11/2019 through 06/16/19 with acute renal failure largely secondary to urinary retention. At some point during the hospitalization Edward Strong was apparently discovered to have a pressure ulcer on his lower sacrum. We spoke to the wound care nurse at the nursing home Edward Strong is at currently said she has been aware of the wound for a week I am  really not sure when Edward Strong was admitted there. I called his son and Edward Strong reports that the wound was actually there in the nursing home before Edward Strong went for the most recent hospitalization although Edward Strong is really not certain of the extent of it. The current nursing home is been using Santyl with a covering wet-to-dry Past medical history; type 2 diabetes, chronic kidney failure stage III, atrial fibrillation on Eliquis, coronary artery disease, systolic congestive heart failure, history of Hodgkin's lymphoma, history of urinary retention undergoing in and out catheterizations Electronic Signature(s) Signed: 07/02/2019 5:01:35 PM By: Edward Ham MD Entered By: Edward Edward Strong on 07/02/2019 15:49:33 Edward Strong, [name] ([id number]) -------------------------------------------------------------------------------- Physical Exam Details Patient Name: Edward Strong, [name] Date of Service: 07/02/2019 2:00 PM Medical Record Number: [id number] Patient Account Number: [id number] Date of Birth/Sex: October 07, 1929 (86 y.o. M) (86 y.o. M) Treating RN: Edward Edward Strong Primary Care Provider: Toni Edward Strong Other Clinician: Referring Provider: Lovie Macadamia, Strong Treating Provider/Extender: Edward Edward Strong in Treatment: 0 Constitutional Patient is hypotensive but Edward Strong appears alert and responsive. Pulse regular and within target range for patient.Marland Kitchen Respirations regular, non-labored and within target range.. Temperature is normal and within the target range for the patient.Marland Kitchen appears in no distress. Respiratory Respiratory effort is easy and symmetric bilaterally. Rate is normal at rest and on room air.. Bilateral breath sounds are clear and equal in all lobes with no wheezes, rales or rhonchi.. Cardiovascular Heart rhythm and rate regular, without murmur or gallop. Edward Strong does not appear grossly dehydrated. Gastrointestinal (GI) Abdomen is soft and non-distended without masses or tenderness. Bowel sounds active in all quadrants.. No liver or  spleen enlargement or tenderness.. Integumentary (Hair, Skin) Erythema around the wound noted. Notes Wound exam; the patient has a grossly necrotic stage IV wound over a large area of the sacrum. This undermines and tunnels including  towards the rectum. At one point I thought there was a connection however rectal exam noted that the wall of his rectum appeared normal at least to feel. This is malodorous. There is necrotic muscle. There is no overt exposed bone but I have no doubt that when the necrotic tissue was removed this will reveal a large area of exposed sacrum coccyx Electronic Signature(s) Signed: 07/02/2019 5:01:35 PM By: Edward Ham MD Entered By: Edward Edward Strong on 07/02/2019 15:51:47 Edward Strong, [name] ([id number]) -------------------------------------------------------------------------------- Problem List Details Patient Name: [name], [name] Date of Service: 07/02/2019 2:00 PM Medical Record Number: [id number] Patient Account Number: [id number] Date of Birth/Sex: 1929-07-04 (86 y.o. M) Treating RN: Edward Edward Strong Primary Care Provider: Toni Edward Strong Other Clinician: Referring Provider: Lovie Macadamia, Strong Treating Provider/Extender: Edward Edward Strong in Treatment: 0 Active Problems ICD-10 Evaluated Encounter Code Description Active Date Today Diagnosis L89.154 Pressure ulcer of sacral region, stage 4 07/02/2019 No Yes M46.28 Osteomyelitis of vertebra, sacral and sacrococcygeal region 07/02/2019 No Yes Inactive Problems Resolved Problems Electronic Signature(s) Signed: 07/02/2019 5:01:35 PM By: Edward Ham MD Entered By: Edward Edward Strong on 07/02/2019 15:46:18 Edward Strong, [name] ([id number]) -------------------------------------------------------------------------------- Progress Note Details Patient Name: Edward Strong, [name] Date of Service: 07/02/2019 2:00 PM Medical Record Number: [id number] Patient Account Number: [id number] Date of Birth/Sex: 31-Jul-1929 (86 y.o.  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 We did not really have much information on the status of the wound when Edward Strong first came here. I can see through [facility] health link that Edward Strong was admitted from 1/4 through 1/8 with an ORIF of the left distal femur. Edward Strong required repeat admission earlier this month from 06/11/2019 through 06/16/19 with acute renal failure largely secondary to urinary retention. At some point during the hospitalization Edward Strong was apparently discovered to have a pressure ulcer on his lower sacrum. We spoke to the wound care nurse at the nursing home Edward Strong is at currently said she has been aware of the wound for a week I am really not sure when Edward Strong was admitted there. I called his son and Edward Strong reports that the wound was actually there in the nursing home before Edward Strong went for the most recent hospitalization although Edward Strong is really not certain of the extent of it. The current nursing home is been using Santyl with a covering wet-to-dry Past medical history; type 2 diabetes, chronic kidney failure stage III, atrial fibrillation on Eliquis, coronary artery disease, systolic congestive heart failure, history of Hodgkin's lymphoma, history of urinary retention undergoing in and out catheterizations Patient History Allergies bee pollen, ezetimibe, fenofibrate, NSAIDS (Non-Steroidal Anti-Inflammatory Drug), pravastatin, Statins-Hmg-Coa Reductase Inhibitors, Tricor, Sulfa (Sulfonamide Antibiotics) Family History Heart Disease - Siblings, Hypertension - Siblings, No family history of Cancer,  Diabetes, Hereditary Spherocytosis, Kidney Disease, Lung Disease, Seizures, Stroke, Thyroid Problems, Tuberculosis. Social History Former smoker - quit at age 67, Marital Status - Widowed, Alcohol Use - Never, Drug Use - No History, Caffeine Use - Daily. Medical History Cardiovascular Patient has history of Arrhythmia - a fib, Congestive Heart Failure, Coronary Artery Disease Denies history of Angina, Deep Vein Thrombosis, Hypertension, Hypotension, Myocardial Infarction, Peripheral Arterial Disease, Peripheral Venous Disease, Phlebitis,  Vasculitis Endocrine Patient has history of Type II Diabetes Denies history of Type I Diabetes Genitourinary Patient has history of End Stage Renal Disease - CKD stage 3 Integumentary (Skin) Denies history of History of Burn, History of pressure wounds Oncologic Patient has history of Received Chemotherapy Denies history of Received Radiation Medical And Surgical History Notes Oncologic NHL Review of Systems (ROS) Constitutional Symptoms (General Health) Denies complaints or symptoms of Fatigue, Fever, Chills, Marked Weight Change. Eyes Denies complaints or symptoms of Dry Eyes, Vision Changes, Glasses / Contacts. Edward Strong, [name] ([id number]) Ear/Nose/Mouth/Throat Denies complaints or symptoms of Difficult clearing ears, Sinusitis. Hematologic/Lymphatic Denies complaints or symptoms of Bleeding / Clotting Disorders, Human Immunodeficiency Virus. Respiratory Denies complaints or symptoms of Chronic or frequent coughs, Shortness of Breath. Cardiovascular Denies complaints or symptoms of Chest pain, LE edema. Gastrointestinal Denies complaints or symptoms of Frequent diarrhea, Nausea, Vomiting. Endocrine Denies complaints or symptoms of Hepatitis, Thyroid disease, Polydypsia (Excessive Thirst). Genitourinary Complains or has symptoms of Kidney failure/ Dialysis - CKD stage 3. Denies complaints or symptoms of  Incontinence/dribbling. Immunological Denies complaints or symptoms of Hives, Itching. Integumentary (Skin) Complains or has symptoms of Wounds. Denies complaints or symptoms of Bleeding or bruising tendency, Breakdown, Swelling. Musculoskeletal Denies complaints or symptoms of Muscle Pain, Muscle Weakness. Neurologic Denies complaints or symptoms of Numbness/parasthesias, Focal/Weakness. Psychiatric Denies complaints or symptoms of Anxiety, Claustrophobia. Objective Constitutional Patient is hypotensive but Edward Strong appears alert and responsive. Pulse regular and within target range for patient.Marland Kitchen Respirations regular, non-labored and within target range.. Temperature is normal and within the target range for the patient.Marland Kitchen appears in no distress. Vitals Time Taken: 2:10 PM, Height: 71 in, Source: Stated, Weight: 200 lbs, Source: Stated, BMI: 27.9, Temperature: 96.8 F, Pulse: 62 bpm, Respiratory Rate: 18 breaths/min, Blood Pressure: 90/58 mmHg. Respiratory Respiratory effort is easy and symmetric bilaterally. Rate is normal at rest and on room air.. Bilateral breath sounds are clear and equal in all lobes with no wheezes, rales or rhonchi.. Cardiovascular Heart rhythm and rate regular, without murmur or gallop. Edward Strong does not appear grossly dehydrated. Gastrointestinal (GI) Abdomen is soft and non-distended without masses or tenderness. Bowel sounds active in all quadrants.. No liver or spleen enlargement or tenderness.. General Notes: Wound exam; the patient has a grossly necrotic stage IV wound over a large area of the sacrum. This undermines and tunnels including towards the rectum. At one point I thought there was a connection however rectal exam noted that the wall of his rectum appeared normal at least to feel. This is malodorous. There is necrotic muscle. There is no overt exposed bone but I have no doubt that when the necrotic tissue was removed this will reveal a large area of exposed  sacrum coccyx Integumentary (Hair, Skin) Erythema around the wound noted. Wound #1 status is Open. Original cause of wound was Pressure Injury. The wound is located on the Sacrum. The wound measures 9.5cm length x 10.2cm width x 3.8cm depth; 76.105cm^2 area and 289.199cm^3 volume. There is muscle and Fat Layer (Subcutaneous Tissue) Exposed exposed. There is no tunneling noted, however, there is undermining starting at 10:00 and ending at 3:00 with a maximum distance of 4cm. There is a large amount of purulent drainage noted. Foul odor after cleansing was noted. The wound margin is flat and intact. There is no granulation within the wound bed. There is a large (67-100%) amount of necrotic tissue within the wound bed including Eschar, Adherent Slough and Necrosis of Muscle. Edward Strong, [name] ([id number])  Assessment Active Problems ICD-10 Pressure ulcer of sacral region, stage 4 Osteomyelitis of vertebra, sacral and sacrococcygeal region Plan 1. This is a grossly necrotic stage IV ulcer which is likely infected including soft tissue and underlying osteomyelitis. 2. There is a tunneling area here that probes towards his rectum but I could not prove the direct connection but that does not mean it does not exist 3. This is well beyond outpatient management at this point at the nursing home with or without our clinic's assistance. 4. I contacted his son who is the 1 who told me that the wound was actually present before the most recent hospitalization. Our staff also talked to the wound care nurse at [facility] who told us Edward Strong had been there for about a week I am not sure if Edward Strong came from a different facility or what the issue is here. Edward Strong left the hospital on 2/8 5. The patient may be on his way to being septic Edward Strong is slightly hypotensive. This is a gross wound that will require imaging IV antibiotics and a trip to the OR for an extensive debridement. 6. The patient initially said Edward Strong would refuse any  surgery I have discussed this with his son who will meet him there and hopefully make a decision the patient's best interests. 7. We will forward the patient to [facility] I spent 45 minutes overall in the review of this patient's records, discussion with family on the phone, face-to-face evaluation and examination of the patient and preparation of this record Electronic Signature(s) Signed: 07/02/2019 5:01:35 PM By: Edward Ham MD Entered By: Edward Edward Strong on 07/02/2019 15:55:00 Edward Strong, [name] ([id number]) -------------------------------------------------------------------------------- ROS/PFSH Details Patient Name: Edward Strong, [name] Date of Service: 07/02/2019 2:00 PM Medical Record Number: [id number] Patient Account Number: [id number] Date of Birth/Sex: 28-Jul-1929 (86 y.o. M) Treating RN: Montey Hora Primary Care Provider: Toni Edward Strong Other Clinician: Referring Provider: Lovie Macadamia, Strong Treating Provider/Extender: Edward Edward Strong in Treatment: 0 Constitutional Symptoms (General Health) Complaints and Symptoms: Negative for: Fatigue; Fever; Chills; Marked Weight Change Eyes Complaints and Symptoms: Negative for: Dry Eyes; Vision Changes; Glasses / Contacts Ear/Nose/Mouth/Throat Complaints and Symptoms: Negative for: Difficult clearing ears; Sinusitis Hematologic/Lymphatic Complaints and Symptoms: Negative for: Bleeding / Clotting Disorders; Human Immunodeficiency Virus Respiratory Complaints and Symptoms: Negative for: Chronic or frequent coughs; Shortness of Breath Cardiovascular Complaints and Symptoms: Negative for: Chest pain; LE edema Medical History: Positive for: Arrhythmia - a fib; Congestive Heart Failure; Coronary Artery Disease Negative for: Angina; Deep Vein Thrombosis; Hypertension; Hypotension; Myocardial Infarction; Peripheral Arterial Disease; Peripheral Venous Disease; Phlebitis; Vasculitis Gastrointestinal Complaints and  Symptoms: Negative for: Frequent diarrhea; Nausea; Vomiting Endocrine Complaints and Symptoms: Negative for: Hepatitis; Thyroid disease; Polydypsia (Excessive Thirst) Medical History: Positive for: Type II Diabetes Negative for: Type I Diabetes Genitourinary Complaints and Symptoms: Positive for: Kidney failure/ Dialysis - CKD stage 3 Negative for: Incontinence/dribbling Medical History: Positive for: End Stage Renal Disease - CKD stage 3 Heist, [name] ([id number]) Immunological Complaints and Symptoms: Negative for: Hives; Itching Integumentary (Skin) Complaints and Symptoms: Positive for: Wounds Negative for: Bleeding or bruising tendency; Breakdown; Swelling Medical History: Negative for: History of Burn; History of pressure wounds Musculoskeletal Complaints and Symptoms: Negative for: Muscle Pain; Muscle Weakness Neurologic Complaints and Symptoms: Negative for: Numbness/parasthesias; Focal/Weakness Psychiatric Complaints and Symptoms: Negative for: Anxiety; Claustrophobia Oncologic Medical History: Positive for: Received Chemotherapy Negative for: Received Radiation Past Medical History Notes: NHL Immunizations Pneumococcal Vaccine: Received Pneumococcal Vaccination: Yes Implantable Devices None Family and  Social History Cancer: No; Diabetes: No; Heart Disease: Yes - Siblings; Hereditary Spherocytosis: No; Hypertension: Yes - Siblings; Kidney Disease: No; Lung Disease: No; Seizures: No; Stroke: No; Thyroid Problems: No; Tuberculosis: No; Former smoker - quit at age 83; Marital Status - Widowed; Alcohol Use: Never; Drug Use: No History; Caffeine Use: Daily; Financial Concerns: No; Food, Clothing or Shelter Needs: No; Support System Lacking: No; Transportation Concerns: No Electronic Signature(s) Signed: 07/02/2019 4:59:40 PM By: Montey Hora Signed: 07/02/2019 5:01:35 PM By: Edward Ham MD Entered By: Montey Hora on 07/02/2019 14:32:01 Pigeon, Arnold A.  ([id number]) -------------------------------------------------------------------------------- SuperBill Details Patient Name: Starn, Derron A. Date of Service: 07/02/2019 Medical Record Number: [id number] Patient Account Number: [id number] Date of Birth/Sex: 1929/10/19 (86 y.o. 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

## 2019-07-02 NOTE — ED Notes (Signed)
Son at bedside.

## 2019-07-02 NOTE — ED Notes (Signed)
Sharion Settler NP notified of pt BP [id number] systolic, bolus ordered

## 2019-07-02 NOTE — Consult Note (Signed)
PHARMACY -  BRIEF ANTIBIOTIC NOTE   Pharmacy has received consult(s) for cellulitis from an ED provider.  The patient's profile has been reviewed for ht/wt/allergies/indication/available labs.    One time order(s) placed for cefepime and vancomycin   Further antibiotics/pharmacy consults should be ordered by admitting physician if indicated.                       Thank you, Oswald Hillock 07/02/2019  5:06 PM

## 2019-07-02 NOTE — ED Notes (Signed)
MD Isaacs at bedside  

## 2019-07-02 NOTE — ED Triage Notes (Addendum)
Pt to ED from wound center for chief complaint of wound infection. Per EMS wound center stated that pt "needs OR". Wound to mid back. EMS reports 97% RA, HR 76. HX dementia. Pt oriented to person only  Has foley catheter on arrival

## 2019-07-02 NOTE — Progress Notes (Signed)
NISHAAN, Strong ([id number]) Visit Report for 07/02/2019 Abuse/Suicide Risk Screen Details Patient Name: Edward Strong, [name] Date of Service: 07/02/2019 2:00 PM Medical Record Number: [id number] Patient Account Number: [id number] Date of Birth/Sex: [birth year] (86 y.o. M) Treating RN: Montey Hora Primary Care Smayan Hackbart: Toni Arthurs Other Clinician: Referring Newell Frater: Lovie Macadamia, DAVID Treating Jerame Hedding/Extender: Tito Dine in Treatment: 0 Abuse/Suicide Risk Screen Items Answer ABUSE RISK SCREEN: Has anyone close to you tried to hurt or harm you recentlyo No Do you feel uncomfortable with anyone in your familyo No Has anyone forced you do things that you didnot want to doo No Electronic Signature(s) Signed: 07/02/2019 4:59:40 PM By: Montey Hora Entered By: Montey Hora on 07/02/2019 14:32:55 Ruehl, Norrin A. ([id number]) -------------------------------------------------------------------------------- Activities of Daily Living Details Patient Name: Schmuhl, Kayl A. Date of Service: 07/02/2019 2:00 PM Medical Record Number: [id number] Patient Account Number: [id number] Date of Birth/Sex: 1929/06/10 (86 y.o. M) Treating RN: Montey Hora Primary Care Donnie Gedeon: Toni Arthurs Other Clinician: Referring Izekiel Flegel: Lovie Macadamia, DAVID Treating Eileen Croswell/Extender: Tito Dine in Treatment: 0 Activities of Daily Living Items Answer Activities of Daily Living (Please select one for each item) Drive Automobile Not Able Take Medications Need Assistance Use Telephone Need Assistance Care for Appearance Need Assistance Use Toilet Need Assistance Bath / Shower Need Assistance Dress Self Need Assistance Feed Self Completely Able Walk Not Able Get In / Out Bed Need Assistance Housework Not Able Prepare Meals Not Able Handle Money Not Able Shop for Self Not Able Electronic Signature(s) Signed: 07/02/2019 4:59:40 PM By: Montey Hora Entered By: Montey Hora on  07/02/2019 14:33:26 Hudlow, Kharee A. ([id number]) -------------------------------------------------------------------------------- Education Screening Details Patient Name: Cupples, Jaedan A. Date of Service: 07/02/2019 2:00 PM Medical Record Number: [id number] Patient Account Number: [id number] Date of Birth/Sex: 08-01-1929 (86 y.o. M) Treating RN: Montey Hora Primary Care Jehan Bonano: Toni Arthurs Other Clinician: Referring Chrsitopher Wik: Lovie Macadamia, DAVID Treating Odalis Jordan/Extender: Tito Dine in Treatment: 0 Primary Learner Assessed: Patient Learning Preferences/Education Level/Primary Language Learning Preference: Explanation, Demonstration Highest Education Level: High School Preferred Language: English Cognitive Barrier Language Barrier: No Translator Needed: No Memory Deficit: No Emotional Barrier: No Cultural/Religious Beliefs Affecting Medical Care: No Physical Barrier Impaired Vision: No Impaired Hearing: No Decreased Hand dexterity: No Knowledge/Comprehension Knowledge Level: Medium Comprehension Level: Medium Ability to understand written instructions: Medium Ability to understand verbal instructions: Medium Motivation Anxiety Level: Calm Cooperation: Cooperative Education Importance: Acknowledges Need Interest in Health Problems: Asks Questions Perception: Coherent Willingness to Engage in Self-Management Medium Activities: Readiness to Engage in Self-Management Medium Activities: Electronic Signature(s) Signed: 07/02/2019 4:59:40 PM By: Montey Hora Entered By: Montey Hora on 07/02/2019 14:33:48 Engelmann, Leroi A. ([id number]) -------------------------------------------------------------------------------- Fall Risk Assessment Details Patient Name: Strong, Darean A. Date of Service: 07/02/2019 2:00 PM Medical Record Number: [id number] Patient Account Number: [id number] Date of Birth/Sex: 03-30-1930 (86 y.o. M) Treating RN: Montey Hora Primary Care  Rj Pedrosa: Toni Arthurs Other Clinician: Referring Khaila Velarde: Lovie Macadamia, DAVID Treating Zaelynn Fuchs/Extender: Tito Dine in Treatment: 0 Fall Risk Assessment Items Have you had 2 or more falls in the last 12 monthso 0 No Have you had any fall that resulted in injury in the last 12 monthso 0 No FALLS RISK SCREEN History of falling - immediate or within 3 months 0 No Secondary diagnosis (Do you have 2 or more medical diagnoseso) 0 No Ambulatory aid None/bed rest/wheelchair/nurse 0 Yes Crutches/cane/walker 0 No Furniture 0 No Intravenous therapy Access/Saline/Heparin Lock 0 No Gait/Transferring Normal/ bed rest/ wheelchair 0 No Weak (short steps  with or without shuffle, stooped but able to lift head while walking, may seek 0 No support from furniture) Impaired (short steps with shuffle, may have difficulty arising from chair, head down, impaired 20 Yes balance) Mental Status Oriented to own ability 0 Yes Electronic Signature(s) Signed: 07/02/2019 4:59:40 PM By: Montey Hora Entered By: Montey Hora on 07/02/2019 14:34:03 Koper, Griffin A. ([id number]) -------------------------------------------------------------------------------- Foot Assessment Details Patient Name: Strong, Burlie A. Date of Service: 07/02/2019 2:00 PM Medical Record Number: [id number] Patient Account Number: [id number] Date of Birth/Sex: 1930-02-19 (86 y.o. M) Treating RN: Montey Hora Primary Care Payslee Bateson: Toni Arthurs Other Clinician: Referring Heidi Maclin: Lovie Macadamia, DAVID Treating Keyshawna Prouse/Extender: Tito Dine in Treatment: 0 Foot Assessment Items Site Locations + = Sensation present, - = Sensation absent, C = Callus, U = Ulcer R = Redness, W = Warmth, M = Maceration, PU = Pre-ulcerative lesion F = Fissure, S = Swelling, D = Dryness Assessment Right: Left: Other Deformity: No No Prior Foot Ulcer: No No Prior Amputation: No No Charcot Joint: No No Ambulatory Status:  Non-ambulatory Assistance Device: Wheelchair Gait: Steady Electronic Signature(s) Signed: 07/02/2019 4:59:40 PM By: Montey Hora Entered By: Montey Hora on 07/02/2019 14:38:44 Quiett, Jermar A. ([id number]) -------------------------------------------------------------------------------- Nutrition Risk Screening Details Patient Name: Alvi, Luisangel A. Date of Service: 07/02/2019 2:00 PM Medical Record Number: [id number] Patient Account Number: [id number] Date of Birth/Sex: [birth year] (86 y.o. M) Treating RN: Montey Hora Primary Care Antoin Dargis: Toni Arthurs Other Clinician: Referring Lawren Sexson: Lovie Macadamia, DAVID Treating Monicka Cyran/Extender: Tito Dine in Treatment: 0 Height (in): 71 Weight (lbs): 200 Body Mass Index (BMI): 27.9 Nutrition Risk Screening Items Score Screening NUTRITION RISK SCREEN: I have an illness or condition that made me change the kind and/or amount of food I eat 0 No I eat fewer than two meals per day 0 No I eat few fruits and vegetables, or milk products 0 No I have three or more drinks of beer, liquor or wine almost every day 0 No I have tooth or mouth problems that make it hard for me to eat 0 No I don't always have enough money to buy the food I need 0 No I eat alone most of the time 0 No I take three or more different prescribed or over-the-counter drugs a day 1 Yes Without wanting to, I have lost or gained 10 pounds in the last six months 0 No I am not always physically able to shop, cook and/or feed myself 0 No Nutrition Protocols Good Risk Protocol 0 No interventions needed Moderate Risk Protocol High Risk Proctocol Risk Level: Good Risk Score: 1 Electronic Signature(s) Signed: 07/02/2019 4:59:40 PM By: Montey Hora Entered By: Montey Hora on 07/02/2019 14:38:30

## 2019-07-02 NOTE — ED Notes (Signed)
Dr Ellender Hose notified of lactic, new orders for fluids

## 2019-07-02 NOTE — ED Notes (Signed)
Pt cbg 42, pt alert and talking, drinking at the time. Dr Ellender Hose notified, d50 ordered

## 2019-07-02 NOTE — Sepsis Progress Note (Signed)
Notified bedside nurse of need to administer fluid bolus.  

## 2019-07-02 NOTE — ED Notes (Signed)
Pt given sandwich tray and ginger ale. 

## 2019-07-03 ENCOUNTER — Inpatient Hospital Stay: Payer: Medicare Other | Admitting: Anesthesiology

## 2019-07-03 ENCOUNTER — Encounter: Payer: Self-pay | Admitting: Family Medicine

## 2019-07-03 ENCOUNTER — Encounter
Admission: EM | Disposition: A | Discharge status: Hospice | Payer: Self-pay | Source: Skilled Nursing Facility | Attending: Pulmonary Disease

## 2019-07-03 DIAGNOSIS — A419 Sepsis, unspecified organism: Secondary | ICD-10-CM | POA: Diagnosis present

## 2019-07-03 DIAGNOSIS — R6521 Severe sepsis with septic shock: Secondary | ICD-10-CM

## 2019-07-03 DIAGNOSIS — Z515 Encounter for palliative care: Secondary | ICD-10-CM

## 2019-07-03 DIAGNOSIS — Z7189 Other specified counseling: Secondary | ICD-10-CM

## 2019-07-03 DIAGNOSIS — I5022 Chronic systolic (congestive) heart failure: Secondary | ICD-10-CM

## 2019-07-03 DIAGNOSIS — L89154 Pressure ulcer of sacral region, stage 4: Secondary | ICD-10-CM

## 2019-07-03 HISTORY — PX: APPLICATION OF WOUND VAC: SHX5189

## 2019-07-03 HISTORY — PX: INCISION AND DRAINAGE OF WOUND: SHX1803

## 2019-07-03 LAB — BLOOD GAS, VENOUS
Acid-base deficit: 9.6 mmol/L — ABNORMAL HIGH (ref 0.0–2.0)
Bicarbonate: 14.8 mmol/L — ABNORMAL LOW (ref 20.0–28.0)
O2 Saturation: 68.9 %
Patient temperature: 37
pCO2, Ven: 28 mmHg — ABNORMAL LOW (ref 44.0–60.0)
pH, Ven: 7.33 (ref 7.250–7.430)
pO2, Ven: 39 mmHg (ref 32.0–45.0)

## 2019-07-03 LAB — GLUCOSE, CAPILLARY
Glucose-Capillary: 152 mg/dL — ABNORMAL HIGH (ref 70–99)
Glucose-Capillary: 184 mg/dL — ABNORMAL HIGH (ref 70–99)
Glucose-Capillary: 153 mg/dL — ABNORMAL HIGH (ref 70–99)
Glucose-Capillary: 152 mg/dL — ABNORMAL HIGH (ref 70–99)
Glucose-Capillary: 177 mg/dL — ABNORMAL HIGH (ref 70–99)
Glucose-Capillary: 338 mg/dL — ABNORMAL HIGH (ref 70–99)

## 2019-07-03 LAB — CBC
HCT: 28.7 % — ABNORMAL LOW (ref 39.0–52.0)
Hemoglobin: 9.3 g/dL — ABNORMAL LOW (ref 13.0–17.0)
MCH: 33.8 pg (ref 26.0–34.0)
MCHC: 32.4 g/dL (ref 30.0–36.0)
MCV: 104.4 fL — ABNORMAL HIGH (ref 80.0–100.0)
Platelets: 291 10*3/uL (ref 150–400)
RBC: 2.75 MIL/uL — ABNORMAL LOW (ref 4.22–5.81)
RDW: 14.8 % (ref 11.5–15.5)
WBC: 15.1 10*3/uL — ABNORMAL HIGH (ref 4.0–10.5)
nRBC: 0 % (ref 0.0–0.2)

## 2019-07-03 LAB — PHOSPHORUS: Phosphorus: 4.7 mg/dL — ABNORMAL HIGH (ref 2.5–4.6)

## 2019-07-03 LAB — URINE CULTURE: Culture: NO GROWTH

## 2019-07-03 LAB — BASIC METABOLIC PANEL
Anion gap: 10 (ref 5–15)
BUN: 68 mg/dL — ABNORMAL HIGH (ref 8–23)
CO2: 16 mmol/L — ABNORMAL LOW (ref 22–32)
Calcium: 7.9 mg/dL — ABNORMAL LOW (ref 8.9–10.3)
Chloride: 119 mmol/L — ABNORMAL HIGH (ref 98–111)
Creatinine, Ser: 2.67 mg/dL — ABNORMAL HIGH (ref 0.61–1.24)
GFR calc Af Amer: 24 mL/min — ABNORMAL LOW (ref >60)
GFR calc non Af Amer: 21 mL/min — ABNORMAL LOW (ref >60)
Glucose, Bld: 174 mg/dL — ABNORMAL HIGH (ref 70–99)
Potassium: 4.7 mmol/L (ref 3.5–5.1)
Sodium: 145 mmol/L (ref 135–145)

## 2019-07-03 LAB — MRSA PCR SCREENING: MRSA by PCR: POSITIVE — AB

## 2019-07-03 LAB — VANCOMYCIN, RANDOM: Vancomycin Rm: 16

## 2019-07-03 LAB — MAGNESIUM: Magnesium: 2.1 mg/dL (ref 1.7–2.4)

## 2019-07-03 SURGERY — IRRIGATION AND DEBRIDEMENT WOUND
Anesthesia: General | Site: Buttocks

## 2019-07-03 MED ORDER — MORPHINE SULFATE (PF) 2 MG/ML IV SOLN
1.0000 mg | INTRAVENOUS | Status: DC | PRN
  Administered 2019-07-05 – 2019-07-06 (×3): 2 mg via INTRAVENOUS
  Filled 2019-07-03 (×3): qty 1

## 2019-07-03 MED ORDER — HYDROCORTISONE NA SUCCINATE PF 100 MG IJ SOLR
50.0000 mg | Freq: Four times a day (QID) | INTRAMUSCULAR | Status: DC
  Administered 2019-07-03 – 2019-07-08 (×19): 50 mg via INTRAVENOUS
  Filled 2019-07-03 (×18): qty 2

## 2019-07-03 MED ORDER — SODIUM CHLORIDE 0.9 % IV SOLN
2.0000 g | INTRAVENOUS | Status: DC
  Administered 2019-07-03 – 2019-07-07 (×5): 2 g via INTRAVENOUS
  Filled 2019-07-03 (×7): qty 2

## 2019-07-03 MED ORDER — ROCURONIUM BROMIDE 50 MG/5ML IV SOLN
INTRAVENOUS | Status: AC
  Filled 2019-07-03: qty 1

## 2019-07-03 MED ORDER — INSULIN ASPART 100 UNIT/ML ~~LOC~~ SOLN
0.0000 [IU] | SUBCUTANEOUS | Status: DC
  Administered 2019-07-03: 11 [IU] via SUBCUTANEOUS
  Administered 2019-07-04: 5 [IU] via SUBCUTANEOUS
  Filled 2019-07-03 (×3): qty 1

## 2019-07-03 MED ORDER — FENTANYL CITRATE (PF) 100 MCG/2ML IJ SOLN
INTRAMUSCULAR | Status: AC
  Filled 2019-07-03: qty 4

## 2019-07-03 MED ORDER — ACETAMINOPHEN 10 MG/ML IV SOLN
INTRAVENOUS | Status: DC | PRN
  Administered 2019-07-03: 1000 mg via INTRAVENOUS

## 2019-07-03 MED ORDER — VANCOMYCIN VARIABLE DOSE PER UNSTABLE RENAL FUNCTION (PHARMACIST DOSING): Status: DC

## 2019-07-03 MED ORDER — HYDROGEN PEROXIDE 3 % EX SOLN
CUTANEOUS | Status: DC | PRN
  Administered 2019-07-03: 1

## 2019-07-03 MED ORDER — FENTANYL CITRATE (PF) 100 MCG/2ML IJ SOLN: 25.0000 ug | INTRAMUSCULAR | Status: DC | PRN

## 2019-07-03 MED ORDER — INSULIN ASPART 100 UNIT/ML ~~LOC~~ SOLN
0.0000 [IU] | Freq: Three times a day (TID) | SUBCUTANEOUS | Status: DC
  Administered 2019-07-03 (×3): 2 [IU] via SUBCUTANEOUS
  Filled 2019-07-03 (×3): qty 1

## 2019-07-03 MED ORDER — DEXAMETHASONE SODIUM PHOSPHATE 10 MG/ML IJ SOLN
INTRAMUSCULAR | Status: DC | PRN
  Administered 2019-07-03: 10 mg via INTRAVENOUS

## 2019-07-03 MED ORDER — LIDOCAINE HCL (CARDIAC) PF 100 MG/5ML IV SOSY
PREFILLED_SYRINGE | INTRAVENOUS | Status: DC | PRN
  Administered 2019-07-03: 100 mg via INTRAVENOUS

## 2019-07-03 MED ORDER — ASPIRIN 81 MG PO CHEW
81.0000 mg | CHEWABLE_TABLET | Freq: Every day | ORAL | Status: DC
  Administered 2019-07-04 – 2019-07-09 (×6): 81 mg via ORAL
  Filled 2019-07-03 (×6): qty 1

## 2019-07-03 MED ORDER — LACTATED RINGERS IV SOLN: INTRAVENOUS | Status: DC

## 2019-07-03 MED ORDER — ONDANSETRON HCL 4 MG/2ML IJ SOLN
INTRAMUSCULAR | Status: DC | PRN
  Administered 2019-07-03: 4 mg via INTRAVENOUS

## 2019-07-03 MED ORDER — SUGAMMADEX SODIUM 200 MG/2ML IV SOLN
INTRAVENOUS | Status: DC | PRN
  Administered 2019-07-03: 200 mg via INTRAVENOUS

## 2019-07-03 MED ORDER — STERILE WATER FOR INJECTION IV SOLN
INTRAVENOUS | Status: DC
  Filled 2019-07-03 (×2): qty 850

## 2019-07-03 MED ORDER — LIDOCAINE HCL (PF) 2 % IJ SOLN
INTRAMUSCULAR | Status: AC
  Filled 2019-07-03: qty 5

## 2019-07-03 MED ORDER — ETOMIDATE 2 MG/ML IV SOLN
INTRAVENOUS | Status: AC
  Filled 2019-07-03: qty 10

## 2019-07-03 MED ORDER — ONDANSETRON HCL 4 MG/2ML IJ SOLN
INTRAMUSCULAR | Status: AC
  Filled 2019-07-03: qty 2

## 2019-07-03 MED ORDER — FENTANYL CITRATE (PF) 100 MCG/2ML IJ SOLN
INTRAMUSCULAR | Status: DC | PRN
  Administered 2019-07-03 (×4): 25 ug via INTRAVENOUS

## 2019-07-03 MED ORDER — SODIUM BICARBONATE 8.4 % IV SOLN
50.0000 meq | Freq: Once | INTRAVENOUS | Status: AC
  Administered 2019-07-03: 50 meq via INTRAVENOUS
  Filled 2019-07-03: qty 50

## 2019-07-03 MED ORDER — FERROUS SULFATE 325 (65 FE) MG PO TABS
325.0000 mg | ORAL_TABLET | Freq: Two times a day (BID) | ORAL | Status: DC
  Administered 2019-07-03 – 2019-07-09 (×12): 325 mg via ORAL
  Filled 2019-07-03 (×13): qty 1

## 2019-07-03 MED ORDER — SUGAMMADEX SODIUM 200 MG/2ML IV SOLN
INTRAVENOUS | Status: AC
  Filled 2019-07-03: qty 2

## 2019-07-03 MED ORDER — OXYCODONE HCL 5 MG/5ML PO SOLN: 5.0000 mg | Freq: Once | ORAL | Status: DC | PRN

## 2019-07-03 MED ORDER — OXYCODONE HCL 5 MG PO TABS: 5.0000 mg | ORAL_TABLET | Freq: Once | ORAL | Status: DC | PRN

## 2019-07-03 MED ORDER — ETOMIDATE 2 MG/ML IV SOLN
INTRAVENOUS | Status: DC | PRN
  Administered 2019-07-03: 20 mg via INTRAVENOUS

## 2019-07-03 MED ORDER — VANCOMYCIN HCL 1250 MG/250ML IV SOLN
1250.0000 mg | Freq: Once | INTRAVENOUS | Status: AC
  Administered 2019-07-03: 1250 mg via INTRAVENOUS
  Filled 2019-07-03: qty 250

## 2019-07-03 MED ORDER — ROCURONIUM BROMIDE 100 MG/10ML IV SOLN
INTRAVENOUS | Status: DC | PRN
  Administered 2019-07-03: 50 mg via INTRAVENOUS

## 2019-07-03 MED ORDER — ASPIRIN 81 MG PO CHEW: 81.0000 mg | CHEWABLE_TABLET | Freq: Every day | ORAL | Status: DC

## 2019-07-03 MED ORDER — PHENYLEPHRINE HCL (PRESSORS) 10 MG/ML IV SOLN
INTRAVENOUS | Status: DC | PRN
  Administered 2019-07-03 (×7): 100 ug via INTRAVENOUS

## 2019-07-03 MED ORDER — ACETAMINOPHEN NICU IV SYRINGE 10 MG/ML
INTRAVENOUS | Status: AC
  Filled 2019-07-03: qty 1

## 2019-07-03 MED ORDER — LACTATED RINGERS IV BOLUS
1000.0000 mL | Freq: Once | INTRAVENOUS | Status: AC
  Administered 2019-07-03: 1000 mL via INTRAVENOUS

## 2019-07-03 MED ORDER — SODIUM BICARBONATE 8.4 % IV SOLN
100.0000 meq | Freq: Once | INTRAVENOUS | Status: AC
  Administered 2019-07-03: 100 meq via INTRAVENOUS
  Filled 2019-07-03: qty 50

## 2019-07-03 MED ORDER — CHLORHEXIDINE GLUCONATE CLOTH 2 % EX PADS
6.0000 | MEDICATED_PAD | Freq: Every day | CUTANEOUS | Status: DC
  Administered 2019-07-03 – 2019-07-07 (×5): 6 via TOPICAL

## 2019-07-03 MED ORDER — LACTATED RINGERS IV BOLUS
1000.0000 mL | Freq: Once | INTRAVENOUS | Status: AC
  Administered 2019-07-03: 11:00:00 1000 mL via INTRAVENOUS

## 2019-07-03 MED ORDER — MUPIROCIN 2 % EX OINT
TOPICAL_OINTMENT | Freq: Two times a day (BID) | CUTANEOUS | Status: DC
  Administered 2019-07-05: 1 via NASAL
  Filled 2019-07-03: qty 22

## 2019-07-03 MED ORDER — PANTOPRAZOLE SODIUM 40 MG IV SOLR
40.0000 mg | Freq: Every day | INTRAVENOUS | Status: DC
  Administered 2019-07-03 – 2019-07-07 (×5): 40 mg via INTRAVENOUS
  Filled 2019-07-03 (×5): qty 40

## 2019-07-03 SURGICAL SUPPLY — 27 items
BLADE SURG 15 STRL LF DISP TIS (BLADE) ×1 IMPLANT
BLADE SURG 15 STRL SS (BLADE) ×2
CANISTER SUCT 1200ML W/VALVE (MISCELLANEOUS) ×3 IMPLANT
CANISTER WOUND CARE 500ML ATS (WOUND CARE) ×3 IMPLANT
CNTNR SPEC 2.5X3XGRAD LEK (MISCELLANEOUS) ×1
CONT SPEC 4OZ STER OR WHT (MISCELLANEOUS) ×2
CONTAINER SPEC 2.5X3XGRAD LEK (MISCELLANEOUS) ×1 IMPLANT
COVER WAND RF STERILE (DRAPES) ×3 IMPLANT
DRAPE LAPAROTOMY 100X77 ABD (DRAPES) ×3 IMPLANT
DRAPE LAPAROTOMY 77X122 PED (DRAPES) ×3 IMPLANT
DRSG VAC ATS MED SENSATRAC (GAUZE/BANDAGES/DRESSINGS) ×3 IMPLANT
ELECT REM PT RETURN 9FT ADLT (ELECTROSURGICAL) ×3
ELECTRODE REM PT RTRN 9FT ADLT (ELECTROSURGICAL) ×1 IMPLANT
GLOVE BIO SURGEON STRL SZ 6.5 (GLOVE) ×2 IMPLANT
GLOVE BIO SURGEONS STRL SZ 6.5 (GLOVE) ×1
GLOVE BIOGEL PI IND STRL 6.5 (GLOVE) ×1 IMPLANT
GLOVE BIOGEL PI INDICATOR 6.5 (GLOVE) ×2
GOWN STRL REUS W/ TWL LRG LVL3 (GOWN DISPOSABLE) ×2 IMPLANT
GOWN STRL REUS W/TWL LRG LVL3 (GOWN DISPOSABLE) ×4
KIT TURNOVER KIT A (KITS) ×3 IMPLANT
NEEDLE HYPO 22GX1.5 SAFETY (NEEDLE) ×3 IMPLANT
NS IRRIG 1000ML POUR BTL (IV SOLUTION) ×3 IMPLANT
PACK BASIN MINOR ARMC (MISCELLANEOUS) ×3 IMPLANT
SOL PREP PVP 2OZ (MISCELLANEOUS) ×6
SOLUTION PREP PVP 2OZ (MISCELLANEOUS) ×2 IMPLANT
SPONGE LAP 18X18 RF (DISPOSABLE) ×6 IMPLANT
SWABSTK COMLB BENZOIN TINCTURE (MISCELLANEOUS) ×3 IMPLANT

## 2019-07-03 NOTE — Op Note (Signed)
ATTENDING Surgeon(s): Herbert Pun, MD   ANESTHESIA: General   PRE-OPERATIVE DIAGNOSIS: Stage IV infected Sacral Pressure Ulcer   POST-OPERATIVE DIAGNOSIS: Stage four infected sacral ulcer   PROCEDURE(S):  1.) Sharp excisional debridement of sacral ulcer down to bone 2.) Negative pressure dressing placement   INTRAOPERATIVE FINDINGS:  A 15 cm x 10 cm and 4 cm deep stage four sacral ulcer without purulent secretions   ESTIMATED BLOOD LOSS: 50 mL      SPECIMENS: sacral ulcer necrotic tissue   COMPLICATIONS: None apparent   CONDITION AT END OF PROCEDURE: Hemodynamically stable and awake   INDICATIONS FOR PROCEDURE:  Patient is a 86 y.o. male with infected sacral pressure ulcer. Patient with leukocytosis and purulent secretions with necrotic tissue from sacral wound.      DETAILS OF PROCEDURE: After informed consent,patient was taken to the OR. Time out performed. General anesthesia induced. Patient placed on prone position. The sacrum was cleaned and draped in sterile fashion. With #15 blade, necrotic tissue from the sacral area was resected. Ischemic fat tissue and muscle and bone were debrided down to the sacral bone on the midline and to the gluteal muscle laterally. Hemostasis achieved. Negative pressure dressing was applied in steps. Patient tolerated the procedure well.

## 2019-07-03 NOTE — Anesthesia Preprocedure Evaluation (Addendum)
Anesthesia Evaluation  Patient identified by MRN, date of birth, ID band Patient awake and Patient confused    Reviewed: Allergy & Precautions, H&P , NPO status , Patient's Chart, lab work & pertinent test results  History of Anesthesia Complications Negative for: history of anesthetic complications  Airway Mallampati: III  TM Distance: >3 FB Neck ROM: limited    Dental  (+) Chipped, Poor Dentition, Missing   Pulmonary neg shortness of breath, former smoker,           Cardiovascular hypertension, + CAD, + Cardiac Stents and +CHF  + dysrhythmias Atrial Fibrillation      Neuro/Psych PSYCHIATRIC DISORDERS Dementia CVA, Residual Symptoms    GI/Hepatic negative GI ROS, Neg liver ROS,   Endo/Other  diabetes, Type 2, Insulin Dependent  Renal/GU ARF and CRFRenal disease     Musculoskeletal   Abdominal   Peds  Hematology negative hematology ROS (+)   Anesthesia Other Findings Past Medical History: No date: Atrial fibrillation (HCC) No date: CKD (chronic kidney disease), stage IIIa No date: HTN (hypertension) No date: Type II diabetes mellitus with renal manifestations ([place])  Past Surgical History: No date: APPENDECTOMY 05/13/2019: ORIF FEMUR FRACTURE; Left     Comment:  Procedure: OPEN REDUCTION INTERNAL FIXATION (ORIF) of               closed  left periprosthetic supracondylar femur               fracture.;  Surgeon: Corky Mull, MD;  Location: [facility]               ORS;  Service: Orthopedics;  Laterality: Left; No date: REPLACEMENT TOTAL KNEE; Left  BMI    Body Mass Index: 27.40 kg/m      Reproductive/Obstetrics negative OB ROS                            Anesthesia Physical Anesthesia Plan  ASA: IV  Anesthesia Plan: General ETT   Post-op Pain Management:    Induction: Intravenous  PONV Risk Score and Plan: Ondansetron, Dexamethasone, Midazolam and Treatment may vary due to age or  medical condition  Airway Management Planned: Oral ETT  Additional Equipment:   Intra-op Plan:   Post-operative Plan: Extubation in OR and Possible Post-op intubation/ventilation  Informed Consent: I have reviewed the patients History and Physical, chart, labs and discussed the procedure including the risks, benefits and alternatives for the proposed anesthesia with the patient or authorized representative who has indicated his/her understanding and acceptance.     Dental Advisory Given  Plan Discussed with: Anesthesiologist, CRNA and Surgeon  Anesthesia Plan Comments: (History and phone consent from the patients son OLUWATAMILORE MONN at [phone]   Reconfirmed suspension of DNR with son at 82  Son consented for risks of anesthesia including but not limited to:  - adverse reactions to medications - damage to teeth, lips or other oral mucosa - sore throat or hoarseness - Damage to heart, brain, lungs or loss of life  Son voiced understanding.)       Anesthesia Quick Evaluation

## 2019-07-03 NOTE — Progress Notes (Signed)
Pharmacy Antibiotic Note  Edward Strong is a 86 y.o. male admitted on 07/02/2019 with decubitus sacral ulcer wound.  Pharmacy has been consulted for vanc/cefepime dosing. Patient received vanc 2g IV and cefepime 2g IV x 1 in ED.  Plan: Will continue cefepime 2g IV q24h per CrCl 11 - 29 ml/min  Patient is currently in AKI on CKD w/ Scr of 3.13 (baseline Scr of 1.9 - 2.2). Will check a vanc random level w/ am labs to assess clearance and will re-dose 15 mg/kg once random level < 20 mcg/mL. Will start scheduled regimen once renal function stabilizes or reaches baseline and continue to monitor.   Height: 5\' 11"  (180.3 cm) Weight: 196 lb 6.9 oz (89.1 kg) IBW/kg (Calculated) : 75.3  Temp (24hrs), Avg:98 F (36.7 C), Min:97.6 F (36.4 C), Max:98.3 F (36.8 C)  Recent Labs  Lab 07/02/19 1628 07/02/19 1856  WBC 13.8*  --   CREATININE 3.13*  --   LATICACIDVEN 3.2* 2.1*    Estimated Creatinine Clearance: 17.7 mL/min (A) (by C-G formula based on SCr of 3.13 mg/dL (H)).    Allergies  Allergen Reactions  . Bee Venom Other (See Comments)  . Ezetimibe Other (See Comments)  . Fenofibrate Micronized Other (See Comments)  . Pravastatin Other (See Comments)  . Sulfa Antibiotics Other (See Comments)   Thank you for allowing pharmacy to be a part of this patient's care.  Tobie Lords, PharmD, BCPS Clinical Pharmacist 07/03/2019 2:21 AM

## 2019-07-03 NOTE — Anesthesia Procedure Notes (Signed)
Procedure Name: Intubation Date/Time: 07/03/2019 12:09 PM Performed by: Caryl Asp, CRNA Pre-anesthesia Checklist: Patient identified, Patient being monitored, Timeout performed, Emergency Drugs available and Suction available Patient Re-evaluated:Patient Re-evaluated prior to induction Oxygen Delivery Method: Circle system utilized Preoxygenation: Pre-oxygenation with 100% oxygen Induction Type: IV induction Ventilation: Mask ventilation without difficulty and Oral airway inserted - appropriate to patient size Laryngoscope Size: McGraph and 4 Grade View: Grade I Tube type: Oral Tube size: 7.5 mm Number of attempts: 1 Airway Equipment and Method: Stylet Placement Confirmation: ETT inserted through vocal cords under direct vision,  positive ETCO2 and breath sounds checked- equal and bilateral Secured at: 22 cm Tube secured with: Tape Dental Injury: Teeth and Oropharynx as per pre-operative assessment

## 2019-07-03 NOTE — Transfer of Care (Signed)
Immediate Anesthesia Transfer of Care Note  Patient: Edward Strong  Procedure(s) Performed: IRRIGATION AND DEBRIDEMENT SACRAL ULCER (N/A Buttocks) APPLICATION OF WOUND VAC (N/A Buttocks)  Patient Location: PACU  Anesthesia Type:General  Level of Consciousness: drowsy  Airway & Oxygen Therapy: Patient Spontanous Breathing and Patient connected to face mask oxygen  Post-op Assessment: Report given to RN and Post -op Vital signs reviewed and stable  Post vital signs: Reviewed and stable  Last Vitals:  Vitals Value Taken Time  BP 102/49 07/03/19 1334  Temp    Pulse 72 07/03/19 1336  Resp 13 07/03/19 1336  SpO2 100 % 07/03/19 1336  Vitals shown include unvalidated device data.  Last Pain:  Vitals:   07/03/19 0800  TempSrc: Axillary  PainSc: 0-No pain         Complications: No apparent anesthesia complications

## 2019-07-03 NOTE — Progress Notes (Signed)
Pharmacy Antibiotic Note  Edward Strong is a 86 y.o. male admitted on 07/02/2019 with septic shock secondary to stage IV infected sacral ulceration. Patient with past medical history significant for atrial fibrillation, dementia, Hodgkin's Lymphoma, Urinary Retention and UTI leading to recent hospitalization, CKD, hypertension, and diabetes. Patient s/p debridement on 2/25. Pharmacy has been consulted for cefepime and vancomycin dosing. Patient also receiving metronidazole 500mg  IV Q8hr.   Plan: Concern for total vancomycin dosing of 3250mg  in 12 hour period in setting of recent hospitalization for urinary retention and AKI. Pharmacy to not redose prior to am rounds on 2/26. Vancomycin variable dosing entered. Will follow up with serum creatinine in am and dose as appropriate per team.    Continue cefepime 2g IV Q24hr. Serum creatinine with am labs.   Height: 5\' 11"  (180.3 cm) Weight: 196 lb 6.9 oz (89.1 kg) IBW/kg (Calculated) : 75.3  Temp (24hrs), Avg:97.9 F (36.6 C), Min:97.6 F (36.4 C), Max:98.3 F (36.8 C)  Recent Labs  Lab 07/02/19 1628 07/02/19 1856 07/03/19 0510  WBC 13.8*  --  15.1*  CREATININE 3.13*  --  2.67*  LATICACIDVEN 3.2* 2.1*  --   VANCORANDOM  --   --  16    Estimated Creatinine Clearance: 20.8 mL/min (A) (by C-G formula based on SCr of 2.67 mg/dL (H)).    Allergies  Allergen Reactions  . Bee Venom Other (See Comments)  . Ezetimibe Other (See Comments)  . Fenofibrate Micronized Other (See Comments)  . Pravastatin Other (See Comments)  . Sulfa Antibiotics Other (See Comments)    Antimicrobials this admission: Cefepime 2/24 >>  Metronidazole 2/24 >> Vancomycin 2/24 >>   Dose adjustments this admission: N/A  Microbiology results: 2/25 Tissue Culture: pending  2/24 BCx: no growth < 12 hours  2/25 MRSA PCR: positive  2/24 SARS Coronavirus: negative  2/24 Influenza A/B: negative   Thank you for allowing pharmacy to be a part of this patient's  care.  Edward Strong L 07/03/2019 4:30 PM

## 2019-07-03 NOTE — Anesthesia Postprocedure Evaluation (Signed)
Anesthesia Post Note  Patient: Edward Strong  Procedure(s) Performed: IRRIGATION AND DEBRIDEMENT SACRAL ULCER (N/A Buttocks) APPLICATION OF WOUND VAC (N/A Buttocks)  Patient location during evaluation: PACU Anesthesia Type: General Level of consciousness: awake and alert Pain management: pain level controlled Vital Signs Assessment: post-procedure vital signs reviewed and stable Respiratory status: spontaneous breathing, nonlabored ventilation, respiratory function stable and patient connected to nasal cannula oxygen Cardiovascular status: blood pressure returned to baseline and stable Postop Assessment: no apparent nausea or vomiting Anesthetic complications: no     Last Vitals:  Vitals:   07/03/19 1349 07/03/19 1404  BP: (!) 117/57 (!) 116/52  Pulse: 80 (!) 41  Resp: 13 15  Temp:  36.5 C  SpO2: 97% 97%    Last Pain:  Vitals:   07/03/19 1404  TempSrc:   PainSc: 0-No pain                 [name]

## 2019-07-03 NOTE — Progress Notes (Signed)
Patient is AOx2.  He understands he is in the hospital but I had to reorient him to [facility] ICU.  He knows his name.  He was brought to [place] this evening from a nursing home to ED because of altered mental status.  He was assessed to be septic and has sustained a deep tissue injury to his sacrum.  Surgical staff has been by to assess patient for procedure along with anesthesia.  Patient heart sounds S1S2 but distant.  On monitor his electrodes have been alarming asystole however upon assessment patient is alert and awake and has peripheral pulses and apical pulse via auscultation.  Physician aware of electrical equipment conduction issue.  I shaved patients chest in areas for electrode placement but the conduction issue for the medical equipment persists.  Contacted Central Telemetry to switch the leads for a possible alternate observation of telemetry reading. Breath sounds are diminished.  Patient has the hiccups.  Bowel sounds are active and clear.  He had significant stool output this morning upon assessment.  Old dressing of the sacrum was removed and his body was cleaned.  Redressed sacrum with new wet to dry gauze and then covered with ABD and mepilex for support.  He has been NPO since arrival.  Receiving Levo at 83mcg/min with SBP 102/49 (66).  BiCarb running at 34ml/hr.  Foley and peri care cleaning provided this morning.  UOP WDL.

## 2019-07-03 NOTE — Consult Note (Signed)
Name: Edward Strong MRN: [id number] DOB: [birth year]    ADMISSION DATE:  07/02/2019 CONSULTATION DATE: 07/02/2019  REFERRING MD : Dr. Ellender Hose and Sharion Settler, NP   CHIEF COMPLAINT: Stage IV Sacral Ulcer   BRIEF PATIENT DESCRIPTION: 86 yo male admitted with acute on chronic renal failure with metabolic acidosis/lactic acidosis and septic shock requiring levophed gtt secondary to stage IV sacral ulcer   SIGNIFICANT EVENTS/STUDIES:  02/24: Pt initially admitted to the stepdown unit with sepsis  02/25: While awaiting stepdown bed availability pt became hypotensive requiring levophed gtt and transfer to ICU PCCM team assumed care    HISTORY OF PRESENT ILLNESS:   This is an 86 yo male with a PMH of Chronic Atrial Fibrillation on Eliquis, Dementia, Hodgkin's Lymphoma, Recent Hospitalization (02/3-02/8) due to Acute Renal Failure Secondary to Urinary Rentention and UTI, Stage IIIa Chronic Kidney Disease, HTN, and Type II Diabetes Mellitus.  He presented to [facility] ER on 02/24 from the wound care center with a sacral spine ulcer.  He has a recent history of a femur fracture following a fall requiring surgery and SNF placement for rehab.  He subsequently developed a sacral ulcer 1 month prior to current ER presentation and received wound care at [facility].  He was later referred to a wound care center with his initial appointment on 02/24. When he arrived at the wound care center pt noted to be hypotensive, and upon assessment of the sacral ulceration findings were a grossly necrotic stage IV wound over a large area of the sacrum.  Due to concern of possible sepsis and need for extensive debridement pt transferred to [facility] ER for further evaluation. In the ER labs revealed chloride 117, CO2 15, glucose 102, BUN 74, creatinine 3.13, alk phos 190, albumin 2.2, lactic acid 3.2, wbc 13.8, crp 9.6, and sed rate 84. He received 2L LR bolus and 2L NS bolus.  His vital signs were initially stable, and pt  admitted to the stepdown unit per hospitalist team for additional workup and treatment.  However, while awaiting stepdown bed availability he became severely hypotensive requiring levophed gtt and transfer to ICU with PCCM team assuming care.  PAST MEDICAL HISTORY :   has a past medical history of Atrial fibrillation ([place]), CKD (chronic kidney disease), stage IIIa, HTN (hypertension), and Type II diabetes mellitus with renal manifestations ([place]).  has a past surgical history that includes Appendectomy; Replacement total knee (Left); and ORIF femur fracture (Left, 05/13/2019). Prior to Admission medications   Medication Sig Start Date End Date Taking? Authorizing Provider  apixaban (ELIQUIS) 2.5 MG TABS tablet Take 2.5 mg by mouth 2 (two) times daily.   Yes [provider]  ascorbic acid (VITAMIN C) 500 MG tablet Take 500 mg by mouth 2 (two) times daily.   Yes [provider]  aspirin 81 MG chewable tablet Chew 81 mg by mouth daily.   Yes [provider]  bisacodyl (DULCOLAX) 10 MG suppository Place 1 suppository (10 mg total) rectally daily as needed for moderate constipation. 05/16/19  Yes Dhungel, Nishant, MD  Ensure Max Protein (ENSURE MAX PROTEIN) LIQD Take 330 mLs (11 oz total) by mouth 2 (two) times daily between meals. 05/16/19  Yes Dhungel, Nishant, MD  ferrous sulfate 325 (65 FE) MG tablet Take 1 tablet (325 mg total) by mouth 2 (two) times daily with a meal. 05/16/19 05/15/20 Yes Dhungel, Nishant, MD  furosemide (LASIX) 40 MG tablet Take 1 tablet (40 mg total) by mouth daily as  needed for fluid or edema. 06/16/19  Yes Lorella Nimrod, MD  insulin NPH Human (NOVOLIN N) 100 UNIT/ML injection Inject 0.2 mLs (20 Units total) into the skin every evening. Patient taking differently: Inject 30 Units into the skin at bedtime.  05/16/19  Yes Dhungel, Nishant, MD  insulin regular (NOVOLIN R) 100 units/mL injection Inject 0.05 mLs (5 Units total) into the skin 3 (three) times daily before  meals. Patient taking differently: Inject 10 Units into the skin 3 (three) times daily before meals.  05/16/19  Yes Dhungel, Nishant, MD  polyethylene glycol (MIRALAX / GLYCOLAX) 17 g packet Take 17 g by mouth daily. 05/17/19  Yes Dhungel, Nishant, MD  therapeutic multivitamin-minerals (THERAGRAN-M) tablet Take 1 tablet by mouth every evening.   Yes [provider]  zinc sulfate 220 (50 Zn) MG capsule Take 220 mg by mouth every evening.   Yes [provider]   Allergies  Allergen Reactions  . Bee Venom Other (See Comments)  . Ezetimibe Other (See Comments)  . Fenofibrate Micronized Other (See Comments)  . Pravastatin Other (See Comments)  . Sulfa Antibiotics Other (See Comments)    FAMILY HISTORY:  family history is not on file. SOCIAL HISTORY:  reports that he has quit smoking. He has never used smokeless tobacco. He reports previous drug use. He reports that he does not drink alcohol.  REVIEW OF SYSTEMS: Positives in BOLD  Constitutional: Negative for fever, chills, weight loss, malaise/fatigue and diaphoresis.  HENT: Negative for hearing loss, ear pain, nosebleeds, congestion, sore throat, neck pain, tinnitus and ear discharge.   Eyes: Negative for blurred vision, double vision, photophobia, pain, discharge and redness.  Respiratory: Negative for cough, hemoptysis, sputum production, shortness of breath, wheezing and stridor.   Cardiovascular: Negative for chest pain, palpitations, orthopnea, claudication, leg swelling and PND.  Gastrointestinal: Negative for heartburn, nausea, vomiting, abdominal pain, diarrhea, constipation, blood in stool and melena.  Genitourinary: Negative for dysuria, urgency, frequency, hematuria and flank pain.  Musculoskeletal: Negative for myalgias, back pain, joint pain and falls.  Skin: sacral spine ulceration,  itching and rash.  Neurological: Negative for dizziness, tingling, tremors, sensory change, speech change, focal weakness,  seizures, loss of consciousness, weakness and headaches.  Endo/Heme/Allergies: Negative for environmental allergies and polydipsia. Does not bruise/bleed easily.  SUBJECTIVE:  c/o mild sacral pain   VITAL SIGNS: Temp:  [97.6 F (36.4 C)-98.3 F (36.8 C)] 98.3 F (36.8 C) (02/25 0130) Pulse Rate:  [67-79] 74 (02/25 0130) Resp:  [15-20] 20 (02/25 0130) BP: (74-118)/(45-99) 118/68 (02/25 0130) SpO2:  [98 %-100 %] 98 % (02/25 0130) Weight:  [89.1 kg-104.3 kg] 89.1 kg (02/25 0130)  PHYSICAL EXAMINATION: General: well developed, well nourished elderly male, resting in bed, NAD  Neuro: alert and oriented, follows commands  HEENT: supple, no JVD  Cardiovascular: irregular irregular, no R/G  Lungs: clear throughout, even, non labored  Abdomen: +BS x4, soft, non tender, non distended  Musculoskeletal: normal bulk and tone, no edema  Skin: stage IV infected sacral ulceration   Recent Labs  Lab 07/02/19 1628  NA 143  K 4.6  CL 117*  CO2 15*  BUN 74*  CREATININE 3.13*  GLUCOSE 102*   Recent Labs  Lab 07/02/19 1628  HGB 10.9*  HCT 34.6*  WBC 13.8*  PLT 333   DG Sacrum/Coccyx  Result Date: 07/02/2019 CLINICAL DATA:  Deep sacral wound. Concern for osteomyelitis. EXAM: SACRUM AND COCCYX - 2+ VIEW COMPARISON:  None. FINDINGS: On the lateral view, there is  apparent soft tissue ulceration dorsal to the distal sacrum and coccyx. No foreign body or definite bone destruction is seen in this area. Osseous evaluation is limited by demineralization and overlying stool on the AP views. The hip and sacroiliac joint spaces are preserved. There are prominent endplate osteophytes in the lower lumbar spine. IMPRESSION: Soft tissue ulceration dorsal to the distal sacrum and coccyx without radiographic evidence of osteomyelitis. Osseous evaluation is limited by demineralization and overlying stool. Electronically Signed   By: Richardean Sale M.D.   On: 07/02/2019 17:40    ASSESSMENT /  PLAN:  Septic shock secondary to stage IV infected sacral ulceration  Chronic atrial fibrillation  Hx: HTN  Continuous telemetry monitoring  Aggressive fluid resuscitation and prn levophed gtt to maintain map >65 Continue aspirin  Trend WBC and monitor fever curve Trend PCT  Follow cultures  Continue cefepime, flagyl, and vancomycin  Wound care consulted appreciate input  General Surgery consulted appreciate input: pt pending debridement of sacral wound  Prn morphine for pain management   Acute on chronic renal failure with metabolic acidosis  Urinary retention requiring chronic foley  Trend BMP  Replace electrolytes as indicated  Monitor UOP Avoid nephrotoxic medications  Administer 2 amps of sodium bicarb  Anemia of chronic kidney disease  Trend CBC Monitor for s/sx of bleeding and transfuse for hgb <7  Type II Diabetes Mellitus  CBG's ac/hs SSI   Marda Stalker, [place] Pager [phone] (please enter 7 digits) PCCM Consult Pager [phone] (please enter 7 digits)

## 2019-07-03 NOTE — Consult Note (Signed)
WOC Nurse Consult Note: Reason for Consult:UNstageable pressure injury sacrum and left ischial tuberosity.  Patient is septic and is having surgery to debride devitalized tissue.  Will defer to surgical services.  Please reconsult if needed. Wound type:unstageable pressure injury Pressure Injury POA: Yes Will not follow at this time.  Please re-consult if needed.  Domenic Moras MSN, RN, FNP-BC CWON Wound, Ostomy, Continence Nurse Pager [phone]

## 2019-07-03 NOTE — H&P (Addendum)
History and Physical    Edward Strong [id number] DOB: December 23, 1929 DOA: 07/02/2019  PCP: Toni Arthurs, NP  Patient coming from: [facility]  I have personally briefly reviewed patient's old medical records in [place]  Chief Complaint: Sacral ulcer  HPI: [name] is a 86 y.o. male with medical history significant for insulin-dependent type 2 diabetes, CKD stage IIIa, chronic atrial fibrillation on Eliquis, CAD, chronic systolic heart failure, history of Hodgkin's lymphoma who presented from nursing facility for concerns of worsening sacral ulcer. Patient is a poor historian and is not sure why he is here.  History obtained entirely from ER physician report and documentation.  Patient was recently hospitalized from 2/3-2/8 for AKI secondary to urinary retention and UTI and was noted to have sacral pressure injury at that time.  He was evaluated by outpatient wound care today and was noted to have grossly necrotic stage IV wound over the sacrum.  He was then sent for evaluation to the ER for concerns of osteomyelitis.  ED Course: He was afebrile and normotensive on room air.  He had leukocytosis of 13.8.  Lactate of initially 3.2 and then 2.1. creatinine was elevated to 3.13 from a baseline of about 2.7.   X-ray of the sacrum showed soft tissue ulceration dorsal to the distal sacrum and coccyx without radiographic evidence of osteomyelitis. General surgery was consulted by ED physician and recommended debridement in the morning and to hold Eliquis overnight.    Review of Systems:  Unable to obtain given pt's is a poor historian  Past Medical History:  Diagnosis Date  . Atrial fibrillation ([place])   . CKD (chronic kidney disease), stage IIIa   . HTN (hypertension)   . Type II diabetes mellitus with renal manifestations [facility])     Past Surgical History:  Procedure Laterality Date  . APPENDECTOMY    . ORIF FEMUR FRACTURE Left 05/13/2019   Procedure: OPEN REDUCTION  INTERNAL FIXATION (ORIF) of closed  left periprosthetic supracondylar femur fracture.;  Surgeon: Corky Mull, MD;  Location: [facility] ORS;  Service: Orthopedics;  Laterality: Left;  . REPLACEMENT TOTAL KNEE Left      reports that he has quit smoking. He has never used smokeless tobacco. He reports previous drug use. He reports that he does not drink alcohol.  Allergies  Allergen Reactions  . Bee Venom Other (See Comments)  . Ezetimibe Other (See Comments)  . Fenofibrate Micronized Other (See Comments)  . Pravastatin Other (See Comments)  . Sulfa Antibiotics Other (See Comments)    Unable to obtain family hx from patient due to him being poor historian  Prior to Admission medications   Medication Sig Start Date End Date Taking? Authorizing Provider  apixaban (ELIQUIS) 2.5 MG TABS tablet Take 2.5 mg by mouth 2 (two) times daily.   Yes [provider]  ascorbic acid (VITAMIN C) 500 MG tablet Take 500 mg by mouth 2 (two) times daily.   Yes [provider]  aspirin 81 MG chewable tablet Chew 81 mg by mouth daily.   Yes [provider]  bisacodyl (DULCOLAX) 10 MG suppository Place 1 suppository (10 mg total) rectally daily as needed for moderate constipation. 05/16/19  Yes Dhungel, Nishant, MD  Ensure Max Protein (ENSURE MAX PROTEIN) LIQD Take 330 mLs (11 oz total) by mouth 2 (two) times daily between meals. 05/16/19  Yes Dhungel, Nishant, MD  ferrous sulfate 325 (65 FE) MG tablet Take 1 tablet (325 mg total) by mouth 2 (  two) times daily with a meal. 05/16/19 05/15/20 Yes Dhungel, Nishant, MD  furosemide (LASIX) 40 MG tablet Take 1 tablet (40 mg total) by mouth daily as needed for fluid or edema. 06/16/19  Yes Lorella Nimrod, MD  insulin NPH Human (NOVOLIN N) 100 UNIT/ML injection Inject 0.2 mLs (20 Units total) into the skin every evening. Patient taking differently: Inject 30 Units into the skin at bedtime.  05/16/19  Yes Dhungel, Nishant, MD  insulin regular (NOVOLIN R) 100  units/mL injection Inject 0.05 mLs (5 Units total) into the skin 3 (three) times daily before meals. Patient taking differently: Inject 10 Units into the skin 3 (three) times daily before meals.  05/16/19  Yes Dhungel, Nishant, MD  polyethylene glycol (MIRALAX / GLYCOLAX) 17 g packet Take 17 g by mouth daily. 05/17/19  Yes Dhungel, Nishant, MD  therapeutic multivitamin-minerals (THERAGRAN-M) tablet Take 1 tablet by mouth every evening.   Yes [provider]  zinc sulfate 220 (50 Zn) MG capsule Take 220 mg by mouth every evening.   Yes [provider]    Physical Exam: Vitals:   07/02/19 2200 07/03/19 0000 07/03/19 0030 07/03/19 0130  BP: (!) 74/49 109/69 (!) 108/45 118/68  Pulse:  67 74 74  Resp: 15 19 17 20   Temp:    98.3 F (36.8 C)  TempSrc:    Oral  SpO2:  98% 98% 98%  Weight:    89.1 kg  Height:    5\' 11"  (1.803 m)    Constitutional: NAD, calm, comfortable, nontoxic appearing male laying at 30 degree incline in bed Vitals:   07/02/19 2200 07/03/19 0000 07/03/19 0030 07/03/19 0130  BP: (!) 74/49 109/69 (!) 108/45 118/68  Pulse:  67 74 74  Resp: 15 19 17 20   Temp:    98.3 F (36.8 C)  TempSrc:    Oral  SpO2:  98% 98% 98%  Weight:    89.1 kg  Height:    5\' 11"  (1.803 m)   Eyes: PERRL, lids and conjunctivae normal ENMT: Mucous membranes are moist.  Neck: normal, supple Respiratory: clear to auscultation bilaterally, no wheezing, no crackles. Normal respiratory effort. No accessory muscle use.  Cardiovascular: Regular rate and rhythm, no murmurs / rubs / gallops. No extremity edema.  Abdomen: no tenderness, no masses palpated.Bowel sounds positive.  Back: Deep stage IV sacral ulcer with packing dressing and malodorous odor Musculoskeletal: no clubbing / cyanosis. No joint deformity upper and lower extremities. Good ROM, no contractures. Normal muscle tone.  Skin: no rashes, lesions, ulcers. No induration Neurologic: CN 2-12 grossly intact. Sensation intact.  Strength 5/5 in all 4.  Psychiatric: Alert and oriented only to self. Normal mood.     Labs on Admission: I have personally reviewed following labs and imaging studies  CBC: Recent Labs  Lab 07/02/19 1628  WBC 13.8*  NEUTROABS 10.8*  HGB 10.9*  HCT 34.6*  MCV 105.8*  PLT 0000000   Basic Metabolic Panel: Recent Labs  Lab 07/02/19 1628  NA 143  K 4.6  CL 117*  CO2 15*  GLUCOSE 102*  BUN 74*  CREATININE 3.13*  CALCIUM 8.5*   GFR: Estimated Creatinine Clearance: 17.7 mL/min (A) (by C-G formula based on SCr of 3.13 mg/dL (H)). Liver Function Tests: Recent Labs  Lab 07/02/19 1628  AST 41  ALT 37  ALKPHOS 190*  BILITOT 0.7  PROT 6.6  ALBUMIN 2.2*   No results for input(s): LIPASE, AMYLASE in the last 168 hours. No results for  input(s): AMMONIA in the last 168 hours. Coagulation Profile: No results for input(s): INR, PROTIME in the last 168 hours. Cardiac Enzymes: No results for input(s): CKTOTAL, CKMB, CKMBINDEX, TROPONINI in the last 168 hours. BNP (last 3 results) No results for input(s): PROBNP in the last 8760 hours. HbA1C: No results for input(s): HGBA1C in the last 72 hours. CBG: Recent Labs  Lab 07/02/19 2025 07/02/19 2046 07/02/19 2213 07/03/19 0124  GLUCAP 42* 111* 141* 152*   Lipid Profile: No results for input(s): CHOL, HDL, LDLCALC, TRIG, CHOLHDL, LDLDIRECT in the last 72 hours. Thyroid Function Tests: No results for input(s): TSH, T4TOTAL, FREET4, T3FREE, THYROIDAB in the last 72 hours. Anemia Panel: No results for input(s): VITAMINB12, FOLATE, FERRITIN, TIBC, IRON, RETICCTPCT in the last 72 hours. Urine analysis:    Component Value Date/Time   COLORURINE YELLOW (A) 07/02/2019 1740   APPEARANCEUR CLOUDY (A) 07/02/2019 1740   LABSPEC 1.018 07/02/2019 1740   PHURINE 5.0 07/02/2019 1740   GLUCOSEU 150 (A) 07/02/2019 1740   HGBUR NEGATIVE 07/02/2019 1740   BILIRUBINUR NEGATIVE 07/02/2019 1740   KETONESUR NEGATIVE 07/02/2019 1740    PROTEINUR 30 (A) 07/02/2019 1740   NITRITE NEGATIVE 07/02/2019 1740   LEUKOCYTESUR SMALL (A) 07/02/2019 1740    Radiological Exams on Admission: DG Sacrum/Coccyx  Result Date: 07/02/2019 CLINICAL DATA:  Deep sacral wound. Concern for osteomyelitis. EXAM: SACRUM AND COCCYX - 2+ VIEW COMPARISON:  None. FINDINGS: On the lateral view, there is apparent soft tissue ulceration dorsal to the distal sacrum and coccyx. No foreign body or definite bone destruction is seen in this area. Osseous evaluation is limited by demineralization and overlying stool on the AP views. The hip and sacroiliac joint spaces are preserved. There are prominent endplate osteophytes in the lower lumbar spine. IMPRESSION: Soft tissue ulceration dorsal to the distal sacrum and coccyx without radiographic evidence of osteomyelitis. Osseous evaluation is limited by demineralization and overlying stool. Electronically Signed   By: Richardean Sale M.D.   On: 07/02/2019 17:40    EKG: Independently reviewed.   Assessment/Plan  Sepsis shock secondary to stage IV sacral ulcer Patient later developed hypotension despite 4L of IV fluids following admission and was started on Levophed by ED physician Critical care was then consulted and will assume care of patient continue IV vancomycin, cefepime and Flagyl General surgery consulted and will do debridement in the morning.  Will hold Eliquis  Acute on chronic kidney disease stage IIIa Monitor closely with fluids Avoid nephrotoxic agent  Chronic atrial fibrillation Hold Eliquis for debridement general surgery in the morning  Chronic systolic heart failure Euvolemic on exam.  Will need to be cautious with fluid in the setting of sepsis.  Insulin-dependent type 2 diabetes On NPH 30 units nightly and Novolin R 5 units TID BG at 160- start sensitive sliding scale for now  DVT prophylaxis:SCDs Code Status: Full Family Communication: No family at bedside disposition Plan: Home  with at least 2 midnight stays  Consults called:  Admission status: inpatient with at least 2 midnight stay given need for vasopressor and critical care consult due to hypotension from sepsis   [facility]   If 7PM-7AM, please contact night-coverage [email or web address]   07/03/2019, 1:43 AM

## 2019-07-03 NOTE — Progress Notes (Signed)
Clinical status relayed to family(Son [name] over the phone)  Updated and notified of patients medical condition- Severe septic shock with severe sacral wound with OSTEO  Family understands the very poor situation.  They have consented and agreed to DNR/DNI     Family are satisfied with Plan of action and management. All questions answered  Corrin Parker, M.D.  [facility]  Medical Director [place] Director [facility] Cardio-Pulmonary Department

## 2019-07-03 NOTE — Consult Note (Signed)
Consultation Note Date: 07/03/2019   Patient Name: Edward Strong  DOB: Jan 26, 1930  MRN: [id number]  Age / Sex: 86 y.o., male  PCP: Toni Arthurs, NP Referring Physician: Flora Lipps, MD  Reason for Consultation: Establishing goals of care  HPI/Patient Profile: 86 y.o. male  with past medical history of T2DM, CKD 3, a fib on eliquis, CAD, systolic heart failure, Hodgkins lymphoma, and urinary retention admitted on 07/02/2019 with sacral ulcer and concern for osteomyelitis. He was evaluated by wound care on day of admission and found to have grossly necrotic stage 4 wound over his sacrum. He was hospitalized 2/3-2/8 for AKI. Also had femur fracture Jan 2021 that required ORIF. PMT consulted for [place].   Clinical Assessment and Goals of Care: I have reviewed medical records including EPIC notes, labs and imaging, received report from Dr. Mortimer Fries, assessed the patient and then met with patient's son and daughter in law to discuss diagnosis prognosis, GOC, EOL wishes, disposition and options.  I introduced Palliative Medicine as specialized medical care for people living with serious illness. It focuses on providing relief from the symptoms and stress of a serious illness. The goal is to improve quality of life for both the patient and the family.  They tell me prior to January patient was living alone and independent. He fractured his femur in January and has been in different rehab facilities since that time and has been declining. They are unsure about his appetite but says he always tells them he eats well. They tell me that prior to Jan patient's mind was stable - since his stays in rehab he has become more confused but still typically oriented - just repeated himself often and had some short term memory loss. His mental status we are seeing here in the hospital is not his baseline.   We discussed his current illness and what it means in the larger context  of his on-going co-morbidities.  Natural disease trajectory and expectations at EOL were discussed. Family is aware that patient is very ill. Discussed his current need for vasopressors d/t shock - discussed need to be able to wean patient off of these. Family upset about care at facilities and wonder if neglect led to patient's state. We discussed that other factors contribute such as poor functional status and low albumin (2.2 today). Also discussed that low albumin contributes to poor wound healing.   I attempted to elicit values and goals of care important to the patient.  They do share that patient has been very clear in the past that he would not want his life prolonged artificially. They endorse DNR status.   Family is agreeable to ongoing support of PMT and "taking it one day at a time". Will follow up tomorrow and continue goals of care discussions - family does have understanding of poor prognosis.   Questions and concerns were addressed. The family was encouraged to call with questions or concerns.   Primary Decision Maker NEXT OF KIN - Son Falon Flinchum    SUMMARY OF RECOMMENDATIONS   - discussion with family about patient's condition/poor prognosis/concern about poor outcomes - they have good understanding of this - DNR status confirmed and supported - Agreeable to ongoing PMT support throughout hospitalization - will see tomorrow and continue [place]  Code Status/Advance Care Planning:  DNR   Symptom Management:   Patient denies pain/discomfort during my visit - resting well   Defer to primary  Prognosis:   Unable to determine - poor prognosis  r/t septic shock, sacral wound with osteomyelitis, albumin 2.2, poor baseline functional status  Discharge Planning: To Be Determined      Primary Diagnoses: Present on Admission: . Sacral ulcer ([place]) . Sepsis ([place]) . Type II diabetes mellitus with renal manifestations ([place]) . Atrial fibrillation ([place]) . Chronic systolic CHF  (congestive heart failure) ([place]) . CKD (chronic kidney disease), stage IIIa . AKI (acute kidney injury) ([place])   I have reviewed the medical record, interviewed the patient and family, and examined the patient. The following aspects are pertinent.  Past Medical History:  Diagnosis Date  . Atrial fibrillation ([place])   . CKD (chronic kidney disease), stage IIIa   . HTN (hypertension)   . Type II diabetes mellitus with renal manifestations [facility])    Social History   Socioeconomic History  . Marital status: Widowed    Spouse name: Not on file  . Number of children: Not on file  . Years of education: Not on file  . Highest education level: Not on file  Occupational History  . Not on file  Tobacco Use  . Smoking status: Former Research scientist (life sciences)  . Smokeless tobacco: Never Used  Substance and Sexual Activity  . Alcohol use: Never  . Drug use: Not Currently  . Sexual activity: Not on file  Other Topics Concern  . Not on file  Social History Narrative  . Not on file   Social Determinants of Health   Financial Resource Strain:   . Difficulty of Paying Living Expenses: Not on file  Food Insecurity:   . Worried About Charity fundraiser in the Last Year: Not on file  . Ran Out of Food in the Last Year: Not on file  Transportation Needs:   . Lack of Transportation (Medical): Not on file  . Lack of Transportation (Non-Medical): Not on file  Physical Activity:   . Days of Exercise per Week: Not on file  . Minutes of Exercise per Session: Not on file  Stress:   . Feeling of Stress : Not on file  Social Connections:   . Frequency of Communication with Friends and Family: Not on file  . Frequency of Social Gatherings with Friends and Family: Not on file  . Attends Religious Services: Not on file  . Active Member of Clubs or Organizations: Not on file  . Attends Archivist Meetings: Not on file  . Marital Status: Not on file   History reviewed. No pertinent family  history. Scheduled Meds: . [MAR Hold] aspirin  81 mg Oral Daily  . [MAR Hold] Chlorhexidine Gluconate Cloth  6 each Topical Daily  . [MAR Hold] ferrous sulfate  325 mg Oral BID WC  . [MAR Hold] insulin aspart  0-9 Units Subcutaneous TID WC  . [MAR Hold] mupirocin ointment   Nasal BID  . [MAR Hold] pantoprazole (PROTONIX) IV  40 mg Intravenous QHS  . [MAR Hold] vancomycin variable dose per unstable renal function (pharmacist dosing)   Does not apply See admin instructions   Continuous Infusions: . [MAR Hold] ceFEPime (MAXIPIME) IV    . lactated ringers 50 mL/hr at 07/03/19 1141  . [MAR Hold] metronidazole Stopped (07/03/19 1143)  . norepinephrine (LEVOPHED) Adult infusion 14 mcg/min (07/03/19 1238)   PRN Meds:.fentaNYL (SUBLIMAZE) injection, [MAR Hold]  morphine injection, oxyCODONE **OR** oxyCODONE Allergies  Allergen Reactions  . Bee Venom Other (See Comments)  . Ezetimibe Other (See Comments)  . Fenofibrate Micronized Other (See Comments)  . Pravastatin Other (See Comments)  .  Sulfa Antibiotics Other (See Comments)   Review of Systems  Unable to perform ROS: Mental status change    Physical Exam Constitutional:      General: He is not in acute distress. HENT:     Head: Normocephalic and atraumatic.  Cardiovascular:     Rate and Rhythm: Normal rate.  Pulmonary:     Effort: Pulmonary effort is normal. No respiratory distress.  Abdominal:     Palpations: Abdomen is soft.  Musculoskeletal:     Comments: LLE in splint  Skin:    General: Skin is warm and dry.  Neurological:     Mental Status: He is alert. He is disoriented.     Vital Signs: BP (!) 116/52   Pulse (!) 41   Temp 97.7 F (36.5 C)   Resp 15   Ht 5' 11"  (1.803 m)   Wt 89.1 kg   SpO2 97%   BMI 27.40 kg/m  Pain Scale: 0-10   Pain Score: 0-No pain   SpO2: SpO2: 97 % O2 Device:SpO2: 97 % O2 Flow Rate: .O2 Flow Rate (L/min): 6 L/min  IO: Intake/output summary:   Intake/Output Summary (Last 24  hours) at 07/03/2019 1418 Last data filed at 07/03/2019 1318 Gross per 24 hour  Intake 5835.45 ml  Output 1150 ml  Net 4685.45 ml    LBM: Last BM Date: 07/03/19 Baseline Weight: Weight: 104.3 kg Most recent weight: Weight: 89.1 kg     Palliative Assessment/Data: PPS 40%    Time Total: 70 minutes Greater than 50%  of this time was spent counseling and coordinating care related to the above assessment and plan.  Juel Burrow, DNP, AGNP-C Palliative Medicine Team [phone] Pager: [phone]

## 2019-07-03 NOTE — Progress Notes (Addendum)
Pharmacy Antibiotic Note  Edward Strong is a 86 y.o. male admitted on 07/02/2019 with  decubitus sacral ulcer wound .  Pharmacy has been consulted for vanc/cefepime dosing. Patient received vanc 2g IV and cefepime 2g IV x 1 in ED.  Plan: 02/25 @ 0500 VR 16 mcg/mL appropriate. Will give vanc 1.25g IV x 1 and will recheck random level w/ am labs. Renal function improving, but still not at baseline will continue to monitor   Height: 5\' 11"  (180.3 cm) Weight: 196 lb 6.9 oz (89.1 kg) IBW/kg (Calculated) : 75.3  Temp (24hrs), Avg:98 F (36.7 C), Min:97.6 F (36.4 C), Max:98.3 F (36.8 C)  Recent Labs  Lab 07/02/19 1628 07/02/19 1856 07/03/19 0510  WBC 13.8*  --  15.1*  CREATININE 3.13*  --  2.67*  LATICACIDVEN 3.2* 2.1*  --   VANCORANDOM  --   --  16    Estimated Creatinine Clearance: 20.8 mL/min (A) (by C-G formula based on SCr of 2.67 mg/dL (H)).    Allergies  Allergen Reactions  . Bee Venom Other (See Comments)  . Ezetimibe Other (See Comments)  . Fenofibrate Micronized Other (See Comments)  . Pravastatin Other (See Comments)  . Sulfa Antibiotics Other (See Comments)   Thank you for allowing pharmacy to be a part of this patient's care.  Tobie Lords, PharmD, BCPS Clinical Pharmacist 07/03/2019 6:37 AM

## 2019-07-03 NOTE — ED Notes (Signed)
Report called to [facility], pt admitted to ccu 13

## 2019-07-04 ENCOUNTER — Inpatient Hospital Stay: Payer: Medicare Other

## 2019-07-04 ENCOUNTER — Inpatient Hospital Stay (HOSPITAL_COMMUNITY)
Admit: 2019-07-04 | Discharge: 2019-07-04 | Disposition: A | Discharge status: Home / Self Care | Payer: Medicare Other | Attending: Pulmonary Disease | Admitting: Pulmonary Disease

## 2019-07-04 ENCOUNTER — Inpatient Hospital Stay: Payer: Self-pay

## 2019-07-04 DIAGNOSIS — N179 Acute kidney failure, unspecified: Secondary | ICD-10-CM

## 2019-07-04 DIAGNOSIS — I5031 Acute diastolic (congestive) heart failure: Secondary | ICD-10-CM

## 2019-07-04 LAB — CBC
HCT: 27.8 % — ABNORMAL LOW (ref 39.0–52.0)
Hemoglobin: 8.7 g/dL — ABNORMAL LOW (ref 13.0–17.0)
MCH: 33.3 pg (ref 26.0–34.0)
MCHC: 31.3 g/dL (ref 30.0–36.0)
MCV: 106.5 fL — ABNORMAL HIGH (ref 80.0–100.0)
Platelets: 290 10*3/uL (ref 150–400)
RBC: 2.61 MIL/uL — ABNORMAL LOW (ref 4.22–5.81)
RDW: 14.8 % (ref 11.5–15.5)
WBC: 11.1 10*3/uL — ABNORMAL HIGH (ref 4.0–10.5)
nRBC: 0.3 % — ABNORMAL HIGH (ref 0.0–0.2)

## 2019-07-04 LAB — BASIC METABOLIC PANEL
Anion gap: 6 (ref 5–15)
BUN: 67 mg/dL — ABNORMAL HIGH (ref 8–23)
CO2: 18 mmol/L — ABNORMAL LOW (ref 22–32)
Calcium: 7.8 mg/dL — ABNORMAL LOW (ref 8.9–10.3)
Chloride: 122 mmol/L — ABNORMAL HIGH (ref 98–111)
Creatinine, Ser: 2.66 mg/dL — ABNORMAL HIGH (ref 0.61–1.24)
GFR calc Af Amer: 24 mL/min — ABNORMAL LOW (ref >60)
GFR calc non Af Amer: 21 mL/min — ABNORMAL LOW (ref >60)
Glucose, Bld: 213 mg/dL — ABNORMAL HIGH (ref 70–99)
Potassium: 4.9 mmol/L (ref 3.5–5.1)
Sodium: 146 mmol/L — ABNORMAL HIGH (ref 135–145)

## 2019-07-04 LAB — GLUCOSE, CAPILLARY
Glucose-Capillary: 175 mg/dL — ABNORMAL HIGH (ref 70–99)
Glucose-Capillary: 207 mg/dL — ABNORMAL HIGH (ref 70–99)
Glucose-Capillary: 233 mg/dL — ABNORMAL HIGH (ref 70–99)
Glucose-Capillary: 234 mg/dL — ABNORMAL HIGH (ref 70–99)
Glucose-Capillary: 248 mg/dL — ABNORMAL HIGH (ref 70–99)
Glucose-Capillary: 313 mg/dL — ABNORMAL HIGH (ref 70–99)
Glucose-Capillary: 333 mg/dL — ABNORMAL HIGH (ref 70–99)

## 2019-07-04 LAB — ECHOCARDIOGRAM COMPLETE
Height: 71 in
Weight: 3142.88 oz

## 2019-07-04 LAB — PROCALCITONIN: Procalcitonin: 0.27 ng/mL

## 2019-07-04 MED ORDER — APIXABAN 2.5 MG PO TABS
2.5000 mg | ORAL_TABLET | Freq: Two times a day (BID) | ORAL | Status: DC
  Administered 2019-07-04 – 2019-07-09 (×10): 2.5 mg via ORAL
  Filled 2019-07-04 (×9): qty 1

## 2019-07-04 MED ORDER — SODIUM CHLORIDE 0.9 % IV SOLN
250.0000 mL | INTRAVENOUS | Status: DC
  Administered 2019-07-04: 250 mL via INTRAVENOUS

## 2019-07-04 MED ORDER — SODIUM CHLORIDE 0.9% FLUSH: 10.0000 mL | INTRAVENOUS | Status: DC | PRN

## 2019-07-04 MED ORDER — NOREPINEPHRINE 16 MG/250ML-% IV SOLN
0.0000 ug/min | INTRAVENOUS | Status: DC
  Administered 2019-07-04: 12 ug/min via INTRAVENOUS
  Filled 2019-07-04 (×2): qty 250

## 2019-07-04 MED ORDER — INSULIN ASPART 100 UNIT/ML ~~LOC~~ SOLN
10.0000 [IU] | Freq: Once | SUBCUTANEOUS | Status: AC
  Administered 2019-07-04: 10 [IU] via SUBCUTANEOUS

## 2019-07-04 MED ORDER — INSULIN GLARGINE 100 UNIT/ML ~~LOC~~ SOLN
10.0000 [IU] | Freq: Every day | SUBCUTANEOUS | Status: DC
  Administered 2019-07-04 – 2019-07-06 (×3): 10 [IU] via SUBCUTANEOUS
  Filled 2019-07-04 (×4): qty 0.1

## 2019-07-04 MED ORDER — HYDROCODONE-ACETAMINOPHEN 5-325 MG PO TABS
1.0000 | ORAL_TABLET | Freq: Four times a day (QID) | ORAL | Status: DC | PRN
  Administered 2019-07-04 – 2019-07-05 (×2): 1 via ORAL
  Filled 2019-07-04 (×2): qty 1

## 2019-07-04 MED ORDER — LACTATED RINGERS IV BOLUS
1000.0000 mL | Freq: Once | INTRAVENOUS | Status: AC
  Administered 2019-07-04: 1000 mL via INTRAVENOUS

## 2019-07-04 MED ORDER — ALBUMIN HUMAN 25 % IV SOLN
12.5000 g | Freq: Every day | INTRAVENOUS | Status: AC
  Administered 2019-07-04 – 2019-07-06 (×3): 12.5 g via INTRAVENOUS
  Filled 2019-07-04 (×3): qty 50

## 2019-07-04 MED ORDER — MIDODRINE HCL 5 MG PO TABS
5.0000 mg | ORAL_TABLET | Freq: Three times a day (TID) | ORAL | Status: DC
  Administered 2019-07-04 – 2019-07-08 (×12): 5 mg via ORAL
  Filled 2019-07-04 (×13): qty 1

## 2019-07-04 MED ORDER — APIXABAN 2.5 MG PO TABS
2.5000 mg | ORAL_TABLET | Freq: Two times a day (BID) | ORAL | Status: DC
  Filled 2019-07-04: qty 1

## 2019-07-04 MED ORDER — INSULIN ASPART 100 UNIT/ML ~~LOC~~ SOLN: 0.0000 [IU] | SUBCUTANEOUS | Status: DC

## 2019-07-04 MED ORDER — VANCOMYCIN HCL IN DEXTROSE 1-5 GM/200ML-% IV SOLN
1000.0000 mg | INTRAVENOUS | Status: DC
  Administered 2019-07-04 – 2019-07-07 (×3): 1000 mg via INTRAVENOUS
  Filled 2019-07-04 (×3): qty 200

## 2019-07-04 MED ORDER — ALBUTEROL SULFATE (2.5 MG/3ML) 0.083% IN NEBU
2.5000 mg | INHALATION_SOLUTION | Freq: Four times a day (QID) | RESPIRATORY_TRACT | Status: DC
  Administered 2019-07-04 (×2): 2.5 mg via RESPIRATORY_TRACT
  Filled 2019-07-04 (×2): qty 3

## 2019-07-04 MED ORDER — INSULIN ASPART 100 UNIT/ML ~~LOC~~ SOLN
0.0000 [IU] | SUBCUTANEOUS | Status: DC
  Administered 2019-07-04 (×2): 7 [IU] via SUBCUTANEOUS
  Administered 2019-07-04: 4 [IU] via SUBCUTANEOUS
  Administered 2019-07-04 – 2019-07-05 (×2): 7 [IU] via SUBCUTANEOUS
  Administered 2019-07-05: 4 [IU] via SUBCUTANEOUS
  Administered 2019-07-05 (×3): 7 [IU] via SUBCUTANEOUS
  Administered 2019-07-05 – 2019-07-06 (×7): 4 [IU] via SUBCUTANEOUS
  Administered 2019-07-07: 3 [IU] via SUBCUTANEOUS
  Administered 2019-07-07 (×3): 4 [IU] via SUBCUTANEOUS
  Administered 2019-07-07: 7 [IU] via SUBCUTANEOUS
  Administered 2019-07-07: 1 [IU] via SUBCUTANEOUS
  Administered 2019-07-08: 3 [IU] via SUBCUTANEOUS
  Administered 2019-07-08 (×2): 4 [IU] via SUBCUTANEOUS
  Administered 2019-07-08: 3 [IU] via SUBCUTANEOUS
  Filled 2019-07-04 (×25): qty 1

## 2019-07-04 MED ORDER — INSULIN GLARGINE 100 UNIT/ML ~~LOC~~ SOLN
10.0000 [IU] | Freq: Every day | SUBCUTANEOUS | Status: DC
  Administered 2019-07-04: 10 [IU] via SUBCUTANEOUS
  Filled 2019-07-04: qty 0.1

## 2019-07-04 MED ORDER — SODIUM CHLORIDE 0.9% FLUSH
10.0000 mL | Freq: Two times a day (BID) | INTRAVENOUS | Status: DC
  Administered 2019-07-04: 40 mL
  Administered 2019-07-05 – 2019-07-06 (×2): 10 mL
  Administered 2019-07-06: 40 mL
  Administered 2019-07-07: 10 mL
  Administered 2019-07-07: 20 mL
  Administered 2019-07-08 (×2): 10 mL
  Administered 2019-07-09: 20 mL
  Administered 2019-07-09 – 2019-07-11 (×3): 10 mL

## 2019-07-04 MED ORDER — INSULIN GLARGINE 100 UNIT/ML ~~LOC~~ SOLN
10.0000 [IU] | Freq: Every day | SUBCUTANEOUS | Status: DC
  Filled 2019-07-04: qty 0.1

## 2019-07-04 NOTE — Progress Notes (Signed)
Spoke with Raquel Sarna, RN regarding PICC order.  PICC will not be able to be placed until after 7 pm.  Raquel Sarna will notify MD if further actions need to be taken prior to this time.

## 2019-07-04 NOTE — Progress Notes (Signed)
[place] Hospital Day(s): 2.   Post op day(s): 1 Day Post-Op.   Interval History: Patient seen and examined, no acute events or new complaints overnight. Patient reports feeling well. He was having breakfast without any issues. Nurse denies any problem with the Wound VAC overnight.    Vital signs in last 24 hours: [min-max] current  Temp:  [97.5 F (36.4 C)-98 F (36.7 C)] 97.5 F (36.4 C) (02/26 0715) Pulse Rate:  [27-133] 44 (02/26 0945) Resp:  [12-22] 17 (02/26 1215) BP: (80-130)/(33-108) 100/49 (02/26 1215) SpO2:  [82 %-100 %] 90 % (02/26 0945) FiO2 (%):  [21 %] 21 % (02/25 1328)     Height: 5\' 11"  (180.3 cm) Weight: 89.1 kg BMI (Calculated): 27.41   Physical Exam:  Constitutional: alert, cooperative and no distress  Back: wound vac in place. Skin around the wound VAC looks normal.   Labs:  CBC Latest Ref Rng & Units 07/04/2019 07/03/2019 07/02/2019  WBC 4.0 - 10.5 K/uL 11.1(H) 15.1(H) 13.8(H)  Hemoglobin 13.0 - 17.0 g/dL 8.7(L) 9.3(L) 10.9(L)  Hematocrit 39.0 - 52.0 % 27.8(L) 28.7(L) 34.6(L)  Platelets 150 - 400 K/uL 290 291 333   CMP Latest Ref Rng & Units 07/04/2019 07/03/2019 07/02/2019  Glucose 70 - 99 mg/dL 213(H) 174(H) 102(H)  BUN 8 - 23 mg/dL 67(H) 68(H) 74(H)  Creatinine 0.61 - 1.24 mg/dL 2.66(H) 2.67(H) 3.13(H)  Sodium 135 - 145 mmol/L 146(H) 145 143  Potassium 3.5 - 5.1 mmol/L 4.9 4.7 4.6  Chloride 98 - 111 mmol/L 122(H) 119(H) 117(H)  CO2 22 - 32 mmol/L 18(L) 16(L) 15(L)  Calcium 8.9 - 10.3 mg/dL 7.8(L) 7.9(L) 8.5(L)  Total Protein 6.5 - 8.1 g/dL - - 6.6  Total Bilirubin 0.3 - 1.2 mg/dL - - 0.7  Alkaline Phos 38 - 126 U/L - - 190(H)  AST 15 - 41 U/L - - 41  ALT 0 - 44 U/L - - 37    Imaging studies: No new pertinent imaging studies   Assessment/Plan:  86 y.o. male with infected stage 4 sacral ulcer 1 Day Post-Op s/p cleansing and debridement and negative pressure dressing, complicated by pertinent comorbidities including a fib on  anticoagulation, htn, CKD stage 3a, diabetes mellitus type 2.  Patient without any deterioration after cleansing and debridement yesterday.  Wound VAC dressing in place without any issues.  White blood cell count slowly decreasing.  Patient still with Levophed.  Otherwise patient is alert and recovering slowly.  I will change negative pressure dressing tomorrow.  No contraindication for anticoagulation.  We will continue management by ICU team.  Arnold Long, MD

## 2019-07-04 NOTE — Progress Notes (Signed)
*  PRELIMINARY RESULTS* Echocardiogram 2D Echocardiogram has been performed.  Edward Strong 07/04/2019, 2:22 PM

## 2019-07-04 NOTE — Progress Notes (Signed)
CRITICAL CARE PROGRESS NOTE    Name: ERRIC BALSIGER MRN: [id number] DOB: 1929/08/09     LOS: 2   SUBJECTIVE FINDINGS & SIGNIFICANT EVENTS   Patient description:  86 yo male admitted with acute on chronic renal failure with metabolic acidosis/lactic acidosis and septic shock requiring levophed gtt secondary to stage IV sacral ulcer   Lines / Drains: PIV x2  Cultures / Sepsis markers: Blood cultures, COVID-19, nasal MRSA PCR positive Wound culture with multispecies results in process  Antibiotics: Cefepime, Flagyl, vancomycin per pharmacist consultation   Protocols / Consultants: Surgery, PCCM, hospitalist, pharmacy   PAST MEDICAL HISTORY   Past Medical History:  Diagnosis Date  . Atrial fibrillation ([place])   . CKD (chronic kidney disease), stage IIIa   . HTN (hypertension)   . Type II diabetes mellitus with renal manifestations [facility])      SURGICAL HISTORY   Past Surgical History:  Procedure Laterality Date  . APPENDECTOMY    . APPLICATION OF WOUND VAC N/A 07/03/2019   Procedure: APPLICATION OF WOUND VAC;  Surgeon: Herbert Pun, MD;  Location: [facility] ORS;  Service: General;  Laterality: [id number]  . INCISION AND DRAINAGE OF WOUND N/A 07/03/2019   Procedure: IRRIGATION AND DEBRIDEMENT SACRAL ULCER;  Surgeon: Herbert Pun, MD;  Location: [facility] ORS;  Service: General;  Laterality: N/A;  . ORIF FEMUR FRACTURE Left 05/13/2019   Procedure: OPEN REDUCTION INTERNAL FIXATION (ORIF) of closed  left periprosthetic supracondylar femur fracture.;  Surgeon: Corky Mull, MD;  Location: [facility] ORS;  Service: Orthopedics;  Laterality: Left;  . REPLACEMENT TOTAL KNEE Left      FAMILY HISTORY   History reviewed. No pertinent family history.   SOCIAL HISTORY   Social History   Tobacco  Use  . Smoking status: Former Research scientist (life sciences)  . Smokeless tobacco: Never Used  Substance Use Topics  . Alcohol use: Never  . Drug use: Not Currently     MEDICATIONS   Current Medication:  Current Facility-Administered Medications:  .  aspirin chewable tablet 81 mg, 81 mg, Oral, Daily, Herbert Pun, MD, 81 mg at 07/04/19 0828 .  ceFEPIme (MAXIPIME) 2 g in sodium chloride 0.9 % 100 mL IVPB, 2 g, Intravenous, Q24H, Herbert Pun, MD, Stopped at 07/03/19 1913 .  Chlorhexidine Gluconate Cloth 2 % PADS 6 each, 6 each, Topical, Daily, Herbert Pun, MD, 6 each at 07/03/19 1044 .  ferrous sulfate tablet 325 mg, 325 mg, Oral, BID WC, Cintron-Diaz, Edgardo, MD, 325 mg at 07/04/19 0828 .  hydrocortisone sodium succinate (SOLU-CORTEF) 100 MG injection 50 mg, 50 mg, Intravenous, Q6H, Kasa, Kurian, MD, 50 mg at 07/04/19 0829 .  insulin aspart (novoLOG) injection 0-20 Units, 0-20 Units, Subcutaneous, Q4H, Awilda Bill, NP, 4 Units at 07/04/19 [phone] .  insulin glargine (LANTUS) injection 10 Units, 10 Units, Subcutaneous, QHS, Blakeney, Dreama Saa, NP .  lactated ringers infusion, , Intravenous, Continuous, Cintron-Diaz, Edgardo, MD, Last Rate: 50 mL/hr at 07/04/19 0700, Rate Verify at 07/04/19 0700 .  metroNIDAZOLE (FLAGYL) IVPB 500 mg, 500 mg, Intravenous, Q8H, Herbert Pun, MD, Stopped at 07/04/19 0435 .  morphine 2 MG/ML injection 1-2 mg, 1-2 mg, Intravenous, Q4H PRN, Herbert Pun, MD .  mupirocin ointment (BACTROBAN) 2 %, , Nasal, BID, Herbert Pun, MD, Given at 07/04/19 [phone] .  norepinephrine (LEVOPHED) 4mg  in 254mL premix infusion, 2-15 mcg/min, Intravenous, Continuous, Cintron-Diaz, Edgardo, MD, Last Rate: 33.8 mL/hr at 07/04/19 1021, 9 mcg/min at 07/04/19 1021 .  pantoprazole (PROTONIX) injection 40 mg,  40 mg, Intravenous, QHS, Herbert Pun, MD, 40 mg at 07/03/19 2230 .  vancomycin variable dose per unstable renal function (pharmacist dosing), ,  Does not apply, See admin instructions, Herbert Pun, MD    ALLERGIES   Bee venom, Ezetimibe, Fenofibrate micronized, Pravastatin, and Sulfa antibiotics    REVIEW OF SYSTEMS     10 point ROS conducted is negative except for confusion  PHYSICAL EXAMINATION   Vital Signs: Temp:  [97.5 F (36.4 C)-98 F (36.7 C)] 97.5 F (36.4 C) (02/26 0715) Pulse Rate:  [27-133] 44 (02/26 0945) Resp:  [12-22] 17 (02/26 1015) BP: (80-130)/(33-108) 103/60 (02/26 1015) SpO2:  [82 %-100 %] 90 % (02/26 0945) FiO2 (%):  [21 %] 21 % (02/25 1328)  GENERAL: Chronically ill-appearing HEAD: Normocephalic, atraumatic.  EYES: Pupils equal, round, reactive to light.  No scleral icterus.  MOUTH: Moist mucosal membrane. NECK: Supple. No thyromegaly. No nodules. No JVD.  PULMONARY: Decreased breath sounds bilaterally CARDIOVASCULAR: S1 and S2. Regular rate and rhythm. No murmurs, rubs, or gallops.  GASTROINTESTINAL: Soft, nontender, non-distended. No masses. Positive bowel sounds. No hepatosplenomegaly.  MUSCULOSKELETAL: No swelling, clubbing, or edema.  NEUROLOGIC: Mild altered sensorium with confusion SKIN:intact,warm,dry   PERTINENT DATA     Infusions: . ceFEPime (MAXIPIME) IV Stopped (07/03/19 1913)  . lactated ringers 50 mL/hr at 07/04/19 0700  . metronidazole Stopped (07/04/19 0435)  . norepinephrine (LEVOPHED) Adult infusion 9 mcg/min (07/04/19 1021)   Scheduled Medications: . aspirin  81 mg Oral Daily  . Chlorhexidine Gluconate Cloth  6 each Topical Daily  . ferrous sulfate  325 mg Oral BID WC  . hydrocortisone sod succinate (SOLU-CORTEF) inj  50 mg Intravenous Q6H  . insulin aspart  0-20 Units Subcutaneous Q4H  . insulin glargine  10 Units Subcutaneous QHS  . mupirocin ointment   Nasal BID  . pantoprazole (PROTONIX) IV  40 mg Intravenous QHS  . vancomycin variable dose per unstable renal function (pharmacist dosing)   Does not apply See admin instructions   PRN  Medications: morphine injection Hemodynamic parameters:   Intake/Output: 02/25 0701 - 02/26 0700 In: 5807.6 [I.V.:2826.4; IV Piggyback:2981.2] Out: 1025 [Urine:925; Drains:50; Blood:50]  Ventilator  Settings: FiO2 (%):  [21 %] 21 %    LAB RESULTS:  Basic Metabolic Panel: Recent Labs  Lab 07/02/19 1628 07/02/19 1628 07/03/19 0510 07/04/19 0531  NA 143  --  145 146*  K 4.6   < > 4.7 4.9  CL 117*  --  119* 122*  CO2 15*  --  16* 18*  GLUCOSE 102*  --  174* 213*  BUN 74*  --  68* 67*  CREATININE 3.13*  --  2.67* 2.66*  CALCIUM 8.5*  --  7.9* 7.8*  MG  --   --  2.1  --   PHOS  --   --  4.7*  --    < > = values in this interval not displayed.   Liver Function Tests: Recent Labs  Lab 07/02/19 1628  AST 41  ALT 37  ALKPHOS 190*  BILITOT 0.7  PROT 6.6  ALBUMIN 2.2*   No results for input(s): LIPASE, AMYLASE in the last 168 hours. No results for input(s): AMMONIA in the last 168 hours. CBC: Recent Labs  Lab 07/02/19 1628 07/03/19 0510 07/04/19 0531  WBC 13.8* 15.1* 11.1*  NEUTROABS 10.8*  --   --   HGB 10.9* 9.3* 8.7*  HCT 34.6* 28.7* 27.8*  MCV 105.8* 104.4* 106.5*  PLT 333 291 290  Cardiac Enzymes: No results for input(s): CKTOTAL, CKMB, CKMBINDEX, TROPONINI in the last 168 hours. BNP: Invalid input(s): POCBNP CBG: Recent Labs  Lab 07/03/19 2212 07/04/19 0001 07/04/19 0141 07/04/19 0352 07/04/19 0725  GLUCAP 338* 333* 313* 233* 175*     IMAGING RESULTS:  Imaging: DG Sacrum/Coccyx  Result Date: 07/02/2019 CLINICAL DATA:  Deep sacral wound. Concern for osteomyelitis. EXAM: SACRUM AND COCCYX - 2+ VIEW COMPARISON:  None. FINDINGS: On the lateral view, there is apparent soft tissue ulceration dorsal to the distal sacrum and coccyx. No foreign body or definite bone destruction is seen in this area. Osseous evaluation is limited by demineralization and overlying stool on the AP views. The hip and sacroiliac joint spaces are preserved. There are  prominent endplate osteophytes in the lower lumbar spine. IMPRESSION: Soft tissue ulceration dorsal to the distal sacrum and coccyx without radiographic evidence of osteomyelitis. Osseous evaluation is limited by demineralization and overlying stool. Electronically Signed   By: Richardean Sale M.D.   On: 07/02/2019 17:40      ASSESSMENT AND PLAN    -Multidisciplinary rounds held today  Septic shock -Likely due to stage IV sacral decubitus -Surgery on case status post debridement -Active wound drainage with approximately 50 cc bloody aspirate -Continue IV antibiotics-currently on Maxipime, Flagyl, vancomycin-appreciate pharmacy -Palliative care consultation-patient is DNR and continuing conversation with family regarding goals of care -Remains on vasopressors and steroids   Renal Failure-acute on chronic stage II -follow chem 7 -follow UO -continue Foley Catheter-assess need daily -Albumin 1 amp daily   ID -continue IV abx as prescibed -follow up cultures  GI/Nutrition GI PROPHYLAXIS as indicated DIET-->TF's as tolerated Constipation protocol as indicated  ENDO - ICU hypoglycemic\Hyperglycemia protocol -check FSBS per protocol   ELECTROLYTES -follow labs as needed -replace as needed -pharmacy consultation   DVT/GI PRX ordered -SCDs  TRANSFUSIONS AS NEEDED MONITOR FSBS ASSESS the need for LABS as needed   Critical care provider statement:    Critical care time (minutes):  33   Critical care time was exclusive of:  Separately billable procedures and treating other patients   Critical care was necessary to treat or prevent imminent or life-threatening deterioration of the following conditions:   Septic shock, sacral decubitus ulcer, multiple comorbid conditions   Critical care was time spent personally by me on the following activities:  Development of treatment plan with patient or surrogate, discussions with consultants, evaluation of patient's response to  treatment, examination of patient, obtaining history from patient or surrogate, ordering and performing treatments and interventions, ordering and review of laboratory studies and re-evaluation of patient's condition.  I assumed direction of critical care for this patient from another provider in my specialty: no    This document was prepared using Dragon voice recognition software and may include unintentional dictation errors.    Ottie Glazier, M.D.  Division of [place]

## 2019-07-04 NOTE — Progress Notes (Signed)
Peripherally Inserted Central Catheter/Midline Placement  The IV Nurse has discussed with the patient and/or persons authorized to consent for the patient, the purpose of this procedure and the potential benefits and risks involved with this procedure.  The benefits include less needle sticks, lab draws from the catheter, and the patient may be discharged home with the catheter. Risks include, but not limited to, infection, bleeding, blood clot (thrombus formation), and puncture of an artery; nerve damage and irregular heartbeat and possibility to perform a PICC exchange if needed/ordered by physician.  Alternatives to this procedure were also discussed.  Bard Power PICC patient education guide, fact sheet on infection prevention and patient information card has been provided to patient /or left at bedside.    PICC/Midline Placement Documentation  PICC Double Lumen [id number] PICC Right Basilic 41 cm 0 cm (Active)  Indication for Insertion or Continuance of Line Vasoactive infusions 07/04/19 2000  Exposed Catheter (cm) 0 cm 07/04/19 2000  Site Assessment Clean;Dry;Intact 07/04/19 2000  Lumen #1 Status Blood return noted;Flushed;Saline locked 07/04/19 2000  Lumen #2 Status Blood return noted;Flushed;Saline locked 07/04/19 2000  Dressing Type Transparent;Occlusive;Securing device 07/04/19 2000  Dressing Status Clean;Dry;Intact;Antimicrobial disc in place 07/04/19 2000  Line Adjustment (NICU/IV Team Only) No 07/04/19 2000  Dressing Intervention New dressing 07/04/19 2000  Dressing Change Due 07/11/19 07/04/19 2000       Edward Strong 07/04/2019, 8:17 PM

## 2019-07-04 NOTE — Progress Notes (Signed)
Daily Progress Note   Patient Name: Edward Strong       Date: 07/04/2019 DOB: Dec 23, 1929  Age: 86 y.o. MRN#: [id number] Attending Physician: Ottie Glazier, MD Primary Care Physician: Toni Arthurs, NP Admit Date: 07/02/2019  Reason for Consultation/Follow-up: Establishing goals of care  Subjective: Patient pleasantly confused, eating. Denies pain. Does not know he is in hospital.  Length of Stay: 2  Current Medications: Scheduled Meds:  . albuterol  2.5 mg Nebulization Q6H  . apixaban  2.5 mg Oral BID  . aspirin  81 mg Oral Daily  . Chlorhexidine Gluconate Cloth  6 each Topical Daily  . ferrous sulfate  325 mg Oral BID WC  . hydrocortisone sod succinate (SOLU-CORTEF) inj  50 mg Intravenous Q6H  . insulin aspart  0-20 Units Subcutaneous Q4H  . insulin glargine  10 Units Subcutaneous QHS  . midodrine  5 mg Oral TID WC  . mupirocin ointment   Nasal BID  . pantoprazole (PROTONIX) IV  40 mg Intravenous QHS    Continuous Infusions: . sodium chloride 250 mL (07/04/19 1233)  . albumin human 12.5 g (07/04/19 1228)  . ceFEPime (MAXIPIME) IV Stopped (07/03/19 1913)  . lactated ringers 150 mL/hr at 07/04/19 1201  . metronidazole 500 mg (07/04/19 1234)  . norepinephrine (LEVOPHED) Adult infusion 8 mcg/min (07/04/19 1205)  . vancomycin      PRN Meds: HYDROcodone-acetaminophen, morphine injection  Physical Exam Constitutional:      General: He is not in acute distress. HENT:     Head: Normocephalic and atraumatic.  Cardiovascular:     Rate and Rhythm: Normal rate. Rhythm irregular.  Pulmonary:     Effort: Pulmonary effort is normal. No respiratory distress.  Musculoskeletal:     Comments: LLE in splint  Skin:    General: Skin is warm and dry.  Neurological:     Mental Status: He is  alert. He is disoriented.             Vital Signs: BP (!) 110/50   Pulse (!) 44   Temp 98.2 F (36.8 C) (Oral)   Resp 17   Ht 5\' 11"  (1.803 m)   Wt 89.1 kg   SpO2 90%   BMI 27.40 kg/m  SpO2: SpO2: 90 % O2 Device: O2 Device: Room Air O2 Flow Rate: O2 Flow Rate (L/min): 6 L/min  Intake/output summary:   Intake/Output Summary (Last 24 hours) at 07/04/2019 1622 Last data filed at 07/04/2019 1315 Gross per 24 hour  Intake 4334.35 ml  Output 950 ml  Net 3384.35 ml   LBM: Last BM Date: 07/03/19 Baseline Weight: Weight: 104.3 kg Most recent weight: Weight: 89.1 kg       Palliative Assessment/Data: PPS 40%    Flowsheet Rows     Most Recent Value  Intake Tab  Referral Department  Critical care  Unit at Time of Referral  ICU  Palliative Care Primary Diagnosis  Sepsis/Infectious Disease  Date Notified  07/03/19  Palliative Care Type  New Palliative care  Reason for referral  Clarify Goals of Care  Date of Admission  07/02/19  Date first seen by Palliative Care  07/03/19  # of days Palliative referral response time  0 Day(s)  # of days IP prior to Palliative referral  1  Clinical Assessment  Palliative Performance Scale Score  40%  Psychosocial & Spiritual Assessment  Palliative Care Outcomes  Patient/Family meeting held?  Yes  Who was at the meeting?  son and DIL  Palliative Care Outcomes  Clarified goals of care, Provided psychosocial or spiritual support      Patient Active Problem List   Diagnosis Date Noted  . Sepsis ([place]) 07/03/2019  . Sacral decubitus ulcer, stage IV ([place])   . Goals of care, counseling/discussion   . Palliative care by specialist   . Sacral ulcer ([place]) 07/02/2019  . Acute urinary retention 06/12/2019  . BPH with obstruction/lower urinary tract symptoms 06/12/2019  . UTI (urinary tract infection) 06/12/2019  . Hyperglycemia due to type 2 diabetes mellitus ([place]) 06/11/2019  . Metabolic acidosis 123456  . S/P ORIF (open reduction  internal fixation) fracture 06/11/2019  . Hypotension 06/11/2019  . Acute metabolic encephalopathy 123456  . AKI (acute kidney injury) ([place]) 06/11/2019  . Postoperative anemia due to acute blood loss 05/16/2019  . Acute kidney injury superimposed on chronic kidney disease ([place])   . Closed fracture of left distal femur ([place]) 05/12/2019  . Chronic systolic CHF (congestive heart failure) ([place]) 05/12/2019  . Leukocytosis 05/12/2019  . Type II diabetes mellitus with renal manifestations ([place])   . HTN (hypertension)   . CKD (chronic kidney disease), stage IIIa   . Atrial fibrillation [facility])     Palliative Care Assessment & Plan   HPI: 86 y.o. male  with past medical history of T2DM, CKD 3, a fib on eliquis, CAD, systolic heart failure, Hodgkins lymphoma, and urinary retention admitted on 07/02/2019 with sacral ulcer and concern for osteomyelitis. He was evaluated by wound care on day of admission and found to have grossly necrotic stage 4 wound over his sacrum. He was hospitalized 2/3-2/8 for AKI. Also had femur fracture Jan 2021 that required ORIF. PMT consulted for [place].   Assessment: Follow up with patient's son [name]. He is pleased that patient tolerated procedure well yesterday. Understands he is still requiring vasopressors - they are slowly being weaned down. For now would like to continue current interventions and see how patient responds. Maintain DNR.  Recommendations/Plan:  Family with good understanding of situation and reasonable about plan of care  Maintain DNR  Will f/u next week if still hospitalized  Code Status:  DNR  Prognosis:   Unable to determine - poor prognosis r/t septic shock, osteo, albumin 2.2, poor baseline functional status  Discharge Planning:  To Be Determined  Care plan was discussed with RN  Thank you for allowing the Palliative Medicine Team to assist in the care of this patient.   Total Time 15 minutes Prolonged Time Billed  no        Greater than 50%  of this time was spent counseling and coordinating care related to the above assessment and plan.  Juel Burrow, DNP, [facility] Palliative Medicine Team Team Phone # [phone]  Pager [phone]

## 2019-07-04 NOTE — Progress Notes (Signed)
Oriented to self. Adequate urine output this shift. Titrated levophed per pt blood pressure. PICC line pending. Wound vac intact.

## 2019-07-04 NOTE — Progress Notes (Signed)
Pharmacy Electrolyte Monitoring Consult:  Pharmacy consulted to assist in monitoring and replacing electrolytes in this 86 y.o. male admitted on 07/02/2019 with Wound Infection. Patient currently admitted to ICU and requiring vasopressor support. PICC placement pending.    Labs:  Sodium (mmol/L)  Date Value  07/04/2019 146 (H)   Potassium (mmol/L)  Date Value  07/04/2019 4.9   Magnesium (mg/dL)  Date Value  07/03/2019 2.1   Phosphorus (mg/dL)  Date Value  07/03/2019 4.7 (H)   Calcium (mg/dL)  Date Value  07/04/2019 7.8 (L)   Albumin (g/dL)  Date Value  07/02/2019 2.2 (L)    Assessment/Plan: 1. Electrolytes: LR at 136mL/hr. Sodium trending up. BMP with am labs.   2. Glucose: Patient started on stress dose steroids on 2/25. Patient receiving SSI Q4hr and Lantus 10 units Qhs. Will evaluate daily and adjust.   3. Constipation: patient with bowel movement on 2.25. Will continue to monitor.   Pharmacy will continue to monitor and adjust per consult.   Artemisia Auvil L 07/04/2019 8:02 PM

## 2019-07-04 NOTE — Progress Notes (Signed)
Pharmacy Antibiotic Note  Edward Strong is a 87 y.o. male admitted on 07/02/2019 with septic shock secondary to stage IV infected sacral ulceration. Patient with past medical history significant for atrial fibrillation, dementia, Hodgkin's Lymphoma, Urinary Retention and UTI leading to recent hospitalization, CKD, hypertension, and diabetes. Patient s/p debridement on 2/25. Pharmacy has been consulted for cefepime and vancomycin dosing. Patient also receiving metronidazole 500mg  IV Q8hr.   Plan: Serum creatinine remains stable. Will continue patient on vancomycin 1000mg  IV Q36hr. Expected AUC: 479. Expected trough: 13.5. Will continue to obtain serum creatinine daily while on vancomycin. Will obtain peaks/troughs as clinically indicated.   Continue cefepime 2g IV Q24hr. Serum creatinine with am labs.   Height: 5\' 11"  (180.3 cm) Weight: 196 lb 6.9 oz (89.1 kg) IBW/kg (Calculated) : 75.3  Temp (24hrs), Avg:97.8 F (36.6 C), Min:97.5 F (36.4 C), Max:98 F (36.7 C)  Recent Labs  Lab 07/02/19 1628 07/02/19 1856 07/03/19 0510 07/04/19 0531  WBC 13.8*  --  15.1* 11.1*  CREATININE 3.13*  --  2.67* 2.66*  LATICACIDVEN 3.2* 2.1*  --   --   VANCORANDOM  --   --  16  --     Estimated Creatinine Clearance: 20.8 mL/min (A) (by C-G formula based on SCr of 2.66 mg/dL (H)).    Allergies  Allergen Reactions  . Bee Venom Other (See Comments)  . Ezetimibe Other (See Comments)  . Fenofibrate Micronized Other (See Comments)  . Pravastatin Other (See Comments)  . Sulfa Antibiotics Other (See Comments)    Antimicrobials this admission: Cefepime 2/24 >>  Metronidazole 2/24 >> Vancomycin 2/24 >>   Dose adjustments this admission: 2/24 1849 Vancomycin 2g IV x 1 2/25 0703 Vancomycin 1.25g IV x 1 2/26 Vancomycin 1g IV Q36hr.   Microbiology results: 2/25 Tissue Culture: abundant GPC, abundant GNR, abundant GPR 2/24 BCx: no growth x 2 days 2/24 UCx: no growth   2/25 MRSA PCR: positive  2/24  SARS Coronavirus: negative  2/24 Influenza A/B: negative   Thank you for allowing pharmacy to be a part of this patient's care.  Akira Perusse L 07/04/2019 11:36 AM

## 2019-07-05 LAB — CBC
HCT: 24.3 % — ABNORMAL LOW (ref 39.0–52.0)
Hemoglobin: 7.9 g/dL — ABNORMAL LOW (ref 13.0–17.0)
MCH: 34.1 pg — ABNORMAL HIGH (ref 26.0–34.0)
MCHC: 32.5 g/dL (ref 30.0–36.0)
MCV: 104.7 fL — ABNORMAL HIGH (ref 80.0–100.0)
Platelets: 229 10*3/uL (ref 150–400)
RBC: 2.32 MIL/uL — ABNORMAL LOW (ref 4.22–5.81)
RDW: 15.2 % (ref 11.5–15.5)
WBC: 13.9 10*3/uL — ABNORMAL HIGH (ref 4.0–10.5)
nRBC: 0.4 % — ABNORMAL HIGH (ref 0.0–0.2)

## 2019-07-05 LAB — BASIC METABOLIC PANEL
Anion gap: 5 (ref 5–15)
BUN: 61 mg/dL — ABNORMAL HIGH (ref 8–23)
CO2: 18 mmol/L — ABNORMAL LOW (ref 22–32)
Calcium: 7.7 mg/dL — ABNORMAL LOW (ref 8.9–10.3)
Chloride: 120 mmol/L — ABNORMAL HIGH (ref 98–111)
Creatinine, Ser: 2.38 mg/dL — ABNORMAL HIGH (ref 0.61–1.24)
GFR calc Af Amer: 27 mL/min — ABNORMAL LOW (ref >60)
GFR calc non Af Amer: 24 mL/min — ABNORMAL LOW (ref >60)
Glucose, Bld: 237 mg/dL — ABNORMAL HIGH (ref 70–99)
Potassium: 4.6 mmol/L (ref 3.5–5.1)
Sodium: 143 mmol/L (ref 135–145)

## 2019-07-05 LAB — GLUCOSE, CAPILLARY
Glucose-Capillary: 168 mg/dL — ABNORMAL HIGH (ref 70–99)
Glucose-Capillary: 182 mg/dL — ABNORMAL HIGH (ref 70–99)
Glucose-Capillary: 210 mg/dL — ABNORMAL HIGH (ref 70–99)
Glucose-Capillary: 222 mg/dL — ABNORMAL HIGH (ref 70–99)
Glucose-Capillary: 223 mg/dL — ABNORMAL HIGH (ref 70–99)
Glucose-Capillary: 244 mg/dL — ABNORMAL HIGH (ref 70–99)

## 2019-07-05 LAB — PROCALCITONIN: Procalcitonin: 0.21 ng/mL

## 2019-07-05 MED ORDER — ATROPINE SULFATE 1 MG/10ML IJ SOSY
1.0000 mg | PREFILLED_SYRINGE | Freq: Once | INTRAMUSCULAR | Status: AC
  Administered 2019-07-07: 1 mg via INTRAVENOUS
  Filled 2019-07-05: qty 10

## 2019-07-05 MED ORDER — ALBUTEROL SULFATE (2.5 MG/3ML) 0.083% IN NEBU
2.5000 mg | INHALATION_SOLUTION | Freq: Three times a day (TID) | RESPIRATORY_TRACT | Status: DC
  Administered 2019-07-05 – 2019-07-07 (×8): 2.5 mg via RESPIRATORY_TRACT
  Filled 2019-07-05 (×8): qty 3

## 2019-07-05 MED ORDER — DOPAMINE-DEXTROSE 3.2-5 MG/ML-% IV SOLN
0.0000 ug/kg/min | INTRAVENOUS | Status: DC
  Administered 2019-07-05: 5 ug/kg/min via INTRAVENOUS
  Filled 2019-07-05 (×2): qty 250

## 2019-07-05 NOTE — Progress Notes (Signed)
Pharmacy Antibiotic Note  Edward Strong is a 86 y.o. male admitted on 07/02/2019 with septic shock secondary to stage IV infected sacral ulceration. Patient with past medical history significant for atrial fibrillation, dementia, Hodgkin's Lymphoma, Urinary Retention and UTI leading to recent hospitalization, CKD, hypertension, and diabetes. Patient s/p debridement on 2/25. Pharmacy has been consulted for cefepime and vancomycin dosing. Patient also receiving metronidazole 500mg  IV Q8hr.   Plan: Serum creatinine continues to improve. Based on current renal function, dose appropriate. If renal function continues to improve, will consider random level prior to dose Monday evening. Will continue patient on vancomycin 1000mg  IV Q36hr. Expected AUC: 437. Expected trough: 11.8. Will continue to obtain serum creatinine daily while on vancomycin.   Continue cefepime 2g IV Q24hr. Serum creatinine with am labs.   Height: 5\' 11"  (180.3 cm) Weight: 196 lb 6.9 oz (89.1 kg) IBW/kg (Calculated) : 75.3  Temp (24hrs), Avg:98.7 F (37.1 C), Min:98.2 F (36.8 C), Max:99 F (37.2 C)  Recent Labs  Lab 07/02/19 1628 07/02/19 1856 07/03/19 0510 07/04/19 0531 07/05/19 0418  WBC 13.8*  --  15.1* 11.1* 13.9*  CREATININE 3.13*  --  2.67* 2.66* 2.38*  LATICACIDVEN 3.2* 2.1*  --   --   --   VANCORANDOM  --   --  16  --   --     Estimated Creatinine Clearance: 23.3 mL/min (A) (by C-G formula based on SCr of 2.38 mg/dL (H)).    Allergies  Allergen Reactions  . Bee Venom Other (See Comments)  . Ezetimibe Other (See Comments)  . Fenofibrate Micronized Other (See Comments)  . Pravastatin Other (See Comments)  . Sulfa Antibiotics Other (See Comments)    Antimicrobials this admission: Cefepime 2/24 >>  Metronidazole 2/24 >> Vancomycin 2/24 >>   Dose adjustments this admission: 2/24 1849 Vancomycin 2g IV x 1 2/25 0703 Vancomycin 1.25g IV x 1 2/26 Vancomycin 1g IV Q36hr.   Microbiology results: 2/25  Tissue Culture: abundant GPC, abundant GNR, abundant GPR, diphtheroids (corynebacterium) 2/24 BCx: no growth 2/24 UCx: no growth   2/25 MRSA PCR: positive  2/24 SARS Coronavirus: negative  2/24 Influenza A/B: negative   Thank you for allowing pharmacy to be a part of this patient's care.  Tawnya Crook, PharmD 07/05/2019 7:48 AM

## 2019-07-05 NOTE — Progress Notes (Signed)
CRITICAL CARE PROGRESS NOTE    Name: Edward Strong MRN: [id number] DOB: February 04, 1930     LOS: 3   SUBJECTIVE FINDINGS & SIGNIFICANT EVENTS   Patient description:  86 yo male admitted with acute on chronic renal failure with metabolic acidosis/lactic acidosis and septic shock requiring levophed gtt secondary to stage IV sacral ulcer   Lines / Drains: PIV x2  Cultures / Sepsis markers: Blood cultures, COVID-19, nasal MRSA PCR positive Wound culture with multispecies results in process  Antibiotics: Cefepime, Flagyl, vancomycin per pharmacist consultation   Protocols / Consultants: Surgery, PCCM, hospitalist, pharmacy   PAST MEDICAL HISTORY   Past Medical History:  Diagnosis Date  . Atrial fibrillation ([place])   . CKD (chronic kidney disease), stage IIIa   . HTN (hypertension)   . Type II diabetes mellitus with renal manifestations [facility])      SURGICAL HISTORY   Past Surgical History:  Procedure Laterality Date  . APPENDECTOMY    . APPLICATION OF WOUND VAC N/A 07/03/2019   Procedure: APPLICATION OF WOUND VAC;  Surgeon: Herbert Pun, MD;  Location: [facility] ORS;  Service: General;  Laterality: [id number]  . INCISION AND DRAINAGE OF WOUND N/A 07/03/2019   Procedure: IRRIGATION AND DEBRIDEMENT SACRAL ULCER;  Surgeon: Herbert Pun, MD;  Location: [facility] ORS;  Service: General;  Laterality: N/A;  . ORIF FEMUR FRACTURE Left 05/13/2019   Procedure: OPEN REDUCTION INTERNAL FIXATION (ORIF) of closed  left periprosthetic supracondylar femur fracture.;  Surgeon: Corky Mull, MD;  Location: [facility] ORS;  Service: Orthopedics;  Laterality: Left;  . REPLACEMENT TOTAL KNEE Left      FAMILY HISTORY   History reviewed. No pertinent family history.   SOCIAL HISTORY   Social History   Tobacco  Use  . Smoking status: Former Research scientist (life sciences)  . Smokeless tobacco: Never Used  Substance Use Topics  . Alcohol use: Never  . Drug use: Not Currently     MEDICATIONS   Current Medication:  Current Facility-Administered Medications:  .  0.9 %  sodium chloride infusion, 250 mL, Intravenous, Continuous, Ottie Glazier, MD, Stopped at 07/04/19 1458 .  albumin human 25 % solution 12.5 g, 12.5 g, Intravenous, Daily, Lanney Gins, Josemanuel Eakins, MD, Last Rate: 60 mL/hr at 07/05/19 0943, 12.5 g at 07/05/19 0943 .  albuterol (PROVENTIL) (2.5 MG/3ML) 0.083% nebulizer solution 2.5 mg, 2.5 mg, Nebulization, TID, Lanney Gins, Kysen Wetherington, MD, 2.5 mg at 07/05/19 0801 .  apixaban (ELIQUIS) tablet 2.5 mg, 2.5 mg, Oral, BID, Charlett Nose, RPH, 2.5 mg at 07/05/19 [id number] .  aspirin chewable tablet 81 mg, 81 mg, Oral, Daily, Herbert Pun, MD, 81 mg at 07/05/19 0943 .  atropine 1 MG/10ML injection 1 mg, 1 mg, Intravenous, Once, Darel Hong D, NP .  ceFEPIme (MAXIPIME) 2 g in sodium chloride 0.9 % 100 mL IVPB, 2 g, Intravenous, Q24H, Herbert Pun, MD, Stopped at 07/04/19 2224 .  Chlorhexidine Gluconate Cloth 2 % PADS 6 each, 6 each, Topical, Daily, Herbert Pun, MD, 6 each at 07/05/19 0000 .  DOPamine (INTROPIN) 800 mg in dextrose 5 % 250 mL (3.2 mg/mL) infusion, 0-20 mcg/kg/min, Intravenous, Titrated, Darel Hong D, NP, Last Rate: 4.18 mL/hr at 07/05/19 0600, 2.5 mcg/kg/min at 07/05/19 0600 .  ferrous sulfate tablet 325 mg, 325 mg, Oral, BID WC, Herbert Pun, MD, 325 mg at 07/05/19 0815 .  HYDROcodone-acetaminophen (NORCO/VICODIN) 5-325 MG per tablet 1 tablet, 1 tablet, Oral, Q6H PRN, Ottie Glazier, MD, 1 tablet at 07/05/19 0943 .  hydrocortisone sodium succinate (  SOLU-CORTEF) 100 MG injection 50 mg, 50 mg, Intravenous, Q6H, Kasa, Kurian, MD, 50 mg at 07/05/19 0639 .  insulin aspart (novoLOG) injection 0-20 Units, 0-20 Units, Subcutaneous, Q4H, Awilda Bill, NP, 4 Units at 07/05/19  0815 .  insulin glargine (LANTUS) injection 10 Units, 10 Units, Subcutaneous, QHS, Awilda Bill, NP, 10 Units at 07/04/19 2134 .  lactated ringers infusion, , Intravenous, Continuous, Riordan Walle, MD, Last Rate: 150 mL/hr at 07/05/19 0600, Rate Verify at 07/05/19 0600 .  metroNIDAZOLE (FLAGYL) IVPB 500 mg, 500 mg, Intravenous, Q8H, Herbert Pun, MD, Stopped at 07/05/19 0451 .  midodrine (PROAMATINE) tablet 5 mg, 5 mg, Oral, TID WC, Hearl Heikes, MD, 5 mg at 07/05/19 0815 .  morphine 2 MG/ML injection 1-2 mg, 1-2 mg, Intravenous, Q4H PRN, Herbert Pun, MD, 2 mg at 07/05/19 0125 .  mupirocin ointment (BACTROBAN) 2 %, , Nasal, BID, Herbert Pun, MD, 1 application at 0000000 0944 .  norepinephrine (LEVOPHED) 16 mg in 234mL premix infusion, 0-40 mcg/min, Intravenous, Titrated, Bradly Bienenstock, NP, Stopped at 07/05/19 0350 .  pantoprazole (PROTONIX) injection 40 mg, 40 mg, Intravenous, QHS, Herbert Pun, MD, 40 mg at 07/04/19 2131 .  sodium chloride flush (NS) 0.9 % injection 10-40 mL, 10-40 mL, Intracatheter, Q12H, Ottie Glazier, MD, 40 mL at 07/04/19 2159 .  sodium chloride flush (NS) 0.9 % injection 10-40 mL, 10-40 mL, Intracatheter, PRN, Ottie Glazier, MD .  vancomycin (VANCOCIN) IVPB 1000 mg/200 mL premix, 1,000 mg, Intravenous, Q36H, Ottie Glazier, MD, Stopped at 07/04/19 2251    ALLERGIES   Bee venom, Ezetimibe, Fenofibrate micronized, Pravastatin, and Sulfa antibiotics    REVIEW OF SYSTEMS     10 point ROS conducted is negative except for confusion  PHYSICAL EXAMINATION   Vital Signs: Temp:  [97.5 F (36.4 C)-99 F (37.2 C)] 97.5 F (36.4 C) (02/27 0800) Pulse Rate:  [32-98] 58 (02/27 1000) Resp:  [11-24] 14 (02/27 1000) BP: (89-139)/(30-72) 123/35 (02/27 1000) SpO2:  [88 %-100 %] 98 % (02/27 1000)  GENERAL: Chronically ill-appearing HEAD: Normocephalic, atraumatic.  EYES: Pupils equal, round, reactive to light.  No  scleral icterus.  MOUTH: Moist mucosal membrane. NECK: Supple. No thyromegaly. No nodules. No JVD.  PULMONARY: Decreased breath sounds bilaterally CARDIOVASCULAR: S1 and S2. Regular rate and rhythm. No murmurs, rubs, or gallops.  GASTROINTESTINAL: Soft, nontender, non-distended. No masses. Positive bowel sounds. No hepatosplenomegaly.  MUSCULOSKELETAL: No swelling, clubbing, or edema.  NEUROLOGIC: Mild altered sensorium with confusion SKIN:intact,warm,dry   PERTINENT DATA     Infusions: . sodium chloride Stopped (07/04/19 1458)  . albumin human 12.5 g (07/05/19 0943)  . ceFEPime (MAXIPIME) IV Stopped (07/04/19 2224)  . DOPamine 2.5 mcg/kg/min (07/05/19 0600)  . lactated ringers 150 mL/hr at 07/05/19 0600  . metronidazole Stopped (07/05/19 0451)  . norepinephrine (LEVOPHED) Adult infusion Stopped (07/05/19 0350)  . vancomycin Stopped (07/04/19 2251)   Scheduled Medications: . albuterol  2.5 mg Nebulization TID  . apixaban  2.5 mg Oral BID  . aspirin  81 mg Oral Daily  . atropine  1 mg Intravenous Once  . Chlorhexidine Gluconate Cloth  6 each Topical Daily  . ferrous sulfate  325 mg Oral BID WC  . hydrocortisone sod succinate (SOLU-CORTEF) inj  50 mg Intravenous Q6H  . insulin aspart  0-20 Units Subcutaneous Q4H  . insulin glargine  10 Units Subcutaneous QHS  . midodrine  5 mg Oral TID WC  . mupirocin ointment   Nasal BID  . pantoprazole (PROTONIX) IV  40 mg Intravenous QHS  . sodium chloride flush  10-40 mL Intracatheter Q12H   PRN Medications: HYDROcodone-acetaminophen, morphine injection, sodium chloride flush Hemodynamic parameters:   Intake/Output: 02/26 0701 - 02/27 0700 In: 4426.5 [P.O.:120; I.V.:3472.8; IV Piggyback:633.7] Out: 1350 [Urine:1150; Drains:200]  Ventilator  Settings:      LAB RESULTS:  Basic Metabolic Panel: Recent Labs  Lab 07/02/19 1628 07/02/19 1628 07/03/19 0510 07/03/19 0510 07/04/19 0531 07/05/19 0418  NA 143  --  145  --  146*  143  K 4.6   < > 4.7   < > 4.9 4.6  CL 117*  --  119*  --  122* 120*  CO2 15*  --  16*  --  18* 18*  GLUCOSE 102*  --  174*  --  213* 237*  BUN 74*  --  68*  --  67* 61*  CREATININE 3.13*  --  2.67*  --  2.66* 2.38*  CALCIUM 8.5*  --  7.9*  --  7.8* 7.7*  MG  --   --  2.1  --   --   --   PHOS  --   --  4.7*  --   --   --    < > = values in this interval not displayed.   Liver Function Tests: Recent Labs  Lab 07/02/19 1628  AST 41  ALT 37  ALKPHOS 190*  BILITOT 0.7  PROT 6.6  ALBUMIN 2.2*   No results for input(s): LIPASE, AMYLASE in the last 168 hours. No results for input(s): AMMONIA in the last 168 hours. CBC: Recent Labs  Lab 07/02/19 1628 07/03/19 0510 07/04/19 0531 07/05/19 0418  WBC 13.8* 15.1* 11.1* 13.9*  NEUTROABS 10.8*  --   --   --   HGB 10.9* 9.3* 8.7* 7.9*  HCT 34.6* 28.7* 27.8* 24.3*  MCV 105.8* 104.4* 106.5* 104.7*  PLT 333 291 290 229   Cardiac Enzymes: No results for input(s): CKTOTAL, CKMB, CKMBINDEX, TROPONINI in the last 168 hours. BNP: Invalid input(s): POCBNP CBG: Recent Labs  Lab 07/04/19 1542 07/04/19 2015 07/05/19 0001 07/05/19 0338 07/05/19 0735  GLUCAP 234* 248* 222* 223* 182*     IMAGING RESULTS:  Imaging: DG Chest Port 1 View  Result Date: 07/04/2019 CLINICAL DATA:  Confirm PICC placement EXAM: PORTABLE CHEST 1 VIEW COMPARISON:  07/04/2019, 06/11/2019 FINDINGS: Bilateral interstitial and patchy alveolar airspace opacities. Postsurgical changes in the right lower lung. No focal consolidation. No significant pleural effusion. Small calcified right lower lobe pulmonary nodule likely reflecting sequela prior granulomatous disease. Stable cardiomegaly. No acute osseous abnormality. IMPRESSION: 1. Right-sided PICC line with the tip projecting over the SVC. Electronically Signed   By: Kathreen Devoid   On: 07/04/2019 20:37   DG Chest Port 1 View  Result Date: 07/04/2019 CLINICAL DATA:  Weakness and shortness of breath. EXAM: PORTABLE  CHEST 1 VIEW COMPARISON:  06/11/2019 FINDINGS: The heart is enlarged but appears stable. Stable mediastinal and hilar contours. Persistent right pleural effusion or chronic pleural thickening. Stable surgical scarring changes involving the right lung. Stable appearing bibasilar scarring changes. No definite acute overlying pulmonary findings. IMPRESSION: 1. Stable cardiac enlargement. 2. Stable surgical scarring changes involving the right lung and stable right pleural effusion versus pleural thickening and bibasilar scarring. Electronically Signed   By: Marijo Sanes M.D.   On: 07/04/2019 11:23   ECHOCARDIOGRAM COMPLETE  Result Date: 07/04/2019    ECHOCARDIOGRAM REPORT   Patient Name:   Edward Strong Date of  Exam: 07/04/2019 Medical Rec #:  [id number]     Height:       71.0 in Accession #:    [id number]    Weight:       196.4 lb Date of Birth:  23-Feb-1930     BSA:          2.092 m Patient Age:    65 years      BP:           99/65 mmHg Patient Gender: M             HR:           57 bpm. Exam Location:  [facility] Procedure: 2D Echo, Color Doppler and Cardiac Doppler Indications:     CHF- acute diastolic [id number]  History:         Patient has no prior history of Echocardiogram examinations.                  Arrythmias:Atrial Fibrillation; Risk Factors:Hypertension and                  Diabetes. CKD.  Sonographer:     Sherrie Sport RDCS (AE) Referring Phys:  [id number] Ottie Glazier Diagnosing Phys: Ida Rogue MD IMPRESSIONS  1. Left ventricular ejection fraction, by estimation, is 60 to 65%. The left ventricle has normal function. The left ventricle has no regional wall motion abnormalities. Left ventricular diastolic parameters are indeterminate.  2. Right ventricular systolic function is normal. The right ventricular size is normal. There is mild to moderately elevated pulmonary artery systolic pressure.  3. Left atrial size was moderately dilated. FINDINGS  Left Ventricle: Left ventricular ejection fraction, by  estimation, is 60 to 65%. The left ventricle has normal function. The left ventricle has no regional wall motion abnormalities. The left ventricular internal cavity size was normal in size. There is  no left ventricular hypertrophy. Left ventricular diastolic parameters are indeterminate. Right Ventricle: The right ventricular size is normal. No increase in right ventricular wall thickness. Right ventricular systolic function is normal. There is moderately elevated pulmonary artery systolic pressure. The tricuspid regurgitant velocity is 3.05 m/s, and with an assumed right atrial pressure of 5 mmHg, the estimated right ventricular systolic pressure is [id number] mmHg. Left Atrium: Left atrial size was moderately dilated. Right Atrium: Right atrial size was normal in size. Pericardium: There is no evidence of pericardial effusion. Mitral Valve: The mitral valve is normal in structure and function. Normal mobility of the mitral valve leaflets. Mild mitral valve regurgitation. No evidence of mitral valve stenosis. Tricuspid Valve: The tricuspid valve is normal in structure. Tricuspid valve regurgitation is mild . No evidence of tricuspid stenosis. Aortic Valve: The aortic valve is grossly normal. Aortic valve regurgitation is not visualized. No aortic stenosis is present. Aortic valve mean gradient measures 5.0 mmHg. Aortic valve peak gradient measures 9.2 mmHg. Aortic valve area, by VTI measures 2.07 cm. Pulmonic Valve: The pulmonic valve was normal in structure. Pulmonic valve regurgitation is not visualized. No evidence of pulmonic stenosis. Aorta: The aortic root is normal in size and structure. Venous: The inferior vena cava is normal in size with greater than 50% respiratory variability, suggesting right atrial pressure of 3 mmHg. IAS/Shunts: No atrial level shunt detected by color flow Doppler.  LEFT VENTRICLE PLAX 2D LVIDd:         4.99 cm LVIDs:         3.49 cm LV PW:  1.39 cm LV IVS:        0.79 cm LVOT  diam:     2.00 cm LV SV:         64 LV SV Index:   31 LVOT Area:     3.14 cm  RIGHT VENTRICLE RV Basal diam:  4.23 cm RV S prime:     16.10 cm/s TAPSE (M-mode): 3.8 cm LEFT ATRIUM              Index       RIGHT ATRIUM           Index LA diam:        6.00 cm  2.87 cm/m  RA Area:     29.10 cm LA Vol (A2C):   110.0 ml 52.57 ml/m RA Volume:   89.70 ml  42.87 ml/m LA Vol (A4C):   133.0 ml 63.56 ml/m LA Biplane Vol: 132.0 ml 63.08 ml/m  AORTIC VALVE                    PULMONIC VALVE AV Area (Vmax):    2.07 cm     PV Vmax:        0.82 m/s AV Area (Vmean):   2.04 cm     PV Peak grad:   2.7 mmHg AV Area (VTI):     2.07 cm     RVOT Peak grad: 4 mmHg AV Vmax:           151.50 cm/s AV Vmean:          102.000 cm/s AV VTI:            0.311 m AV Peak Grad:      9.2 mmHg AV Mean Grad:      5.0 mmHg LVOT Vmax:         99.80 cm/s LVOT Vmean:        66.300 cm/s LVOT VTI:          0.205 m LVOT/AV VTI ratio: 0.66  AORTA Ao Root diam: 3.30 cm MITRAL VALVE                TRICUSPID VALVE MV Area (PHT): 2.48 cm     TR Peak grad:   37.2 mmHg MV Decel Time: 306 msec     TR Vmax:        305.00 cm/s MV E velocity: 113.00 cm/s MV A velocity: 113.00 cm/s  SHUNTS MV E/A ratio:  1.00         Systemic VTI:  0.20 m                             Systemic Diam: 2.00 cm Ida Rogue MD Electronically signed by Ida Rogue MD Signature Date/Time: 07/04/2019/6:42:08 PM    Final    [place] EKG SITE RITE  Result Date: 07/04/2019 If Site Rite image not attached, placement could not be confirmed due to current cardiac rhythm.     ASSESSMENT AND PLAN    -Multidisciplinary rounds held today  Septic shock -Likely due to stage IV sacral decubitus -Surgery on case status post debridement -Active wound drainage with approximately 50 cc bloody aspirate -Continue IV antibiotics-currently on Maxipime, Flagyl, vancomycin-appreciate pharmacy -Palliative care consultation-patient is DNR and continuing conversation with family regarding goals of  care -Remains on vasopressors and steroids   Renal Failure-acute on chronic stage II -follow chem 7 -follow UO -continue Foley Catheter-assess need daily -Albumin  1 amp daily -creatinine and GFR is improved 07/05/19  ID -continue IV abx as prescibed -follow up cultures  GI/Nutrition GI PROPHYLAXIS as indicated DIET-->TF's as tolerated Constipation protocol as indicated  ENDO - ICU hypoglycemic\Hyperglycemia protocol -check FSBS per protocol   ELECTROLYTES -follow labs as needed -replace as needed -pharmacy consultation   DVT/GI PRX ordered -SCDs  TRANSFUSIONS AS NEEDED MONITOR FSBS ASSESS the need for LABS as needed   Critical care provider statement:    Critical care time (minutes):  33   Critical care time was exclusive of:  Separately billable procedures and treating other patients   Critical care was necessary to treat or prevent imminent or life-threatening deterioration of the following conditions:   Septic shock, sacral decubitus ulcer, multiple comorbid conditions   Critical care was time spent personally by me on the following activities:  Development of treatment plan with patient or surrogate, discussions with consultants, evaluation of patient's response to treatment, examination of patient, obtaining history from patient or surrogate, ordering and performing treatments and interventions, ordering and review of laboratory studies and re-evaluation of patient's condition.  I assumed direction of critical care for this patient from another provider in my specialty: no    This document was prepared using Dragon voice recognition software and may include unintentional dictation errors.    Ottie Glazier, M.D.  Division of [facility]

## 2019-07-05 NOTE — Progress Notes (Signed)
Pharmacy Electrolyte Monitoring Consult:  Pharmacy consulted to assist in monitoring and replacing electrolytes in this 86 y.o. male admitted on 07/02/2019 with Wound Infection. Patient currently admitted to ICU and requiring vasopressor support. PICC placement 2/26.   Labs:  Sodium (mmol/L)  Date Value  07/05/2019 143   Potassium (mmol/L)  Date Value  07/05/2019 4.6   Magnesium (mg/dL)  Date Value  07/03/2019 2.1   Phosphorus (mg/dL)  Date Value  07/03/2019 4.7 (H)   Calcium (mg/dL)  Date Value  07/05/2019 7.7 (L)   Albumin (g/dL)  Date Value  07/02/2019 2.2 (L)    Assessment/Plan: 1. Electrolytes: Electrolytes stable, renal function improving. Follow up with morning labs.  2. Glucose: Patient started on stress dose steroids on 2/25. Patient receiving SSI Q4hr and Lantus 10 units Qhs. Will evaluate daily and adjust.   3. Constipation: patient with bowel movement on 2.25. Will continue to monitor.   Pharmacy will continue to monitor and adjust per consult.   Tawnya Crook, PharmD 07/05/2019 7:39 AM

## 2019-07-05 NOTE — Progress Notes (Signed)
[facility] Day(s): 3.   Post op day(s): 2 Days Post-Op.   Interval History: Patient seen and examined, no acute events or new complaints overnight. Patient reports feeling better. Discussed case with ICU physician. Patient had a bradycardia and Levophed was changed to Dopamine. Currently on low dose. Patient eating well. No other complain. No issues with wound VAC.   Vital signs in last 24 hours: [min-max] current  Temp:  [97.5 F (36.4 C)-99 F (37.2 C)] 97.5 F (36.4 C) (02/27 0800) Pulse Rate:  [32-98] 87 (02/27 0800) Resp:  [11-24] 13 (02/27 0800) BP: (89-139)/(30-72) 107/50 (02/27 0800) SpO2:  [88 %-99 %] 96 % (02/27 0803)     Height: 5\' 11"  (180.3 cm) Weight: 89.1 kg BMI (Calculated): 27.41   Physical Exam:  Constitutional: alert, cooperative and no distress  Respiratory: breathing non-labored at rest  Cardiovascular: regular rate and sinus rhythm  Gastrointestinal: soft, non-tender, and non-distended Back: stage 4 sacral ulcer with minimal ischemic tissue. No purulent secretions.    Pre debridement:    Two days post debridement:    Labs:  CBC Latest Ref Rng & Units 07/05/2019 07/04/2019 07/03/2019  WBC 4.0 - 10.5 K/uL 13.9(H) 11.1(H) 15.1(H)  Hemoglobin 13.0 - 17.0 g/dL 7.9(L) 8.7(L) 9.3(L)  Hematocrit 39.0 - 52.0 % 24.3(L) 27.8(L) 28.7(L)  Platelets 150 - 400 K/uL 229 290 291   CMP Latest Ref Rng & Units 07/05/2019 07/04/2019 07/03/2019  Glucose 70 - 99 mg/dL 237(H) 213(H) 174(H)  BUN 8 - 23 mg/dL 61(H) 67(H) 68(H)  Creatinine 0.61 - 1.24 mg/dL 2.38(H) 2.66(H) 2.67(H)  Sodium 135 - 145 mmol/L 143 146(H) 145  Potassium 3.5 - 5.1 mmol/L 4.6 4.9 4.7  Chloride 98 - 111 mmol/L 120(H) 122(H) 119(H)  CO2 22 - 32 mmol/L 18(L) 18(L) 16(L)  Calcium 8.9 - 10.3 mg/dL 7.7(L) 7.8(L) 7.9(L)  Total Protein 6.5 - 8.1 g/dL - - -  Total Bilirubin 0.3 - 1.2 mg/dL - - -  Alkaline Phos 38 - 126 U/L - - -  AST 15 - 41 U/L - - -  ALT 0 - 44 U/L - - -     Imaging studies: No new pertinent imaging studies   Assessment/Plan:  86 y.o. male with infected stage 4 sacral ulcer 2 Day Post-Op s/p cleansing and debridement and negative pressure dressing, complicated by pertinent comorbidities including a fib on anticoagulation, htn, CKD stage 3a, diabetes mellitus type 2.  WBC still elevated. Creatinine improved. Still on low dose Dopamine. Wound VAC changed today. I personally did the wound VAC change. Improving and no purulent secretions. Agree to continue IV antibiotic therapy and medical management by ICU physician. Next wound VAC change in two days. No need for further debridement at this moment.   Arnold Long, MD

## 2019-07-05 NOTE — Progress Notes (Signed)
Patients wound vac changed this am per doctor. No output noted throughout the day. Patient tuned Q2 throughout the shift. Patient lethargic. Remains on oxygen, apetite poor. Foley in place with adequate urine output. Dopamine titrated to maintain map goal of 65. Son at bedside throughout the afternoon. Updated Per Dr Sanjuana Mae. Continue to monitor.

## 2019-07-06 ENCOUNTER — Encounter: Payer: Self-pay | Admitting: Family Medicine

## 2019-07-06 LAB — CBC
HCT: 22.5 % — ABNORMAL LOW (ref 39.0–52.0)
Hemoglobin: 7 g/dL — ABNORMAL LOW (ref 13.0–17.0)
MCH: 33.2 pg (ref 26.0–34.0)
MCHC: 31.1 g/dL (ref 30.0–36.0)
MCV: 106.6 fL — ABNORMAL HIGH (ref 80.0–100.0)
Platelets: 182 10*3/uL (ref 150–400)
RBC: 2.11 MIL/uL — ABNORMAL LOW (ref 4.22–5.81)
RDW: 14.9 % (ref 11.5–15.5)
WBC: 11 10*3/uL — ABNORMAL HIGH (ref 4.0–10.5)
nRBC: 0.2 % (ref 0.0–0.2)

## 2019-07-06 LAB — GLUCOSE, CAPILLARY
Glucose-Capillary: 155 mg/dL — ABNORMAL HIGH (ref 70–99)
Glucose-Capillary: 157 mg/dL — ABNORMAL HIGH (ref 70–99)
Glucose-Capillary: 164 mg/dL — ABNORMAL HIGH (ref 70–99)
Glucose-Capillary: 178 mg/dL — ABNORMAL HIGH (ref 70–99)
Glucose-Capillary: 192 mg/dL — ABNORMAL HIGH (ref 70–99)
Glucose-Capillary: 194 mg/dL — ABNORMAL HIGH (ref 70–99)

## 2019-07-06 LAB — BASIC METABOLIC PANEL
Anion gap: 9 (ref 5–15)
BUN: 47 mg/dL — ABNORMAL HIGH (ref 8–23)
CO2: 14 mmol/L — ABNORMAL LOW (ref 22–32)
Calcium: 7.7 mg/dL — ABNORMAL LOW (ref 8.9–10.3)
Chloride: 117 mmol/L — ABNORMAL HIGH (ref 98–111)
Creatinine, Ser: 1.83 mg/dL — ABNORMAL HIGH (ref 0.61–1.24)
GFR calc Af Amer: 38 mL/min — ABNORMAL LOW (ref >60)
GFR calc non Af Amer: 32 mL/min — ABNORMAL LOW (ref >60)
Glucose, Bld: 153 mg/dL — ABNORMAL HIGH (ref 70–99)
Potassium: 4.1 mmol/L (ref 3.5–5.1)
Sodium: 140 mmol/L (ref 135–145)

## 2019-07-06 LAB — PROCALCITONIN: Procalcitonin: 0.1 ng/mL

## 2019-07-06 NOTE — Progress Notes (Addendum)
Pharmacy Antibiotic Note  Edward Strong is a 86 y.o. male admitted on 07/02/2019 with septic shock secondary to stage IV infected sacral ulceration. Patient with past medical history significant for atrial fibrillation, dementia, Hodgkin's Lymphoma, Urinary Retention and UTI leading to recent hospitalization, CKD, hypertension, and diabetes. Patient s/p debridement on 2/25. Pharmacy has been consulted for cefepime and vancomycin dosing. Patient also receiving metronidazole 500mg  IV Q8hr.   Plan: Serum creatinine continues to improve. Based on current renal function, dose likely needs to be increased. Will change to vancomycin 1250 mg IV Q36hr with dose to be scheduled at 1200 on 3/1. Will order random level for 3/1 at 1000 (just over 24h from 2/28 am dose) for now and adjust as needed based on morning SCr. With new regimen: Expected AUC: 439. Expected trough: 10.7. Will continue to obtain serum creatinine daily while on vancomycin.   Continue cefepime 2g IV Q24hr for now as CrCl right at cutoff for q24h vs q12h dosing. If renal function continues to improve, will change to q12h tomorrow.  Height: 5\' 11"  (180.3 cm) Weight: 196 lb 6.9 oz (89.1 kg) IBW/kg (Calculated) : 75.3  Temp (24hrs), Avg:98.5 F (36.9 C), Min:98.2 F (36.8 C), Max:98.8 F (37.1 C)  Recent Labs  Lab 07/02/19 1628 07/02/19 1856 07/03/19 0510 07/04/19 0531 07/05/19 0418 07/06/19 0515  WBC 13.8*  --  15.1* 11.1* 13.9* 11.0*  CREATININE 3.13*  --  2.67* 2.66* 2.38* 1.83*  LATICACIDVEN 3.2* 2.1*  --   --   --   --   VANCORANDOM  --   --  16  --   --   --     Estimated Creatinine Clearance: 30.3 mL/min (A) (by C-G formula based on SCr of 1.83 mg/dL (H)).    Allergies  Allergen Reactions  . Bee Venom Other (See Comments)  . Ezetimibe Other (See Comments)  . Fenofibrate Micronized Other (See Comments)  . Pravastatin Other (See Comments)  . Sulfa Antibiotics Other (See Comments)    Antimicrobials this  admission: Cefepime 2/24 >>  Metronidazole 2/24 >> Vancomycin 2/24 >>   Dose adjustments this admission: 2/24 1849 Vancomycin 2g IV x 1 2/25 0703 Vancomycin 1.25g IV x 1 2/26 Vancomycin 1g IV Q36hr 2/28 Vancomycin 1250 mg IV q36h (starting 3/1)  Microbiology results: 2/25 Tissue Culture: abundant GPC, abundant GNR, abundant GPR, diphtheroids (corynebacterium) 2/24 BCx: no growth 2/24 UCx: no growth   2/25 MRSA PCR: positive  2/24 SARS Coronavirus: negative  2/24 Influenza A/B: negative   Thank you for allowing pharmacy to be a part of this patient's care.  Tawnya Crook, PharmD 07/06/2019 8:36 AM

## 2019-07-06 NOTE — Progress Notes (Signed)
CRITICAL CARE PROGRESS NOTE    Name: Edward Strong MRN: [id number] DOB: [birth year]     LOS: 4   SUBJECTIVE FINDINGS & SIGNIFICANT EVENTS   Patient description:  86 yo male admitted with acute on chronic renal failure with metabolic acidosis/lactic acidosis and septic shock requiring levophed gtt secondary to stage IV sacral ulcer   Lines / Drains: PIV x2  Cultures / Sepsis markers: Blood cultures, COVID-19, nasal MRSA PCR positive Wound culture with multispecies results in process  Antibiotics: Cefepime, Flagyl, vancomycin per pharmacist consultation   Protocols / Consultants: Surgery, PCCM, hospitalist, pharmacy   PAST MEDICAL HISTORY   Past Medical History:  Diagnosis Date  . Atrial fibrillation ([place])   . CKD (chronic kidney disease), stage IIIa   . HTN (hypertension)   . Type II diabetes mellitus with renal manifestations [facility])      SURGICAL HISTORY   Past Surgical History:  Procedure Laterality Date  . APPENDECTOMY    . APPLICATION OF WOUND VAC N/A 07/03/2019   Procedure: APPLICATION OF WOUND VAC;  Surgeon: Herbert Pun, MD;  Location: [facility] ORS;  Service: General;  Laterality: [id number]  . INCISION AND DRAINAGE OF WOUND N/A 07/03/2019   Procedure: IRRIGATION AND DEBRIDEMENT SACRAL ULCER;  Surgeon: Herbert Pun, MD;  Location: [facility] ORS;  Service: General;  Laterality: N/A;  . ORIF FEMUR FRACTURE Left 05/13/2019   Procedure: OPEN REDUCTION INTERNAL FIXATION (ORIF) of closed  left periprosthetic supracondylar femur fracture.;  Surgeon: Corky Mull, MD;  Location: [facility] ORS;  Service: Orthopedics;  Laterality: Left;  . REPLACEMENT TOTAL KNEE Left      FAMILY HISTORY   History reviewed. No pertinent family history.   SOCIAL HISTORY   Social History   Tobacco  Use  . Smoking status: Former Research scientist (life sciences)  . Smokeless tobacco: Never Used  Substance Use Topics  . Alcohol use: Never  . Drug use: Not Currently     MEDICATIONS   Current Medication:  Current Facility-Administered Medications:  .  0.9 %  sodium chloride infusion, 250 mL, Intravenous, Continuous, Ottie Glazier, MD, Stopped at 07/04/19 1458 .  albuterol (PROVENTIL) (2.5 MG/3ML) 0.083% nebulizer solution 2.5 mg, 2.5 mg, Nebulization, TID, Lanney Gins, Yousra Ivens, MD, 2.5 mg at 07/06/19 0735 .  apixaban (ELIQUIS) tablet 2.5 mg, 2.5 mg, Oral, BID, Charlett Nose, RPH, 2.5 mg at 07/05/19 2300 .  aspirin chewable tablet 81 mg, 81 mg, Oral, Daily, Herbert Pun, MD, 81 mg at 07/05/19 0943 .  atropine 1 MG/10ML injection 1 mg, 1 mg, Intravenous, Once, Darel Hong D, NP .  ceFEPIme (MAXIPIME) 2 g in sodium chloride 0.9 % 100 mL IVPB, 2 g, Intravenous, Q24H, Herbert Pun, MD, Stopped at 07/05/19 2124 .  Chlorhexidine Gluconate Cloth 2 % PADS 6 each, 6 each, Topical, Daily, Herbert Pun, MD, 6 each at 07/06/19 0010 .  DOPamine (INTROPIN) 800 mg in dextrose 5 % 250 mL (3.2 mg/mL) infusion, 0-20 mcg/kg/min, Intravenous, Titrated, Bradly Bienenstock, NP, Stopped at 07/06/19 [phone]  ferrous sulfate tablet 325 mg, 325 mg, Oral, BID WC, Herbert Pun, MD, 325 mg at 07/06/19 0810 .  HYDROcodone-acetaminophen (NORCO/VICODIN) 5-325 MG per tablet 1 tablet, 1 tablet, Oral, Q6H PRN, Ottie Glazier, MD, 1 tablet at 07/05/19 0943 .  hydrocortisone sodium succinate (SOLU-CORTEF) 100 MG injection 50 mg, 50 mg, Intravenous, Q6H, Kasa, Kurian, MD, 50 mg at 07/06/19 0521 .  insulin aspart (novoLOG) injection 0-20 Units, 0-20 Units, Subcutaneous, Q4H, Blakeney, Dreama Saa, NP, 4 Units  at 07/06/19 0751 .  insulin glargine (LANTUS) injection 10 Units, 10 Units, Subcutaneous, QHS, Awilda Bill, NP, 10 Units at 07/05/19 2054 .  lactated ringers infusion, , Intravenous, Continuous, Jinelle Butchko,  Adleigh Mcmasters, MD, Last Rate: 150 mL/hr at 07/06/19 0758, New Bag at 07/06/19 0758 .  metroNIDAZOLE (FLAGYL) IVPB 500 mg, 500 mg, Intravenous, Q8H, Cintron-Diaz, Edgardo, MD, Last Rate: 100 mL/hr at 07/06/19 0243, 500 mg at 07/06/19 0243 .  midodrine (PROAMATINE) tablet 5 mg, 5 mg, Oral, TID WC, Deasia Chiu, MD, 5 mg at 07/06/19 0811 .  morphine 2 MG/ML injection 1-2 mg, 1-2 mg, Intravenous, Q4H PRN, Herbert Pun, MD, 2 mg at 07/06/19 0243 .  mupirocin ointment (BACTROBAN) 2 %, , Nasal, BID, Herbert Pun, MD, Given at 07/05/19 2058 .  norepinephrine (LEVOPHED) 16 mg in 293mL premix infusion, 0-40 mcg/min, Intravenous, Titrated, Darel Hong D, NP, Last Rate: 0 mL/hr at 07/06/19 0700, 0 mcg/min at 07/06/19 0700 .  pantoprazole (PROTONIX) injection 40 mg, 40 mg, Intravenous, QHS, Herbert Pun, MD, 40 mg at 07/05/19 2058 .  sodium chloride flush (NS) 0.9 % injection 10-40 mL, 10-40 mL, Intracatheter, Q12H, Lanney Gins, Dekendrick Uzelac, MD, 10 mL at 07/05/19 2200 .  sodium chloride flush (NS) 0.9 % injection 10-40 mL, 10-40 mL, Intracatheter, PRN, Ottie Glazier, MD .  vancomycin (VANCOCIN) IVPB 1000 mg/200 mL premix, 1,000 mg, Intravenous, Q36H, Ottie Glazier, MD, Last Rate: 200 mL/hr at 07/06/19 0753, 1,000 mg at 07/06/19 0753    ALLERGIES   Bee venom, Ezetimibe, Fenofibrate micronized, Pravastatin, and Sulfa antibiotics    REVIEW OF SYSTEMS     10 point ROS conducted is negative except for confusion  PHYSICAL EXAMINATION   Vital Signs: Temp:  [97.1 F (36.2 C)-98.8 F (37.1 C)] 97.1 F (36.2 C) (02/28 0800) Pulse Rate:  [45-109] 51 (02/28 1200) Resp:  [11-19] 18 (02/28 1200) BP: (112-161)/(41-64) 158/53 (02/28 1100) SpO2:  [93 %-100 %] 98 % (02/28 1200) FiO2 (%):  [28 %] 28 % (02/27 1557)  GENERAL: Chronically ill-appearing HEAD: Normocephalic, atraumatic.  EYES: Pupils equal, round, reactive to light.  No scleral icterus.  MOUTH: Moist mucosal membrane. NECK:  Supple. No thyromegaly. No nodules. No JVD.  PULMONARY: Decreased breath sounds bilaterally CARDIOVASCULAR: S1 and S2. Regular rate and rhythm. No murmurs, rubs, or gallops.  GASTROINTESTINAL: Soft, nontender, non-distended. No masses. Positive bowel sounds. No hepatosplenomegaly.  MUSCULOSKELETAL: No swelling, clubbing, or edema.  NEUROLOGIC: Mild altered sensorium with confusion SKIN:intact,warm,dry   PERTINENT DATA     Infusions: . sodium chloride Stopped (07/04/19 1458)  . ceFEPime (MAXIPIME) IV Stopped (07/05/19 2124)  . DOPamine Stopped (07/06/19 0755)  . lactated ringers 150 mL/hr at 07/06/19 0758  . metronidazole 500 mg (07/06/19 0243)  . norepinephrine (LEVOPHED) Adult infusion 0 mcg/min (07/06/19 0700)  . vancomycin 1,000 mg (07/06/19 0753)   Scheduled Medications: . albuterol  2.5 mg Nebulization TID  . apixaban  2.5 mg Oral BID  . aspirin  81 mg Oral Daily  . atropine  1 mg Intravenous Once  . Chlorhexidine Gluconate Cloth  6 each Topical Daily  . ferrous sulfate  325 mg Oral BID WC  . hydrocortisone sod succinate (SOLU-CORTEF) inj  50 mg Intravenous Q6H  . insulin aspart  0-20 Units Subcutaneous Q4H  . insulin glargine  10 Units Subcutaneous QHS  . midodrine  5 mg Oral TID WC  . mupirocin ointment   Nasal BID  . pantoprazole (PROTONIX) IV  40 mg Intravenous QHS  . sodium chloride flush  10-40 mL Intracatheter Q12H   PRN Medications: HYDROcodone-acetaminophen, morphine injection, sodium chloride flush Hemodynamic parameters:   Intake/Output: 02/27 0701 - 02/28 0700 In: 4190.3 [I.V.:3793.7; IV Piggyback:396.7] Out: [id number] [Urine:1450]  Ventilator  Settings: FiO2 (%):  [28 %] 28 %    LAB RESULTS:  Basic Metabolic Panel: Recent Labs  Lab 07/02/19 1628 07/02/19 1628 07/03/19 0510 07/03/19 0510 07/04/19 0531 07/04/19 0531 07/05/19 0418 07/06/19 0515  NA 143  --  145  --  146*  --  143 140  K 4.6   < > 4.7   < > 4.9   < > 4.6 4.1  CL 117*  --  119*   --  122*  --  120* 117*  CO2 15*  --  16*  --  18*  --  18* 14*  GLUCOSE 102*  --  174*  --  213*  --  237* 153*  BUN 74*  --  68*  --  67*  --  61* 47*  CREATININE 3.13*  --  2.67*  --  2.66*  --  2.38* 1.83*  CALCIUM 8.5*  --  7.9*  --  7.8*  --  7.7* 7.7*  MG  --   --  2.1  --   --   --   --   --   PHOS  --   --  4.7*  --   --   --   --   --    < > = values in this interval not displayed.   Liver Function Tests: Recent Labs  Lab 07/02/19 1628  AST 41  ALT 37  ALKPHOS 190*  BILITOT 0.7  PROT 6.6  ALBUMIN 2.2*   No results for input(s): LIPASE, AMYLASE in the last 168 hours. No results for input(s): AMMONIA in the last 168 hours. CBC: Recent Labs  Lab 07/02/19 1628 07/03/19 0510 07/04/19 0531 07/05/19 0418 07/06/19 0515  WBC 13.8* 15.1* 11.1* 13.9* 11.0*  NEUTROABS 10.8*  --   --   --   --   HGB 10.9* 9.3* 8.7* 7.9* 7.0*  HCT 34.6* 28.7* 27.8* 24.3* 22.5*  MCV 105.8* 104.4* 106.5* 104.7* 106.6*  PLT 333 291 290 229 182   Cardiac Enzymes: No results for input(s): CKTOTAL, CKMB, CKMBINDEX, TROPONINI in the last 168 hours. BNP: Invalid input(s): POCBNP CBG: Recent Labs  Lab 07/05/19 1933 07/06/19 0004 07/06/19 0357 07/06/19 0717 07/06/19 1128  GLUCAP 244* 194* 157* 155* 164*     IMAGING RESULTS:  Imaging: DG Chest Port 1 View  Result Date: 07/04/2019 CLINICAL DATA:  Confirm PICC placement EXAM: PORTABLE CHEST 1 VIEW COMPARISON:  07/04/2019, 06/11/2019 FINDINGS: Bilateral interstitial and patchy alveolar airspace opacities. Postsurgical changes in the right lower lung. No focal consolidation. No significant pleural effusion. Small calcified right lower lobe pulmonary nodule likely reflecting sequela prior granulomatous disease. Stable cardiomegaly. No acute osseous abnormality. IMPRESSION: 1. Right-sided PICC line with the tip projecting over the SVC. Electronically Signed   By: Kathreen Devoid   On: 07/04/2019 20:37   ECHOCARDIOGRAM COMPLETE  Result Date:  07/04/2019    ECHOCARDIOGRAM REPORT   Patient Name:   DAQUAVION CAVETT Hardt Date of Exam: 07/04/2019 Medical Rec #:  [id number]     Height:       71.0 in Accession #:    [id number]    Weight:       196.4 lb Date of Birth:  March 05, 1930     BSA:  2.092 m Patient Age:    9 years      BP:           99/65 mmHg Patient Gender: M             HR:           57 bpm. Exam Location:  [facility] Procedure: 2D Echo, Color Doppler and Cardiac Doppler Indications:     CHF- acute diastolic [id number]  History:         Patient has no prior history of Echocardiogram examinations.                  Arrythmias:Atrial Fibrillation; Risk Factors:Hypertension and                  Diabetes. CKD.  Sonographer:     Sherrie Sport RDCS (AE) Referring Phys:  [id number] Ottie Glazier Diagnosing Phys: Ida Rogue MD IMPRESSIONS  1. Left ventricular ejection fraction, by estimation, is 60 to 65%. The left ventricle has normal function. The left ventricle has no regional wall motion abnormalities. Left ventricular diastolic parameters are indeterminate.  2. Right ventricular systolic function is normal. The right ventricular size is normal. There is mild to moderately elevated pulmonary artery systolic pressure.  3. Left atrial size was moderately dilated. FINDINGS  Left Ventricle: Left ventricular ejection fraction, by estimation, is 60 to 65%. The left ventricle has normal function. The left ventricle has no regional wall motion abnormalities. The left ventricular internal cavity size was normal in size. There is  no left ventricular hypertrophy. Left ventricular diastolic parameters are indeterminate. Right Ventricle: The right ventricular size is normal. No increase in right ventricular wall thickness. Right ventricular systolic function is normal. There is moderately elevated pulmonary artery systolic pressure. The tricuspid regurgitant velocity is 3.05 m/s, and with an assumed right atrial pressure of 5 mmHg, the estimated right ventricular systolic pressure  is [id number] mmHg. Left Atrium: Left atrial size was moderately dilated. Right Atrium: Right atrial size was normal in size. Pericardium: There is no evidence of pericardial effusion. Mitral Valve: The mitral valve is normal in structure and function. Normal mobility of the mitral valve leaflets. Mild mitral valve regurgitation. No evidence of mitral valve stenosis. Tricuspid Valve: The tricuspid valve is normal in structure. Tricuspid valve regurgitation is mild . No evidence of tricuspid stenosis. Aortic Valve: The aortic valve is grossly normal. Aortic valve regurgitation is not visualized. No aortic stenosis is present. Aortic valve mean gradient measures 5.0 mmHg. Aortic valve peak gradient measures 9.2 mmHg. Aortic valve area, by VTI measures 2.07 cm. Pulmonic Valve: The pulmonic valve was normal in structure. Pulmonic valve regurgitation is not visualized. No evidence of pulmonic stenosis. Aorta: The aortic root is normal in size and structure. Venous: The inferior vena cava is normal in size with greater than 50% respiratory variability, suggesting right atrial pressure of 3 mmHg. IAS/Shunts: No atrial level shunt detected by color flow Doppler.  LEFT VENTRICLE PLAX 2D LVIDd:         4.99 cm LVIDs:         3.49 cm LV PW:         1.39 cm LV IVS:        0.79 cm LVOT diam:     2.00 cm LV SV:         64 LV SV Index:   31 LVOT Area:     3.14 cm  RIGHT VENTRICLE RV Basal diam:  4.23 cm RV S  prime:     16.10 cm/s TAPSE (M-mode): 3.8 cm LEFT ATRIUM              Index       RIGHT ATRIUM           Index LA diam:        6.00 cm  2.87 cm/m  RA Area:     29.10 cm LA Vol (A2C):   110.0 ml 52.57 ml/m RA Volume:   89.70 ml  42.87 ml/m LA Vol (A4C):   133.0 ml 63.56 ml/m LA Biplane Vol: 132.0 ml 63.08 ml/m  AORTIC VALVE                    PULMONIC VALVE AV Area (Vmax):    2.07 cm     PV Vmax:        0.82 m/s AV Area (Vmean):   2.04 cm     PV Peak grad:   2.7 mmHg AV Area (VTI):     2.07 cm     RVOT Peak grad: 4 mmHg AV  Vmax:           151.50 cm/s AV Vmean:          102.000 cm/s AV VTI:            0.311 m AV Peak Grad:      9.2 mmHg AV Mean Grad:      5.0 mmHg LVOT Vmax:         99.80 cm/s LVOT Vmean:        66.300 cm/s LVOT VTI:          0.205 m LVOT/AV VTI ratio: 0.66  AORTA Ao Root diam: 3.30 cm MITRAL VALVE                TRICUSPID VALVE MV Area (PHT): 2.48 cm     TR Peak grad:   37.2 mmHg MV Decel Time: 306 msec     TR Vmax:        305.00 cm/s MV E velocity: 113.00 cm/s MV A velocity: 113.00 cm/s  SHUNTS MV E/A ratio:  1.00         Systemic VTI:  0.20 m                             Systemic Diam: 2.00 cm Ida Rogue MD Electronically signed by Ida Rogue MD Signature Date/Time: 07/04/2019/6:42:08 PM    Final    [place] EKG SITE RITE  Result Date: 07/04/2019 If Site Rite image not attached, placement could not be confirmed due to current cardiac rhythm.     ASSESSMENT AND PLAN    -Multidisciplinary rounds held today  Septic shock -Likely due to stage IV sacral decubitus -Surgery on case status post debridement -Active wound drainage with approximately 50 cc bloody aspirate -Continue IV antibiotics-currently on Maxipime, Flagyl, vancomycin-appreciate pharmacy -Palliative care consultation-patient is DNR and continuing conversation with family regarding goals of care -Remains on vasopressors and steroids   Bradycardia    - patient unable to clearly communicate symptoms    - has been on dopamine for few days now while being treated for septic shock   - consider cardiology consultaion    Renal Failure-acute on chronic stage II -follow chem 7 -follow UO -continue Foley Catheter-assess need daily -Albumin 1 amp daily -creatinine and GFR is improved 07/05/19   Moderate protein calorie malnutrition  -muscular weakness and albumin  2.2  - dietary RD nutritionist consultation   ID -continue IV abx as prescibed -follow up cultures  GI/Nutrition GI PROPHYLAXIS as indicated DIET-->TF's as  tolerated Constipation protocol as indicated  ENDO - ICU hypoglycemic\Hyperglycemia protocol -check FSBS per protocol   ELECTROLYTES -follow labs as needed -replace as needed -pharmacy consultation   DVT/GI PRX ordered -SCDs  TRANSFUSIONS AS NEEDED MONITOR FSBS ASSESS the need for LABS as needed   Critical care provider statement:    Critical care time (minutes):  33   Critical care time was exclusive of:  Separately billable procedures and treating other patients   Critical care was necessary to treat or prevent imminent or life-threatening deterioration of the following conditions:   Septic shock, sacral decubitus ulcer, multiple comorbid conditions   Critical care was time spent personally by me on the following activities:  Development of treatment plan with patient or surrogate, discussions with consultants, evaluation of patient's response to treatment, examination of patient, obtaining history from patient or surrogate, ordering and performing treatments and interventions, ordering and review of laboratory studies and re-evaluation of patient's condition.  I assumed direction of critical care for this patient from another provider in my specialty: no    This document was prepared using Dragon voice recognition software and may include unintentional dictation errors.    Ottie Glazier, M.D.  Division of [place]

## 2019-07-06 NOTE — Progress Notes (Signed)
Pharmacy Electrolyte Monitoring Consult:  Pharmacy consulted to assist in monitoring and replacing electrolytes in this 86 y.o. male admitted on 07/02/2019 with Wound Infection. Patient currently admitted to ICU and requiring vasopressor support. PICC placement 2/26.   Labs:  Sodium (mmol/L)  Date Value  07/06/2019 140   Potassium (mmol/L)  Date Value  07/06/2019 4.1   Magnesium (mg/dL)  Date Value  07/03/2019 2.1   Phosphorus (mg/dL)  Date Value  07/03/2019 4.7 (H)   Calcium (mg/dL)  Date Value  07/06/2019 7.7 (L)   Albumin (g/dL)  Date Value  07/02/2019 2.2 (L)    Assessment/Plan: 1. Electrolytes: Electrolytes stable, renal function improving. Follow up with morning labs.  2. Glucose: Patient started on stress dose steroids on 2/25. Patient receiving SSI Q4hr and Lantus 10 units Qhs. Will evaluate daily and adjust.   3. Constipation: patient with bowel movement on 2.25. Will continue to monitor.   Pharmacy will continue to monitor and adjust per consult.   Tawnya Crook, PharmD 07/06/2019 8:32 AM

## 2019-07-07 DIAGNOSIS — L89154 Pressure ulcer of sacral region, stage 4: Secondary | ICD-10-CM

## 2019-07-07 DIAGNOSIS — R6520 Severe sepsis without septic shock: Secondary | ICD-10-CM

## 2019-07-07 LAB — GLUCOSE, CAPILLARY
Glucose-Capillary: 128 mg/dL — ABNORMAL HIGH (ref 70–99)
Glucose-Capillary: 148 mg/dL — ABNORMAL HIGH (ref 70–99)
Glucose-Capillary: 170 mg/dL — ABNORMAL HIGH (ref 70–99)
Glucose-Capillary: 178 mg/dL — ABNORMAL HIGH (ref 70–99)
Glucose-Capillary: 187 mg/dL — ABNORMAL HIGH (ref 70–99)
Glucose-Capillary: 219 mg/dL — ABNORMAL HIGH (ref 70–99)
Glucose-Capillary: 283 mg/dL — ABNORMAL HIGH (ref 70–99)

## 2019-07-07 LAB — CULTURE, BLOOD (ROUTINE X 2)
Culture: NO GROWTH
Culture: NO GROWTH
Special Requests: ADEQUATE
Special Requests: ADEQUATE

## 2019-07-07 LAB — BASIC METABOLIC PANEL
Anion gap: 5 (ref 5–15)
BUN: 50 mg/dL — ABNORMAL HIGH (ref 8–23)
CO2: 20 mmol/L — ABNORMAL LOW (ref 22–32)
Calcium: 7.7 mg/dL — ABNORMAL LOW (ref 8.9–10.3)
Chloride: 117 mmol/L — ABNORMAL HIGH (ref 98–111)
Creatinine, Ser: 1.94 mg/dL — ABNORMAL HIGH (ref 0.61–1.24)
GFR calc Af Amer: 35 mL/min — ABNORMAL LOW (ref >60)
GFR calc non Af Amer: 30 mL/min — ABNORMAL LOW (ref >60)
Glucose, Bld: 153 mg/dL — ABNORMAL HIGH (ref 70–99)
Potassium: 3.9 mmol/L (ref 3.5–5.1)
Sodium: 142 mmol/L (ref 135–145)

## 2019-07-07 LAB — AEROBIC/ANAEROBIC CULTURE W GRAM STAIN (SURGICAL/DEEP WOUND): Gram Stain: NONE SEEN

## 2019-07-07 LAB — VANCOMYCIN, RANDOM: Vancomycin Rm: 17

## 2019-07-07 MED ORDER — ALBUTEROL SULFATE (2.5 MG/3ML) 0.083% IN NEBU: 2.5000 mg | INHALATION_SOLUTION | RESPIRATORY_TRACT | Status: DC | PRN

## 2019-07-07 MED ORDER — ATROPINE SULFATE 1 MG/10ML IJ SOSY
PREFILLED_SYRINGE | INTRAMUSCULAR | Status: AC
  Filled 2019-07-07: qty 10

## 2019-07-07 MED ORDER — SENNA 8.6 MG PO TABS
2.0000 | ORAL_TABLET | Freq: Every day | ORAL | Status: DC
  Administered 2019-07-07 – 2019-07-09 (×3): 17.2 mg via ORAL
  Filled 2019-07-07 (×3): qty 2

## 2019-07-07 MED ORDER — INSULIN GLARGINE 100 UNIT/ML ~~LOC~~ SOLN
15.0000 [IU] | Freq: Every day | SUBCUTANEOUS | Status: DC
  Administered 2019-07-07: 15 [IU] via SUBCUTANEOUS
  Filled 2019-07-07 (×2): qty 0.15

## 2019-07-07 NOTE — Progress Notes (Signed)
Pharmacy Electrolyte Monitoring Consult:  Pharmacy consulted to assist in monitoring and replacing electrolytes in this 86 y.o. male admitted on 07/02/2019 with Wound Infection. Patient currently admitted to ICU and requiring vasopressor support. PICC placement 2/26.   Labs:  Sodium (mmol/L)  Date Value  07/07/2019 142   Potassium (mmol/L)  Date Value  07/07/2019 3.9   Magnesium (mg/dL)  Date Value  07/03/2019 2.1   Phosphorus (mg/dL)  Date Value  07/03/2019 4.7 (H)   Calcium (mg/dL)  Date Value  07/07/2019 7.7 (L)   Albumin (g/dL)  Date Value  07/02/2019 2.2 (L)   Corrected Ca: 9.41 mg/dL  Assessment/Plan: 1. Electrolytes: Electrolytes stable, renal function improving. Follow up with morning labs.  2. Glucose:   Patient started on stress dose steroids on 2/25  Patient receiving SSI Q4hr and Lantus 10 units Qhs  Last 24h SSI use: 23 units  Last 24h BG range: 148 - 187  Increase glargine dose to   Will evaluate daily and adjust.   3. Constipation: patient with bowel movement on 2.25  Senna 2 tabs HS starting tonight  Pharmacy will continue to monitor and adjust per consult.   Dallie Piles, PharmD 07/07/2019 7:15 AM

## 2019-07-07 NOTE — Progress Notes (Signed)
Follow up - Critical Care Medicine Note  Patient Details:    Edward Strong is an 86 y.o. male. admitted with acute on chronic renal failure with metabolic acidosis/lactic acidosis and septic shock requiring levophed gtt secondary to infected stage IV sacral decubitus ulcer.   Lines, Airways, Drains: PICC Double Lumen [id number] PICC Right Basilic 41 cm 0 cm (Active)  Indication for Insertion or Continuance of Line Vasoactive infusions 07/07/19 2000  Exposed Catheter (cm) 0 cm 07/05/19 0800  Site Assessment Clean;Dry;Intact 07/05/19 2000  Lumen #1 Status Infusing;Flushed;Blood return noted 07/07/19 1200  Lumen #2 Status Infusing;Flushed;Blood return noted 07/07/19 1200  Dressing Type Transparent 07/07/19 1200  Dressing Status Clean;Dry;Intact;Antimicrobial disc in place 07/07/19 1200  Line Care Connections checked and tightened 07/07/19 1200  Line Adjustment (NICU/IV Team Only) No 07/04/19 2000  Dressing Intervention New dressing 07/04/19 2000  Dressing Change Due 07/11/19 07/06/19 0758     Negative Pressure Wound Therapy Sacrum (Active)  Last dressing change 07/07/19 07/07/19 1000  Site / Wound Assessment Dressing in place / Unable to assess 07/07/19 1000  Peri-wound Assessment Intact 07/06/19 0400  Cycle Continuous 07/07/19 1000  Target Pressure (mmHg) 125 07/07/19 1000  Canister Changed No 07/07/19 1000  Dressing Status Intact 07/07/19 1000  Drainage Amount Scant 07/07/19 1000  Drainage Description Serosanguineous 07/07/19 1000  Output (mL) 0 mL 07/05/19 1615     Urethral Catheter Paige RN  Latex 14 Fr. (Active)  Indication for Insertion or Continuance of Catheter Healing of stage 3 or 4 pressure injuries AND incontinence 07/07/19 2000  Site Assessment Clean;Intact;Dry 07/07/19 2000  Catheter Maintenance Bag below level of bladder;Catheter secured;Drainage bag/tubing not touching floor;Insertion date on drainage bag;No dependent loops;Bag emptied prior to transport 07/07/19 2000   Collection Container Standard drainage bag 07/07/19 2000  Securement Method Securing device (Describe) 07/07/19 2000  Urinary Catheter Interventions (if applicable) Unclamped 0000000 2000  Input (mL) 200 mL 07/04/19 2000  Output (mL) 250 mL 07/07/19 0400    Anti-infectives:  Anti-infectives (From admission, onward)   Start     Dose/Rate Route Frequency Ordered Stop   07/04/19 2000  vancomycin (VANCOCIN) IVPB 1000 mg/200 mL premix     1,000 mg 200 mL/hr over 60 Minutes Intravenous Every 36 hours 07/04/19 1051     07/03/19 1800  ceFEPIme (MAXIPIME) 2 g in sodium chloride 0.9 % 100 mL IVPB     2 g 200 mL/hr over 30 Minutes Intravenous Every 24 hours 07/03/19 0214     07/03/19 0954  vancomycin variable dose per unstable renal function (pharmacist dosing)  Status:  Discontinued      Does not apply See admin instructions 07/03/19 0954 07/04/19 1051   07/03/19 0645  vancomycin (VANCOREADY) IVPB 1250 mg/250 mL     1,250 mg 166.7 mL/hr over 90 Minutes Intravenous  Once 07/03/19 0637 07/03/19 0850   07/02/19 1800  vancomycin (VANCOREADY) IVPB 2000 mg/400 mL     2,000 mg 200 mL/hr over 120 Minutes Intravenous  Once 07/02/19 1706 07/02/19 2058   07/02/19 1715  metroNIDAZOLE (FLAGYL) IVPB 500 mg     500 mg 100 mL/hr over 60 Minutes Intravenous Every 8 hours 07/02/19 1702     07/02/19 1715  ceFEPIme (MAXIPIME) 2 g in sodium chloride 0.9 % 100 mL IVPB     2 g 200 mL/hr over 30 Minutes Intravenous  Once 07/02/19 1706 07/02/19 1748      Microbiology: Results for orders placed or performed during the hospital encounter of 07/02/19  Blood culture (routine x 2)     Status: None   Collection Time: 07/02/19  4:26 PM   Specimen: BLOOD  Result Value Ref Range Status   Specimen Description BLOOD RIGHT ANTECUBITAL  Final   Special Requests   Final    BOTTLES DRAWN AEROBIC AND ANAEROBIC Blood Culture adequate volume   Culture   Final    NO GROWTH 5 DAYS Performed at [facility], [address], [place], [place] [address]    Report Status 07/07/2019 FINAL  Final  Urine culture     Status: None   Collection Time: 07/02/19  5:40 PM   Specimen: Urine, Random  Result Value Ref Range Status   Specimen Description   Final    URINE, RANDOM Performed at [facility], [address] [place], [place] [address]    Special Requests   Final    URINE, RANDOM Performed at [facility], [address], [place], [place] [address]    Culture   Final    NO GROWTH Performed at [facility], [place] [address], [place], [place] [address]    Report Status 07/03/2019 FINAL  Final  Respiratory Panel by RT PCR (Flu A&B, Covid) - Nasopharyngeal Swab     Status: None   Collection Time: 07/02/19  5:48 PM   Specimen: Nasopharyngeal Swab  Result Value Ref Range Status   SARS Coronavirus 2 by RT PCR NEGATIVE NEGATIVE Final    Comment: (NOTE) SARS-CoV-2 target nucleic acids are NOT DETECTED. The SARS-CoV-2 RNA is generally detectable in upper respiratoy specimens during the acute phase of infection. The lowest concentration of SARS-CoV-2 viral copies this assay can detect is 131 copies/mL. A negative result does not preclude SARS-Cov-2 infection and should not be used as the sole basis for treatment or other patient management decisions. A negative result may occur with  improper specimen collection/handling, submission of specimen other than nasopharyngeal swab, presence of viral mutation(s) within the areas targeted by this assay, and inadequate number of viral copies (<131 copies/mL). A negative result must be combined with clinical observations, patient history, and epidemiological information. The expected result is Negative. Fact Sheet for Patients:  PinkCheek.be Fact Sheet for Healthcare Providers:  GravelBags.it This test is not yet ap proved or cleared by the [place] FDA and  has been  authorized for detection and/or diagnosis of SARS-CoV-2 by FDA under an Emergency Use Authorization (EUA). This EUA will remain  in effect (meaning this test can be used) for the duration of the COVID-19 declaration under Section 564(b)(1) of the Act, 21 U.S.C. section 360bbb-3(b)(1), unless the authorization is terminated or revoked sooner.    Influenza A by PCR NEGATIVE NEGATIVE Final   Influenza B by PCR NEGATIVE NEGATIVE Final    Comment: (NOTE) The Xpert Xpress SARS-CoV-2/FLU/RSV assay is intended as an aid in  the diagnosis of influenza from Nasopharyngeal swab specimens and  should not be used as a sole basis for treatment. Nasal washings and  aspirates are unacceptable for Xpert Xpress SARS-CoV-2/FLU/RSV  testing. Fact Sheet for Patients: PinkCheek.be Fact Sheet for Healthcare Providers: GravelBags.it This test is not yet approved or cleared by the [place] FDA and  has been authorized for detection and/or diagnosis of SARS-CoV-2 by  FDA under an Emergency Use Authorization (EUA). This EUA will remain  in effect (meaning this test can be used) for the duration of the  Covid-19 declaration under Section 564(b)(1) of the Act, 21  U.S.C.  section 360bbb-3(b)(1), unless the authorization is  terminated or revoked. Performed at [facility], [address] [place], [place] [address]   Blood culture (routine x 2)     Status: None   Collection Time: 07/02/19  6:16 PM   Specimen: BLOOD  Result Value Ref Range Status   Specimen Description BLOOD LEFT ANTECUBITAL  Final   Special Requests   Final    BOTTLES DRAWN AEROBIC AND ANAEROBIC Blood Culture adequate volume   Culture   Final    NO GROWTH 5 DAYS Performed at [facility], [address] [place], [place] [address]    Report Status 07/07/2019 FINAL  Final  MRSA PCR Screening     Status: Abnormal   Collection Time: 07/03/19  1:29 AM    Specimen: Nasopharyngeal  Result Value Ref Range Status   MRSA by PCR POSITIVE (A) NEGATIVE Final    Comment:        The GeneXpert MRSA Assay (FDA approved for NASAL specimens only), is one component of a comprehensive MRSA colonization surveillance program. It is not intended to diagnose MRSA infection nor to guide or monitor treatment for MRSA infections. RESULT CALLED TO, READ BACK BY AND VERIFIED WITH: Wendie Simmer RN [phone] 07/03/19 HNM Performed at [facility], [address], [place], [place] [address]   Aerobic/Anaerobic Culture (surgical/deep wound)     Status: None   Collection Time: 07/03/19 12:33 PM   Specimen: PATH Other; Tissue  Result Value Ref Range Status   Specimen Description   Final    ULCER SACRAL ULCER Performed at [facility], [address] [place], [place] [address]    Special Requests   Final    NONE Performed at [facility], [address] [place],  [address]    Gram Stain   Final    NO WBC SEEN ABUNDANT GRAM POSITIVE COCCI ABUNDANT GRAM NEGATIVE RODS ABUNDANT GRAM POSITIVE RODS    Culture   Final    MODERATE DIPHTHEROIDS(CORYNEBACTERIUM SPECIES) Standardized susceptibility testing for this organism is not available. FEW BACTEROIDES CACCAE BETA LACTAMASE POSITIVE Performed at [facility], [place] [address], [place],  [address]    Report Status 07/07/2019 FINAL  Final    Best Practice/Protocols:  VTE Prophylaxis: Direct Thrombin Inhibitor   Events:   Studies: DG Sacrum/Coccyx  Result Date: 07/02/2019 CLINICAL DATA:  Deep sacral wound. Concern for osteomyelitis. EXAM: SACRUM AND COCCYX - 2+ VIEW COMPARISON:  None. FINDINGS: On the lateral view, there is apparent soft tissue ulceration dorsal to the distal sacrum and coccyx. No foreign body or definite bone destruction is seen in this area. Osseous evaluation is limited by demineralization and overlying stool on the AP views. The hip  and sacroiliac joint spaces are preserved. There are prominent endplate osteophytes in the lower lumbar spine. IMPRESSION: Soft tissue ulceration dorsal to the distal sacrum and coccyx without radiographic evidence of osteomyelitis. Osseous evaluation is limited by demineralization and overlying stool. Electronically Signed   By: Richardean Sale M.D.   On: 07/02/2019 17:40   US RENAL  Result Date: 06/12/2019 CLINICAL DATA:  Acute renal failure. EXAM: RENAL / URINARY TRACT ULTRASOUND COMPLETE COMPARISON:  None. FINDINGS: Right Kidney: Renal measurements: 11.6 x 5.3 x 6.4 cm = volume: 199 mL. At least 3 simple cysts are noted, the largest measuring 3.3 cm in lower pole. Echogenicity within normal limits. No mass or hydronephrosis visualized. Left Kidney: Renal measurements: 13.9 x 5.1 x 6.0 cm =  volume: 223 mL. Several cysts are noted, the largest measuring 10.5 cm. Echogenicity within normal limits. No hydronephrosis visualized. Bladder: Decompressed secondary to Foley catheter. Other: None. IMPRESSION: Bilateral simple renal cysts are noted, the largest measuring 10.5 cm on the left. No other renal abnormality is noted. Electronically Signed   By: Marijo Conception M.D.   On: 06/12/2019 12:29   DG Chest Port 1 View  Result Date: 07/04/2019 CLINICAL DATA:  Confirm PICC placement EXAM: PORTABLE CHEST 1 VIEW COMPARISON:  07/04/2019, 06/11/2019 FINDINGS: Bilateral interstitial and patchy alveolar airspace opacities. Postsurgical changes in the right lower lung. No focal consolidation. No significant pleural effusion. Small calcified right lower lobe pulmonary nodule likely reflecting sequela prior granulomatous disease. Stable cardiomegaly. No acute osseous abnormality. IMPRESSION: 1. Right-sided PICC line with the tip projecting over the SVC. Electronically Signed   By: Kathreen Devoid   On: 07/04/2019 20:37   DG Chest Port 1 View  Result Date: 07/04/2019 CLINICAL DATA:  Weakness and shortness of breath. EXAM:  PORTABLE CHEST 1 VIEW COMPARISON:  06/11/2019 FINDINGS: The heart is enlarged but appears stable. Stable mediastinal and hilar contours. Persistent right pleural effusion or chronic pleural thickening. Stable surgical scarring changes involving the right lung. Stable appearing bibasilar scarring changes. No definite acute overlying pulmonary findings. IMPRESSION: 1. Stable cardiac enlargement. 2. Stable surgical scarring changes involving the right lung and stable right pleural effusion versus pleural thickening and bibasilar scarring. Electronically Signed   By: Marijo Sanes M.D.   On: 07/04/2019 11:23   DG Chest Port 1 View  Result Date: 06/11/2019 CLINICAL DATA:  Weakness EXAM: PORTABLE CHEST 1 VIEW COMPARISON:  05/12/2019 FINDINGS: Cardiac shadow is enlarged but stable. Aortic calcifications are again seen. Calcified granuloma is again noted in the right mid lung. Postsurgical changes in the right lung are seen as well with basilar scarring. Calcified hilar and mediastinal lymph nodes are seen. No bony abnormality is noted. IMPRESSION: Changes consistent with prior granulomatous disease. No acute bony abnormality is seen. Postsurgical change with chronic scarring in the right base. Electronically Signed   By: Inez Catalina M.D.   On: 06/11/2019 22:46   ECHOCARDIOGRAM COMPLETE  Result Date: 07/04/2019    ECHOCARDIOGRAM REPORT   Patient Name:   RAMESH STHILAIRE Fess Date of Exam: 07/04/2019 Medical Rec #:  [id number]     Height:       71.0 in Accession #:    [id number]    Weight:       196.4 lb Date of Birth:  18-Feb-1930     BSA:          2.092 m Patient Age:    29 years      BP:           99/65 mmHg Patient Gender: M             HR:           57 bpm. Exam Location:  [facility] Procedure: 2D Echo, Color Doppler and Cardiac Doppler Indications:     CHF- acute diastolic [id number]  History:         Patient has no prior history of Echocardiogram examinations.                  Arrythmias:Atrial Fibrillation; Risk  Factors:Hypertension and                  Diabetes. CKD.  Sonographer:     Sherrie Sport RDCS (AE) Referring Phys:  [phone]  Ottie Glazier Diagnosing Phys: Ida Rogue MD IMPRESSIONS  1. Left ventricular ejection fraction, by estimation, is 60 to 65%. The left ventricle has normal function. The left ventricle has no regional wall motion abnormalities. Left ventricular diastolic parameters are indeterminate.  2. Right ventricular systolic function is normal. The right ventricular size is normal. There is mild to moderately elevated pulmonary artery systolic pressure.  3. Left atrial size was moderately dilated. FINDINGS  Left Ventricle: Left ventricular ejection fraction, by estimation, is 60 to 65%. The left ventricle has normal function. The left ventricle has no regional wall motion abnormalities. The left ventricular internal cavity size was normal in size. There is  no left ventricular hypertrophy. Left ventricular diastolic parameters are indeterminate. Right Ventricle: The right ventricular size is normal. No increase in right ventricular wall thickness. Right ventricular systolic function is normal. There is moderately elevated pulmonary artery systolic pressure. The tricuspid regurgitant velocity is 3.05 m/s, and with an assumed right atrial pressure of 5 mmHg, the estimated right ventricular systolic pressure is [id number] mmHg. Left Atrium: Left atrial size was moderately dilated. Right Atrium: Right atrial size was normal in size. Pericardium: There is no evidence of pericardial effusion. Mitral Valve: The mitral valve is normal in structure and function. Normal mobility of the mitral valve leaflets. Mild mitral valve regurgitation. No evidence of mitral valve stenosis. Tricuspid Valve: The tricuspid valve is normal in structure. Tricuspid valve regurgitation is mild . No evidence of tricuspid stenosis. Aortic Valve: The aortic valve is grossly normal. Aortic valve regurgitation is not visualized. No aortic  stenosis is present. Aortic valve mean gradient measures 5.0 mmHg. Aortic valve peak gradient measures 9.2 mmHg. Aortic valve area, by VTI measures 2.07 cm. Pulmonic Valve: The pulmonic valve was normal in structure. Pulmonic valve regurgitation is not visualized. No evidence of pulmonic stenosis. Aorta: The aortic root is normal in size and structure. Venous: The inferior vena cava is normal in size with greater than 50% respiratory variability, suggesting right atrial pressure of 3 mmHg. IAS/Shunts: No atrial level shunt detected by color flow Doppler.  LEFT VENTRICLE PLAX 2D LVIDd:         4.99 cm LVIDs:         3.49 cm LV PW:         1.39 cm LV IVS:        0.79 cm LVOT diam:     2.00 cm LV SV:         64 LV SV Index:   31 LVOT Area:     3.14 cm  RIGHT VENTRICLE RV Basal diam:  4.23 cm RV S prime:     16.10 cm/s TAPSE (M-mode): 3.8 cm LEFT ATRIUM              Index       RIGHT ATRIUM           Index LA diam:        6.00 cm  2.87 cm/m  RA Area:     29.10 cm LA Vol (A2C):   110.0 ml 52.57 ml/m RA Volume:   89.70 ml  42.87 ml/m LA Vol (A4C):   133.0 ml 63.56 ml/m LA Biplane Vol: 132.0 ml 63.08 ml/m  AORTIC VALVE                    PULMONIC VALVE AV Area (Vmax):    2.07 cm     PV Vmax:        0.82  m/s AV Area (Vmean):   2.04 cm     PV Peak grad:   2.7 mmHg AV Area (VTI):     2.07 cm     RVOT Peak grad: 4 mmHg AV Vmax:           151.50 cm/s AV Vmean:          102.000 cm/s AV VTI:            0.311 m AV Peak Grad:      9.2 mmHg AV Mean Grad:      5.0 mmHg LVOT Vmax:         99.80 cm/s LVOT Vmean:        66.300 cm/s LVOT VTI:          0.205 m LVOT/AV VTI ratio: 0.66  AORTA Ao Root diam: 3.30 cm MITRAL VALVE                TRICUSPID VALVE MV Area (PHT): 2.48 cm     TR Peak grad:   37.2 mmHg MV Decel Time: 306 msec     TR Vmax:        305.00 cm/s MV E velocity: 113.00 cm/s MV A velocity: 113.00 cm/s  SHUNTS MV E/A ratio:  1.00         Systemic VTI:  0.20 m                             Systemic Diam: 2.00 cm  Ida Rogue MD Electronically signed by Ida Rogue MD Signature Date/Time: 07/04/2019/6:42:08 PM    Final    DG FEMUR MIN 2 VIEWS LEFT  Result Date: 06/13/2019 CLINICAL DATA:  Left femur ORIF 05/13/2019 EXAM: LEFT FEMUR 2 VIEWS COMPARISON:  None. FINDINGS: Comminuted distal femoral diametaphyseal fracture just above the distal femoral arthroplasty component transfixed with a lateral sideplate and multiple interlocking screws and a single cerclage wire. No hardware failure or complication. Fracture is in near anatomic alignment. Generalized osteopenia. No other fracture or dislocation. Prior total knee arthroplasty in satisfactory position. Postsurgical changes in the surrounding soft tissues. Peripheral vascular atherosclerotic disease. IMPRESSION: Interval distal femoral diametaphyseal ORIF. Electronically Signed   By: Kathreen Devoid   On: 06/13/2019 09:49   [place] EKG SITE RITE  Result Date: 07/04/2019 If [facility] image not attached, placement could not be confirmed due to current cardiac rhythm.   Consults: Treatment Team:  Herbert Pun, MD Yolonda Kida, MD   Subjective:    Overnight Issues: Uneventful night, remains dopamine dependent due to bradycardia.  Remains confused.  Objective:  Vital signs for last 24 hours: Temp:  [97.8 F (36.6 C)-98.2 F (36.8 C)] 98 F (36.7 C) (03/01 1600) Pulse Rate:  [35-88] 49 (03/01 1800) Resp:  [11-27] 13 (03/01 1800) BP: (87-191)/(37-81) 87/39 (03/01 1800) SpO2:  [70 %-100 %] 98 % (03/01 1800)  Hemodynamic parameters for last 24 hours:    Intake/Output from previous day: 02/28 0701 - 03/01 0700 In: 4489.8 [P.O.:360; I.V.:3612.6; IV Piggyback:517.2] Out: 900 [Urine:850]  Intake/Output this shift: No intake/output data recorded.  Vent settings for last 24 hours:    Physical Exam:  GENERAL: Elderly gentleman, confused with regards to time, place.  Oriented to person.  Exhibits some echolalia. HEAD: Normocephalic,  atraumatic.  EYES: Pupils equal, round, reactive to light.  No scleral icterus.  MOUTH: Oral mucosa moist. NECK: Supple. No thyromegaly. No nodules. No JVD.  Trachea midline PULMONARY: Coarse  breath sounds, no wheezes or rhonchi noted. CARDIOVASCULAR: S1 and S2.  Bradycardic rate rate and regular rhythm.  GASTROINTESTINAL: MUSCULOSKELETAL: No joint deformity, no clubbing, no edema.  NEUROLOGIC: No obvious focal deficit, oriented only to self. SKIN: Large stage IV decubitus, sacral, exposed bone, wound VAC placed per surgery PSYCH: Confused, no agitation  Assessment/Plan:   Severe sepsis Septic shock, resolved Due to stage IV sacral decubitus with osteomyelitis Discussed with Dr. Peyton Najjar, debridement of decubitus done, wound VAC in place Continue IV antibiotics-currently on Maxipime, Flagyl, vancomycin de-escalate according to cultures Palliative care consultation-patient is DNR and continuing conversation with family regarding goals of care Recommend transitioning to comfort care Remains on dopamine mostly for bradycardia and stress dose steroids   Bradycardia On dopamine, wean off as tolerated Review MAR and discontinue medications that may aggravate issue Cardiology consultation placed however, patient in my opinion, is not candidate for pacemaker due to active infection   Renal Failure-acute on chronic stage II Follow renal panel  continue Foley Catheter-assess need daily Continues with slow improvement   Severe protein calorie malnutrition Age related sarcopenia Nutritionist following Albumin <2.0   ENDO - ICU hypoglycemic\Hyperglycemia protocol -check FSBS per protocol      LOS: 5 days   Additional comments: Multidisciplinary rounds were performed with the ICU team.  Critical Care Total Time*: Level 3 follow-up  C. Derrill Kay, MD [place] [facility] 07/07/2019  *This note was dictated using voice recognition software/Dragon.  Despite best efforts to  proofread, errors can occur which can change the meaning.  Any change was purely unintentional.

## 2019-07-07 NOTE — Consult Note (Signed)
CARDIOLOGY CONSULT NOTE               Patient ID: Edward Strong MRN: [id number] DOB/AGE: [birth year] 86 y.o.  Admit date: 07/02/2019 Referring Physician: Patsey Berthold, MD   Primary Physician: Toni Arthurs, NP  Primary Cardiologist: Nehemiah Massed, MD  Reason for Consultation: Bradycardia   HPI: Edward Strong is a 86 year old male with PMH significant for HTN, CKD stage IIIa, chronic atrial fibrillation (on Eliquis), CAD, chronic systolic heart failure,  type 2 diabetes, Hodgkin's lymphoma and dementia who presents today with concerns for worsening stage IV sacral ulcer.  The patient was recently hospitalized at [facility] from 2/3-2/8 for AKI secondary to urinary retention and UTI and it was at that time that he was noted to have a sacral pressure ulcer.  On 07/02/2019 the patient was seen as an outpatient at the wound care clinic and was noted to have grossly necrotic stage IV wound over the sacrum and he was sent to the emergency department for further evaluation.  Upon ED arrival he was found to have leukocytosis with a WBC = 13.8, lactate of 3.2>>2.1, creatinine = 3.13 and the x-ray of the sacrum showed soft tissue ulceration dorsal to the distal sacrum and coccyx without radiographic evidence of osteomyelitis. General surgery was consulted for further evaluation. The patient was given 4L IV fluids, IV Flagyl, IV vancomycin and IV cefepime; however, the patient began to develop hypotension and he was started on Levophed gtt and the patient was admitted to the ICU for sepsis with acute renal failure without septic shock.   While in the ICU the patient was managed on the Levophed drip up until 2/27 and on last night (2/28) the patient began to experience episodes bradycardia with a HR as low as 40 bpm and Cardiology was consulted for further evaluation.   Upon assessment today the patient states that he is feeling "okay" and denies any symptoms of chest pain, dyspnea, dizziness or lightheadedness;  however the patient is a poor historian of health and was unable to answer many of the other health related questions. The patient continues to be asymptomatic and his current HR is 69 bpm on the telemetry monitor with a rhythm of atrial fibrillation. The patient did not have any signs of acute distress.   Cardiac Diagnostic Studies: Echocardiogram Complete: (07/04/19)  IMPRESSIONS  1. Left ventricular ejection fraction, by estimation, is 60 to 65%. The  left ventricle has normal function. The left ventricle has no regional  wall motion abnormalities. Left ventricular diastolic parameters are  indeterminate.  2. Right ventricular systolic function is normal. The right ventricular  size is normal. There is mild to moderately elevated pulmonary artery  systolic pressure.  3. Left atrial size was moderately dilated.    Review of systems complete and found to be negative unless listed above   Past Medical History:  Diagnosis Date  . Atrial fibrillation ([place])   . CKD (chronic kidney disease), stage IIIa   . HTN (hypertension)   . Type II diabetes mellitus with renal manifestations [facility])     Past Surgical History:  Procedure Laterality Date  . APPENDECTOMY    . APPLICATION OF WOUND VAC N/A 07/03/2019   Procedure: APPLICATION OF WOUND VAC;  Surgeon: Herbert Pun, MD;  Location: [facility] ORS;  Service: General;  Laterality: [id number]  . INCISION AND DRAINAGE OF WOUND N/A 07/03/2019   Procedure: IRRIGATION AND DEBRIDEMENT SACRAL ULCER;  Surgeon: Herbert Pun, MD;  Location: [facility] ORS;  Service:  General;  Laterality: N/A;  . ORIF FEMUR FRACTURE Left 05/13/2019   Procedure: OPEN REDUCTION INTERNAL FIXATION (ORIF) of closed  left periprosthetic supracondylar femur fracture.;  Surgeon: Corky Mull, MD;  Location: [facility] ORS;  Service: Orthopedics;  Laterality: Left;  . REPLACEMENT TOTAL KNEE Left     Medications Prior to Admission  Medication Sig Dispense Refill Last Dose  .  apixaban (ELIQUIS) 2.5 MG TABS tablet Take 2.5 mg by mouth 2 (two) times daily.   07/02/2019 at 0900  . ascorbic acid (VITAMIN C) 500 MG tablet Take 500 mg by mouth 2 (two) times daily.   07/02/2019 at 0900  . aspirin 81 MG chewable tablet Chew 81 mg by mouth daily.   07/02/2019 at 0900  . bisacodyl (DULCOLAX) 10 MG suppository Place 1 suppository (10 mg total) rectally daily as needed for moderate constipation. 12 suppository 0 Unknown at PRN  . Ensure Max Protein (ENSURE MAX PROTEIN) LIQD Take 330 mLs (11 oz total) by mouth 2 (two) times daily between meals. 330 mL 0   . ferrous sulfate 325 (65 FE) MG tablet Take 1 tablet (325 mg total) by mouth 2 (two) times daily with a meal. 60 tablet 3 07/02/2019 at 0900  . furosemide (LASIX) 40 MG tablet Take 1 tablet (40 mg total) by mouth daily as needed for fluid or edema. 30 tablet  Unknown at PRN  . insulin NPH Human (NOVOLIN N) 100 UNIT/ML injection Inject 0.2 mLs (20 Units total) into the skin every evening. (Patient taking differently: Inject 30 Units into the skin at bedtime. ) 30 mL 11 07/01/2019 at 2100  . insulin regular (NOVOLIN R) 100 units/mL injection Inject 0.05 mLs (5 Units total) into the skin 3 (three) times daily before meals. (Patient taking differently: Inject 10 Units into the skin 3 (three) times daily before meals. ) 10 mL 11 07/02/2019 at 0730  . polyethylene glycol (MIRALAX / GLYCOLAX) 17 g packet Take 17 g by mouth daily. 14 each 0   . therapeutic multivitamin-minerals (THERAGRAN-M) tablet Take 1 tablet by mouth every evening.   07/01/2019 at 1700  . zinc sulfate 220 (50 Zn) MG capsule Take 220 mg by mouth every evening.   07/01/2019 at 2100   Social History   Socioeconomic History  . Marital status: Widowed    Spouse name: Not on file  . Number of children: Not on file  . Years of education: Not on file  . Highest education level: Not on file  Occupational History  . Not on file  Tobacco Use  . Smoking status: Former Research scientist (life sciences)  .  Smokeless tobacco: Never Used  Substance and Sexual Activity  . Alcohol use: Never  . Drug use: Not Currently  . Sexual activity: Not on file  Other Topics Concern  . Not on file  Social History Narrative  . Not on file   Social Determinants of Health   Financial Resource Strain:   . Difficulty of Paying Living Expenses: Not on file  Food Insecurity:   . Worried About Charity fundraiser in the Last Year: Not on file  . Ran Out of Food in the Last Year: Not on file  Transportation Needs:   . Lack of Transportation (Medical): Not on file  . Lack of Transportation (Non-Medical): Not on file  Physical Activity:   . Days of Exercise per Week: Not on file  . Minutes of Exercise per Session: Not on file  Stress:   . Feeling of  Stress : Not on file  Social Connections:   . Frequency of Communication with Friends and Family: Not on file  . Frequency of Social Gatherings with Friends and Family: Not on file  . Attends Religious Services: Not on file  . Active Member of Clubs or Organizations: Not on file  . Attends Archivist Meetings: Not on file  . Marital Status: Not on file  Intimate Partner Violence:   . Fear of Current or Ex-Partner: Not on file  . Emotionally Abused: Not on file  . Physically Abused: Not on file  . Sexually Abused: Not on file    History reviewed. No pertinent family history.   Review of systems complete and found to be negative unless listed above   PHYSICAL EXAM  General: well nourished, in no acute distress HEENT:  Normocephalic and atramatic Neck:  No JVD.  Lungs: Clear bilaterally to auscultation, negative for wheezes, rales or rhonchi Heart: irregular heart rate and rhythm . Normal S1 and S2 without gallops or murmurs.  Abdomen: Bowel sounds are positive, abdomen soft and non-tender  Msk: diminished muscle strength in BLE Extremities: BLE edema, moderate redness, No clubbing or cyanosis  Neuro: Alert and oriented X 1-2. Psych:  flat  affect, responds appropriately at times, but has baseline confusion  Labs:   Lab Results  Component Value Date   WBC 11.0 (H) 07/06/2019   HGB 7.0 (L) 07/06/2019   HCT 22.5 (L) 07/06/2019   MCV 106.6 (H) 07/06/2019   PLT 182 07/06/2019    Recent Labs  Lab 07/02/19 1628 07/03/19 0510 07/07/19 0547  NA 143   < > 142  K 4.6   < > 3.9  CL 117*   < > 117*  CO2 15*   < > 20*  BUN 74*   < > 50*  CREATININE 3.13*   < > 1.94*  CALCIUM 8.5*   < > 7.7*  PROT 6.6  --   --   BILITOT 0.7  --   --   ALKPHOS 190*  --   --   ALT 37  --   --   AST 41  --   --   GLUCOSE 102*   < > 153*   < > = values in this interval not displayed.   No results found for: CKTOTAL, CKMB, CKMBINDEX, TROPONINI No results found for: CHOL No results found for: HDL No results found for: LDLCALC No results found for: TRIG No results found for: CHOLHDL No results found for: LDLDIRECT    Radiology: DG Sacrum/Coccyx  Result Date: 07/02/2019 CLINICAL DATA:  Deep sacral wound. Concern for osteomyelitis. EXAM: SACRUM AND COCCYX - 2+ VIEW COMPARISON:  None. FINDINGS: On the lateral view, there is apparent soft tissue ulceration dorsal to the distal sacrum and coccyx. No foreign body or definite bone destruction is seen in this area. Osseous evaluation is limited by demineralization and overlying stool on the AP views. The hip and sacroiliac joint spaces are preserved. There are prominent endplate osteophytes in the lower lumbar spine. IMPRESSION: Soft tissue ulceration dorsal to the distal sacrum and coccyx without radiographic evidence of osteomyelitis. Osseous evaluation is limited by demineralization and overlying stool. Electronically Signed   By: Richardean Sale M.D.   On: 07/02/2019 17:40   US RENAL  Result Date: 06/12/2019 CLINICAL DATA:  Acute renal failure. EXAM: RENAL / URINARY TRACT ULTRASOUND COMPLETE COMPARISON:  None. FINDINGS: Right Kidney: Renal measurements: 11.6 x 5.3 x 6.4 cm =  volume: 199 mL. At least  3 simple cysts are noted, the largest measuring 3.3 cm in lower pole. Echogenicity within normal limits. No mass or hydronephrosis visualized. Left Kidney: Renal measurements: 13.9 x 5.1 x 6.0 cm = volume: 223 mL. Several cysts are noted, the largest measuring 10.5 cm. Echogenicity within normal limits. No hydronephrosis visualized. Bladder: Decompressed secondary to Foley catheter. Other: None. IMPRESSION: Bilateral simple renal cysts are noted, the largest measuring 10.5 cm on the left. No other renal abnormality is noted. Electronically Signed   By: Marijo Conception M.D.   On: 06/12/2019 12:29   DG Chest Port 1 View  Result Date: 07/04/2019 CLINICAL DATA:  Confirm PICC placement EXAM: PORTABLE CHEST 1 VIEW COMPARISON:  07/04/2019, 06/11/2019 FINDINGS: Bilateral interstitial and patchy alveolar airspace opacities. Postsurgical changes in the right lower lung. No focal consolidation. No significant pleural effusion. Small calcified right lower lobe pulmonary nodule likely reflecting sequela prior granulomatous disease. Stable cardiomegaly. No acute osseous abnormality. IMPRESSION: 1. Right-sided PICC line with the tip projecting over the SVC. Electronically Signed   By: Kathreen Devoid   On: 07/04/2019 20:37   DG Chest Port 1 View  Result Date: 07/04/2019 CLINICAL DATA:  Weakness and shortness of breath. EXAM: PORTABLE CHEST 1 VIEW COMPARISON:  06/11/2019 FINDINGS: The heart is enlarged but appears stable. Stable mediastinal and hilar contours. Persistent right pleural effusion or chronic pleural thickening. Stable surgical scarring changes involving the right lung. Stable appearing bibasilar scarring changes. No definite acute overlying pulmonary findings. IMPRESSION: 1. Stable cardiac enlargement. 2. Stable surgical scarring changes involving the right lung and stable right pleural effusion versus pleural thickening and bibasilar scarring. Electronically Signed   By: Marijo Sanes M.D.   On: 07/04/2019 11:23     DG Chest Port 1 View  Result Date: 06/11/2019 CLINICAL DATA:  Weakness EXAM: PORTABLE CHEST 1 VIEW COMPARISON:  05/12/2019 FINDINGS: Cardiac shadow is enlarged but stable. Aortic calcifications are again seen. Calcified granuloma is again noted in the right mid lung. Postsurgical changes in the right lung are seen as well with basilar scarring. Calcified hilar and mediastinal lymph nodes are seen. No bony abnormality is noted. IMPRESSION: Changes consistent with prior granulomatous disease. No acute bony abnormality is seen. Postsurgical change with chronic scarring in the right base. Electronically Signed   By: Inez Catalina M.D.   On: 06/11/2019 22:46   ECHOCARDIOGRAM COMPLETE  Result Date: 07/04/2019    ECHOCARDIOGRAM REPORT   Patient Name:   Edward Strong Date of Exam: 07/04/2019 Medical Rec #:  [id number]     Height:       71.0 in Accession #:    [id number]    Weight:       196.4 lb Date of Birth:  12/02/1929     BSA:          2.092 m Patient Age:    30 years      BP:           99/65 mmHg Patient Gender: M             HR:           57 bpm. Exam Location:  [facility] Procedure: 2D Echo, Color Doppler and Cardiac Doppler Indications:     CHF- acute diastolic [id number]  History:         Patient has no prior history of Echocardiogram examinations.  Arrythmias:Atrial Fibrillation; Risk Factors:Hypertension and                  Diabetes. CKD.  Sonographer:     Sherrie Sport RDCS (AE) Referring Phys:  [id number] Ottie Glazier Diagnosing Phys: Ida Rogue MD IMPRESSIONS  1. Left ventricular ejection fraction, by estimation, is 60 to 65%. The left ventricle has normal function. The left ventricle has no regional wall motion abnormalities. Left ventricular diastolic parameters are indeterminate.  2. Right ventricular systolic function is normal. The right ventricular size is normal. There is mild to moderately elevated pulmonary artery systolic pressure.  3. Left atrial size was moderately dilated. FINDINGS   Left Ventricle: Left ventricular ejection fraction, by estimation, is 60 to 65%. The left ventricle has normal function. The left ventricle has no regional wall motion abnormalities. The left ventricular internal cavity size was normal in size. There is  no left ventricular hypertrophy. Left ventricular diastolic parameters are indeterminate. Right Ventricle: The right ventricular size is normal. No increase in right ventricular wall thickness. Right ventricular systolic function is normal. There is moderately elevated pulmonary artery systolic pressure. The tricuspid regurgitant velocity is 3.05 m/s, and with an assumed right atrial pressure of 5 mmHg, the estimated right ventricular systolic pressure is [id number] mmHg. Left Atrium: Left atrial size was moderately dilated. Right Atrium: Right atrial size was normal in size. Pericardium: There is no evidence of pericardial effusion. Mitral Valve: The mitral valve is normal in structure and function. Normal mobility of the mitral valve leaflets. Mild mitral valve regurgitation. No evidence of mitral valve stenosis. Tricuspid Valve: The tricuspid valve is normal in structure. Tricuspid valve regurgitation is mild . No evidence of tricuspid stenosis. Aortic Valve: The aortic valve is grossly normal. Aortic valve regurgitation is not visualized. No aortic stenosis is present. Aortic valve mean gradient measures 5.0 mmHg. Aortic valve peak gradient measures 9.2 mmHg. Aortic valve area, by VTI measures 2.07 cm. Pulmonic Valve: The pulmonic valve was normal in structure. Pulmonic valve regurgitation is not visualized. No evidence of pulmonic stenosis. Aorta: The aortic root is normal in size and structure. Venous: The inferior vena cava is normal in size with greater than 50% respiratory variability, suggesting right atrial pressure of 3 mmHg. IAS/Shunts: No atrial level shunt detected by color flow Doppler.  LEFT VENTRICLE PLAX 2D LVIDd:         4.99 cm LVIDs:         3.49 cm  LV PW:         1.39 cm LV IVS:        0.79 cm LVOT diam:     2.00 cm LV SV:         64 LV SV Index:   31 LVOT Area:     3.14 cm  RIGHT VENTRICLE RV Basal diam:  4.23 cm RV S prime:     16.10 cm/s TAPSE (M-mode): 3.8 cm LEFT ATRIUM              Index       RIGHT ATRIUM           Index LA diam:        6.00 cm  2.87 cm/m  RA Area:     29.10 cm LA Vol (A2C):   110.0 ml 52.57 ml/m RA Volume:   89.70 ml  42.87 ml/m LA Vol (A4C):   133.0 ml 63.56 ml/m LA Biplane Vol: 132.0 ml 63.08 ml/m  AORTIC VALVE  PULMONIC VALVE AV Area (Vmax):    2.07 cm     PV Vmax:        0.82 m/s AV Area (Vmean):   2.04 cm     PV Peak grad:   2.7 mmHg AV Area (VTI):     2.07 cm     RVOT Peak grad: 4 mmHg AV Vmax:           151.50 cm/s AV Vmean:          102.000 cm/s AV VTI:            0.311 m AV Peak Grad:      9.2 mmHg AV Mean Grad:      5.0 mmHg LVOT Vmax:         99.80 cm/s LVOT Vmean:        66.300 cm/s LVOT VTI:          0.205 m LVOT/AV VTI ratio: 0.66  AORTA Ao Root diam: 3.30 cm MITRAL VALVE                TRICUSPID VALVE MV Area (PHT): 2.48 cm     TR Peak grad:   37.2 mmHg MV Decel Time: 306 msec     TR Vmax:        305.00 cm/s MV E velocity: 113.00 cm/s MV A velocity: 113.00 cm/s  SHUNTS MV E/A ratio:  1.00         Systemic VTI:  0.20 m                             Systemic Diam: 2.00 cm Ida Rogue MD Electronically signed by Ida Rogue MD Signature Date/Time: 07/04/2019/6:42:08 PM    Final    DG FEMUR MIN 2 VIEWS LEFT  Result Date: 06/13/2019 CLINICAL DATA:  Left femur ORIF 05/13/2019 EXAM: LEFT FEMUR 2 VIEWS COMPARISON:  None. FINDINGS: Comminuted distal femoral diametaphyseal fracture just above the distal femoral arthroplasty component transfixed with a lateral sideplate and multiple interlocking screws and a single cerclage wire. No hardware failure or complication. Fracture is in near anatomic alignment. Generalized osteopenia. No other fracture or dislocation. Prior total knee arthroplasty in  satisfactory position. Postsurgical changes in the surrounding soft tissues. Peripheral vascular atherosclerotic disease. IMPRESSION: Interval distal femoral diametaphyseal ORIF. Electronically Signed   By: Kathreen Devoid   On: 06/13/2019 09:49   [place] EKG SITE RITE  Result Date: 07/04/2019 If [facility] image not attached, placement could not be confirmed due to current cardiac rhythm.   EKG: atrial fibrillation   ASSESSMENT AND PLAN:  1. Bradycardia 2. Hypotension 3. Atrial Fibrillation  4. Chronic systolic HF 5. Acute on chronic kidney disease 6. Type II diabetes 7. Sepsis likely secondary to Stage IV decubitus sacral ulcer  Plan: 1.  Bradycardia, stable, patient is asymptomatic at this time, current heart rate is 69 bpm.  -Recommend tapering off of dopamine gtt.  -Please continue to hold Midodrine at this time.     -Avoid any beta-blockers or calcium channel blockers at this time.  2.  Hypotension, stable, patient is normotensive at this time with most recent BP = 144/64.  - Please hold midodrine at this time due to episodes of severe bradycardia.  -Continue to closely monitor telemetry.  3. Atrial Fibrillation, stable, rate controlled.   -Please continue anticoagulation with eliquis and monitor for s/s of bleeding.  4. Chronic systolic HF, reasonably stable at this time, most  recent EF= 60-65% as confirmed by echocardiogram, patient is asymptomatic at this time.  - Please continue Lasix 40mg  daily as needed for edema or dyspnea.  -Please continue careful fluid resuscitation only as necessary in the setting of sepsis in order to prevent fluid volume overload.   5. Acute on chronic kidney disease, reasonably stable at this time, recent BUN=50 and creatinine= 1.94.   -Monitor fluid administration closely  -Avoid any nephrotoxic agents at this time.   6. Type II diabetes, insulin-dependent, reasonably controlled, current CBG=128.   -Please continue glucose management with sliding  scale insulin per protocol   7. Sepsis likely secondary to Stage IV decubitus sacral ulcer s/p irrigation and debridement on 2/25, stable, followed by General surgery.   -Please continue cefepime, Flagyl, vancomycin per pharmacy's consultation.     Signed: Gladstone Pih NP, ACNPC-AG 07/07/2019, 10:28 AM

## 2019-07-07 NOTE — Consult Note (Addendum)
[place] team requested to begin Vac dressing changes on Wed to stage 4 pressure injury, which was present on admission.  Pt had debridement in the OR.  Pt is followed by the surgical team for assessment and plan of care and they changed the dressing today. Current dressing is intact with good seal at 158m cont suction with mod amt pink drainage in the cannister. Julien Girt MSN, RN, [place], [place], [place]

## 2019-07-07 NOTE — Progress Notes (Signed)
Edward Strong visited pt. while rounding on ICU; pt. in bed, awake when Edward Strong arrived; pt.'s speech is mumbled, but seemed relatively coherent when understandable.  Pt. seemed to remember a former acquaintance who was a chaplain when Edward Strong introduced himself.  CH offered to pray for pt. and pt. was agreeable and grateful; pt. remained silent w/eyes closed during prayer.  No further needs expressed.           07/07/19 1600  Clinical Encounter Type  Visited With Patient  Visit Type Initial;Social support;Spiritual support;Psychological support  Referral From Nurse

## 2019-07-07 NOTE — Progress Notes (Signed)
[place] Hospital Day(s): 5.   Post op day(s): 4 Days Post-Op.   Interval History: Patient seen and examined, no acute events or new complaints overnight. No issue with wound VAC.   Vital signs in last 24 hours: [min-max] current  Temp:  [97.4 F (36.3 C)-98.2 F (36.8 C)] 98 F (36.7 C) (03/01 0800) Pulse Rate:  [40-103] 78 (03/01 0800) Resp:  [11-27] 19 (03/01 0800) BP: (99-191)/(47-76) 186/63 (03/01 0800) SpO2:  [93 %-100 %] 99 % (03/01 0813)     Height: 5\' 11"  (180.3 cm) Weight: 89.1 kg BMI (Calculated): 27.41    Physical Exam:  Constitutional: alert, cooperative and no distress  Respiratory: breathing non-labored at rest  Cardiovascular: regular rate and sinus rhythm  Gastrointestinal: soft, non-tender, and non-distended Back: as seen in photos. Minimal ischemic tissue with no purulence. No cellulitis.   Pre debridement:     Four days post debridement:         Labs:  CBC Latest Ref Rng & Units 07/06/2019 07/05/2019 07/04/2019  WBC 4.0 - 10.5 K/uL 11.0(H) 13.9(H) 11.1(H)  Hemoglobin 13.0 - 17.0 g/dL 7.0(L) 7.9(L) 8.7(L)  Hematocrit 39.0 - 52.0 % 22.5(L) 24.3(L) 27.8(L)  Platelets 150 - 400 K/uL 182 229 290   CMP Latest Ref Rng & Units 07/07/2019 07/06/2019 07/05/2019  Glucose 70 - 99 mg/dL 153(H) 153(H) 237(H)  BUN 8 - 23 mg/dL 50(H) 47(H) 61(H)  Creatinine 0.61 - 1.24 mg/dL 1.94(H) 1.83(H) 2.38(H)  Sodium 135 - 145 mmol/L 142 140 143  Potassium 3.5 - 5.1 mmol/L 3.9 4.1 4.6  Chloride 98 - 111 mmol/L 117(H) 117(H) 120(H)  CO2 22 - 32 mmol/L 20(L) 14(L) 18(L)  Calcium 8.9 - 10.3 mg/dL 7.7(L) 7.7(L) 7.7(L)  Total Protein 6.5 - 8.1 g/dL - - -  Total Bilirubin 0.3 - 1.2 mg/dL - - -  Alkaline Phos 38 - 126 U/L - - -  AST 15 - 41 U/L - - -  ALT 0 - 44 U/L - - -    Imaging studies: No new pertinent imaging studies   Assessment/Plan:  86 y.o.malewith infected stage 4 sacral ulcer4 Day Post-Ops/p cleansing and debridement and negative  pressure dressing, complicated by pertinent comorbidities includinga fib on anticoagulation, htn, CKD stage 3a, diabetes mellitus type 2.  From ulcer standpoint patient is healing very slowly. There is no purulent drainage. There is minimal ischemic tissue. I personally changes the wound VAC system today.   From medical standpoint patient without significant improvement. Still with low dose dopamine due to bradycardia.   Patient needs frequent change in positions to avoid pressure on sacral area. Patient will also benefit of air mattress. I will consult wound care for future Wound VAC changes and management.    Arnold Long, MD

## 2019-07-07 NOTE — Progress Notes (Signed)
Discussed patients POC with patients son Edward Strong. Explained that we are having a hard time weaning the dopamine and that cardiology has been consulted. Palliative care involved in patients care. I asked Edward Strong if the family had discussed his care. I explained while he is doing fairly well on the dopamine that it is not a permanent medication and that we are hoping to see an improvement but that he is still very ill. Edward Strong understands and was tearful in room. He explained that he understands how sick he is and that he has a lot of prior issues before this hospitalization. I offered emotional support and explained that we are doing what we can to treat him. I also explained that if he wanted to discuss future plans with family about POC now would be a good time.

## 2019-07-08 DIAGNOSIS — E440 Moderate protein-calorie malnutrition: Secondary | ICD-10-CM | POA: Insufficient documentation

## 2019-07-08 LAB — RENAL FUNCTION PANEL
Albumin: 1.9 g/dL — ABNORMAL LOW (ref 3.5–5.0)
Anion gap: 5 (ref 5–15)
BUN: 45 mg/dL — ABNORMAL HIGH (ref 8–23)
CO2: 20 mmol/L — ABNORMAL LOW (ref 22–32)
Calcium: 7.7 mg/dL — ABNORMAL LOW (ref 8.9–10.3)
Chloride: 118 mmol/L — ABNORMAL HIGH (ref 98–111)
Creatinine, Ser: 1.73 mg/dL — ABNORMAL HIGH (ref 0.61–1.24)
GFR calc Af Amer: 40 mL/min — ABNORMAL LOW (ref >60)
GFR calc non Af Amer: 35 mL/min — ABNORMAL LOW (ref >60)
Glucose, Bld: 157 mg/dL — ABNORMAL HIGH (ref 70–99)
Phosphorus: 2.8 mg/dL (ref 2.5–4.6)
Potassium: 3.6 mmol/L (ref 3.5–5.1)
Sodium: 143 mmol/L (ref 135–145)

## 2019-07-08 LAB — GLUCOSE, CAPILLARY
Glucose-Capillary: 150 mg/dL — ABNORMAL HIGH (ref 70–99)
Glucose-Capillary: 135 mg/dL — ABNORMAL HIGH (ref 70–99)
Glucose-Capillary: 169 mg/dL — ABNORMAL HIGH (ref 70–99)
Glucose-Capillary: 215 mg/dL — ABNORMAL HIGH (ref 70–99)
Glucose-Capillary: 186 mg/dL — ABNORMAL HIGH (ref 70–99)

## 2019-07-08 LAB — CBC
HCT: 25.6 % — ABNORMAL LOW (ref 39.0–52.0)
Hemoglobin: 8.2 g/dL — ABNORMAL LOW (ref 13.0–17.0)
MCH: 33.5 pg (ref 26.0–34.0)
MCHC: 32 g/dL (ref 30.0–36.0)
MCV: 104.5 fL — ABNORMAL HIGH (ref 80.0–100.0)
Platelets: 174 10*3/uL (ref 150–400)
RBC: 2.45 MIL/uL — ABNORMAL LOW (ref 4.22–5.81)
RDW: 14.9 % (ref 11.5–15.5)
WBC: 11.2 10*3/uL — ABNORMAL HIGH (ref 4.0–10.5)
nRBC: 0.4 % — ABNORMAL HIGH (ref 0.0–0.2)

## 2019-07-08 LAB — MAGNESIUM: Magnesium: 1.7 mg/dL (ref 1.7–2.4)

## 2019-07-08 MED ORDER — ENSURE MAX PROTEIN PO LIQD
11.0000 [oz_av] | Freq: Two times a day (BID) | ORAL | Status: DC
  Administered 2019-07-08 – 2019-07-11 (×4): 11 [oz_av] via ORAL
  Filled 2019-07-08: qty 330

## 2019-07-08 MED ORDER — INSULIN ASPART 100 UNIT/ML ~~LOC~~ SOLN: 0.0000 [IU] | Freq: Every day | SUBCUTANEOUS | Status: DC

## 2019-07-08 MED ORDER — SODIUM CHLORIDE 0.9 % IV SOLN
3.0000 g | Freq: Four times a day (QID) | INTRAVENOUS | Status: DC
  Administered 2019-07-08 – 2019-07-09 (×4): 3 g via INTRAVENOUS
  Filled 2019-07-08 (×5): qty 8
  Filled 2019-07-08 (×2): qty 3
  Filled 2019-07-08: qty 8

## 2019-07-08 MED ORDER — PANTOPRAZOLE SODIUM 40 MG PO TBEC
40.0000 mg | DELAYED_RELEASE_TABLET | Freq: Every day | ORAL | Status: DC
  Administered 2019-07-08 – 2019-07-10 (×3): 40 mg via ORAL
  Filled 2019-07-08 (×3): qty 1

## 2019-07-08 MED ORDER — CHLORHEXIDINE GLUCONATE CLOTH 2 % EX PADS
6.0000 | MEDICATED_PAD | Freq: Every day | CUTANEOUS | Status: DC
  Administered 2019-07-08 – 2019-07-10 (×3): 6 via TOPICAL

## 2019-07-08 MED ORDER — ADULT MULTIVITAMIN W/MINERALS CH
1.0000 | ORAL_TABLET | Freq: Every day | ORAL | Status: DC
  Administered 2019-07-09: 1 via ORAL
  Filled 2019-07-08: qty 1

## 2019-07-08 MED ORDER — INSULIN ASPART 100 UNIT/ML ~~LOC~~ SOLN
0.0000 [IU] | Freq: Three times a day (TID) | SUBCUTANEOUS | Status: DC
  Administered 2019-07-08: 3 [IU] via SUBCUTANEOUS
  Filled 2019-07-08: qty 1

## 2019-07-08 MED ORDER — JUVEN PO PACK
1.0000 | PACK | Freq: Two times a day (BID) | ORAL | Status: DC
  Administered 2019-07-09: 1 via ORAL

## 2019-07-08 MED ORDER — MAGNESIUM SULFATE 2 GM/50ML IV SOLN
2.0000 g | Freq: Once | INTRAVENOUS | Status: AC
  Administered 2019-07-08: 2 g via INTRAVENOUS
  Filled 2019-07-08: qty 50

## 2019-07-08 MED ORDER — ASCORBIC ACID 500 MG PO TABS
500.0000 mg | ORAL_TABLET | Freq: Two times a day (BID) | ORAL | Status: DC
  Administered 2019-07-08 – 2019-07-09 (×2): 500 mg via ORAL
  Filled 2019-07-08 (×2): qty 1

## 2019-07-08 MED ORDER — INSULIN GLARGINE 100 UNIT/ML ~~LOC~~ SOLN
10.0000 [IU] | Freq: Every day | SUBCUTANEOUS | Status: DC
  Administered 2019-07-08: 10 [IU] via SUBCUTANEOUS
  Filled 2019-07-08 (×2): qty 0.1

## 2019-07-08 NOTE — Progress Notes (Addendum)
[facility] Cardiology  Patient description: [name] is a 86 year old male with PMH significant for HTN, CKD stage IIIa, chronic atrial fibrillation (on Eliquis), CAD, chronic systolic heart failure,  type 2 diabetes, Hodgkin's lymphoma and dementia who was admitted for acute on chronic renal failure with lactic acidosis and sepsis secondary to stage IV decubitus sacral ulcer were further complications of hypotension and bradycardia.   SUBJECTIVE: The patient reports to be doing well on today. He denies any dyspnea, dizziness, syncope or chest pain at this time. The patient reports that he was able to sleep well on last night. The patient continues to have episodes of asymptomatic bradycardia.    Vitals:   07/08/19 0400 07/08/19 0500 07/08/19 0600 07/08/19 0800  BP: 108/75 (!) 107/54 (!) 120/55   Pulse: (!) 40 (!) 44 (!) 43   Resp: 14 14 11    Temp: 97.7 F (36.5 C)   98.2 F (36.8 C)  TempSrc: Oral     SpO2: 100% 99% 97%   Weight:      Height:         Intake/Output Summary (Last 24 hours) at 07/08/2019 0805 Last data filed at 07/08/2019 0100 Gross per 24 hour  Intake 360 ml  Output 900 ml  Net -540 ml      PHYSICAL EXAM  General: well nourished, in no acute distress HEENT:  Normocephalic and atramatic Neck:  No JVD.  Lungs: Clear bilaterally to auscultation Heart: irregular heart rate and rhythm. S1 and S2 auscultated without gallops or murmurs.  Abdomen: Bowel sounds are positive, abdomen soft and non-tender  Msk:  Diminished strength in all extremities. Extremities: diminished strength in all extremities, non-pitting edema noted to all extremities, No clubbing, cyanosis    Neuro: Alert and oriented X 2 Psych:  Good affect, responds appropriately at times, delayed responses   LABS: Basic Metabolic Panel: Recent Labs    07/07/19 0547 07/08/19 0500 07/08/19 0501  NA 142 143  --   K 3.9 3.6  --   CL 117* 118*  --   CO2 20* 20*  --   GLUCOSE 153* 157*  --   BUN 50*  45*  --   CREATININE 1.94* 1.73*  --   CALCIUM 7.7* 7.7*  --   MG  --   --  1.7  PHOS  --  2.8  --    Liver Function Tests: Recent Labs    07/08/19 0500  ALBUMIN 1.9*   No results for input(s): LIPASE, AMYLASE in the last 72 hours. CBC: Recent Labs    07/06/19 0515 07/08/19 0501  WBC 11.0* 11.2*  HGB 7.0* 8.2*  HCT 22.5* 25.6*  MCV 106.6* 104.5*  PLT 182 174   Cardiac Enzymes: No results for input(s): CKTOTAL, CKMB, CKMBINDEX, TROPONINI in the last 72 hours. BNP: Invalid input(s): POCBNP D-Dimer: No results for input(s): DDIMER in the last 72 hours. Hemoglobin A1C: No results for input(s): HGBA1C in the last 72 hours. Fasting Lipid Panel: No results for input(s): CHOL, HDL, LDLCALC, TRIG, CHOLHDL, LDLDIRECT in the last 72 hours. Thyroid Function Tests: No results for input(s): TSH, T4TOTAL, T3FREE, THYROIDAB in the last 72 hours.  Invalid input(s): FREET3 Anemia Panel: No results for input(s): VITAMINB12, FOLATE, FERRITIN, TIBC, IRON, RETICCTPCT in the last 72 hours.  No results found.   Echocardiogram: IMPRESSIONS  1. Left ventricular ejection fraction, by estimation, is 60 to 65%. The  left ventricle has normal function. The left ventricle has no regional  wall motion  abnormalities. Left ventricular diastolic parameters are  indeterminate.  2. Right ventricular systolic function is normal. The right ventricular  size is normal. There is mild to moderately elevated pulmonary artery  systolic pressure.  3. Left atrial size was moderately dilated.   TELEMETRY: atrial fibrillation   ASSESSMENT AND PLAN:  Principal Problem:   Sepsis ([place]) Active Problems:   Type II diabetes mellitus with renal manifestations (HCC)   CKD (chronic kidney disease), stage IIIa   Atrial fibrillation (HCC)   Chronic systolic CHF (congestive heart failure) (HCC)   AKI (acute kidney injury) ([place])   Sacral ulcer (HCC)   Sacral decubitus ulcer, stage IV ([place])   Goals of care,  counseling/discussion   Palliative care by specialist   Plan:   1. Bradycardia, asymptomatic, patient's lowest HR overnight was 40 bpm, current HR is 56.  -Recommend tapering off dopamine gtt  -Pacemaker not recommended at this time   -Please continue to hold Midodrine at this time in the setting of bradycardia  2. Hypotension, stable, patient is normotensive and being maintained on dopamine gtt at 66mcg/kg/min at this time,             - Please hold midodrine at this time in the presence of bradycardia.             -Continue to closely monitor telemetry.  3. Atrial Fibrillation, stable, rate controlled.              -Please continue anticoagulation with eliquis and monitor for s/s of bleeding.  4. Chronic systolic HF, reasonably stable at this time, most recent EF= 60-65% as confirmed by echocardiogram, patient appears edematous on today, +2 non-pitting edema noted to all extremities.               - Please continue Lasix 40mg  daily as needed for edema or dyspnea.             - Please continue daily weights, strict I&O's and careful administration of fluids.    5. Acute on chronic kidney disease, reasonably stable at this time, recent BUN=45 and creatinine= 1.73.              -Monitor fluid administration closely             -Avoid any nephrotoxic agents at this time.   -Continue monitoring Kidney function   6. Type II diabetes, insulin-dependent, reasonably controlled, current CBG=135.              -Please continue glucose management with sliding scale insulin per protocol   7. Sepsis likely secondary to Stage IV decubitus sacral ulcer s/p irrigation and debridement on 2/25, stable, followed by General surgery.              -Please continue cefepime, Flagyl, vancomycin per pharmacy's consultation.  -Please continue WOC recommendations with wound vac   Discussed with Dr. Clayborn Bigness who also evaluated the patient and the plan was made in collaboration with him.    [place], APRN, ACNPC-AG  07/08/2019 8:05 AM

## 2019-07-08 NOTE — Progress Notes (Signed)
Pharmacy Electrolyte Monitoring Consult:  Pharmacy consulted to assist in monitoring and replacing electrolytes in this 86 y.o. male admitted on 07/02/2019 with Wound Infection. PMH significant forHTN,CKD stage IIIa,chronic atrial fibrillation(on Eliquis),CAD, chronic systolic heart failure, type 2 diabetes, Hodgkin's lymphoma and dementia who was admitted for acute on chronic renal failure with lactic acidosis and sepsis secondary to stage IV decubitus sacral ulcer were further complications of hypotension and bradycardia.   Labs:  Sodium (mmol/L)  Date Value  07/08/2019 143   Potassium (mmol/L)  Date Value  07/08/2019 3.6   Magnesium (mg/dL)  Date Value  07/08/2019 1.7   Phosphorus (mg/dL)  Date Value  07/08/2019 2.8   Calcium (mg/dL)  Date Value  07/08/2019 7.7 (L)   Albumin (g/dL)  Date Value  07/08/2019 1.9 (L)   Corrected Ca: 9.41 mg/dL  Assessment/Plan: 1. Electrolytes:   Magnesium is borderline: replace with 2 grams IV magnesium sulfate x 1  Other electrolytes stable, renal function improving  Follow up with morning labs.  2. Glucose:   Patient was started on stress dose steroids on 2/25, which were discontinued today  Patient receiving sSSI ACHS and Lantus 10 units Qhs  Last 24h SSI use: 23 units  Last 24h BG range: 128 - 283  decrease glargine dose back to 10 units/day  Will evaluate daily and adjust.   3. Constipation: patient with bowel movement on 2.25  Senna 2 tabs HS started 3/1  Pharmacy will continue to monitor and adjust per consult.   Dallie Piles, PharmD 07/08/2019 7:16 AM

## 2019-07-08 NOTE — Evaluation (Signed)
Occupational Therapy Evaluation Patient Details Name: Edward Strong MRN: [id number] DOB: 08/13/1929 Today's Date: 07/08/2019    History of Present Illness [name] is a 86 year old male with PMH significant for HTN, CKD, chronic A Fib, CAD, chronic systolic heart failure,  type 2 diabetes, Hodgkin's lymphoma and dementia who presents today with concerns for worsening stage IV sacral ulcer. The patient was recently hospitalized at [facility] from 2/3-2/8 for AKI secondary to urinary retention and UTI and it was at that time that he was noted to have a sacral pressure ulcer.  On 07/02/2019 the patient was seen as an OP at the wound care clinic and was noted to have grossly necrotic stage IV wound over the sacrum and he was sent to the ED for further evaluation. Admitted to ICU for sepsis with acute renal failure without septic shock and bradycardia. OT consulted for self-feeding/AE.   Clinical Impression   Patient seen for OT evaluation this date d/t consult order.  Per nurse, pt having a difficult time with self feeding and bringing his food to mouth.  Patient is currently working with palliative care.  Discussed goals of OT and need for evaluation with palliative care NP and pt's son, [name].  Both agree OT evaluation would be beneficial.  Patient presents with decreased grip strength, dexterity and hand-eye coordination, hindering ability to perform self feeding effectively.  Per son, patient has had neuropathy for years but patient denies any numbness or tingling in hands. Education provided on postioning of B UEs and potential AE to improve self feeding and ADLs. The patient would benefit from continued skilled therapy to increase overall comfort and level of independence to reduce caregiver burden.  Based on today's performance, recommending SNF placement at discharge.  Family meeting to be held 07/09/19 to discuss appropriate discharge positions with pt's sons.      Follow Up Recommendations   SNF(Patient would benefit from assistance 24/7)    Equipment Recommendations  Other (comment)(Defer to next level of care)    Recommendations for Other Services       Precautions / Restrictions Precautions Precautions: Fall;Other (comment) Precaution Comments: MRSA Restrictions Weight Bearing Restrictions: No Other Position/Activity Restrictions: No WB restrictions noted in orders.  HOwever, NWB 3 weeks ago with L knee immobilizer.      Mobility Bed Mobility               General bed mobility comments: MAX A needed to position self in midline in bed.  Transfers                 General transfer comment: Unable to assess this date.    Balance                                           ADL either performed or assessed with clinical judgement   ADL Overall ADL's : Needs assistance/impaired Eating/Feeding: Moderate assistance;Cueing for sequencing;Cueing for compensatory techinques;Bed level Eating/Feeding Details (indicate cue type and reason): Poor eye/hand coordination noted and grasp ability                                         Vision Patient Visual Report: No change from baseline Additional Comments: Able to track therapist within room     Perception  Praxis      Pertinent Vitals/Pain Pain Assessment: No/denies pain     Hand Dominance Right   Extremity/Trunk Assessment Upper Extremity Assessment Upper Extremity Assessment: Generalized weakness           Communication Communication Communication: HOH;Other (comment)(Confusion noted; memory deficits)   Cognition Arousal/Alertness: Awake/alert Behavior During Therapy: WFL for tasks assessed/performed Overall Cognitive Status: History of cognitive impairments - at baseline                                 General Comments: Poor memory noted, frequently asking therapist's name and where he was.   General Comments  OT primarily consulted  for self feeding this date.  Palliative care is working with pt currently.    Exercises Other Exercises Other Exercises: Educated patient in ability to postion self in midline in bed. Other Exercises: Educated patient on positioning of B UEs to perform self feeding effectively. Other Exercises: PRovided education to son, [name], regarding OT goals   Shoulder Instructions      Home Living Family/patient expects to be discharged to:: Unsure                                 Additional Comments: Family meeting in place for 07/09/19 to discuss appropriate discharge postion.      Prior Functioning/Environment Level of Independence: Independent with assistive device(s)        Comments: was previously independent, living alone, however has spent the last month in SNF and hasn't been OOB per son. Unsure if he was receiving rehab services        OT Problem List: Decreased strength;Decreased activity tolerance;Decreased cognition;Impaired sensation;Pain      OT Treatment/Interventions: Self-care/ADL training;Therapeutic exercise;Therapeutic activities;DME and/or AE instruction;Patient/family education    OT Goals(Current goals can be found in the care plan section) Acute Rehab OT Goals Patient Stated Goal: Unable to state.  Per son, "feel as good as he can" OT Goal Formulation: With patient/family Time For Goal Achievement: 07/22/19 Potential to Achieve Goals: Good  OT Frequency: Min 1X/week   Barriers to D/C:            Co-evaluation              AM-PAC OT "6 Clicks" Daily Activity     Outcome Measure Help from another person eating meals?: A Lot Help from another person taking care of personal grooming?: A Lot Help from another person toileting, which includes using toliet, bedpan, or urinal?: A Lot Help from another person bathing (including washing, rinsing, drying)?: Total Help from another person to put on and taking off regular upper body clothing?: A  Lot Help from another person to put on and taking off regular lower body clothing?: Total 6 Click Score: 10   End of Session Nurse Communication: Other (comment)(Discussed referral with nurse and with Palliative Care NP)  Activity Tolerance: Patient tolerated treatment well Patient left: in bed;with call bell/phone within reach;with bed alarm set  OT Visit Diagnosis: Feeding difficulties (R63.3)                Time: [id number] OT Time Calculation (min): 28 min Charges:  OT General Charges $OT Visit: 1 Visit OT Evaluation $OT Eval High Complexity: 1 High OT Treatments $Therapeutic Activity: 23-37 mins  Baldomero Lamy, MS, OTR/L 07/08/19, 4:55 PM

## 2019-07-08 NOTE — Progress Notes (Addendum)
Follow up - Critical Care Medicine Note  Patient Details:    Edward Strong is an 86 y.o. male. admitted with acute on chronic renal failure with metabolic acidosis/lactic acidosis and septic shock requiring levophed gtt secondary to infected stage IV sacral decubitus ulcer.   Lines, Airways, Drains: PICC Double Lumen [id number] PICC Right Basilic 41 cm 0 cm (Active)  Indication for Insertion or Continuance of Line Vasoactive infusions 07/07/19 2000  Exposed Catheter (cm) 0 cm 07/05/19 0800  Site Assessment Clean;Dry;Intact 07/05/19 2000  Lumen #1 Status Infusing;Flushed;Blood return noted 07/07/19 1200  Lumen #2 Status Infusing;Flushed;Blood return noted 07/07/19 1200  Dressing Type Transparent 07/07/19 1200  Dressing Status Clean;Dry;Intact;Antimicrobial disc in place 07/07/19 1200  Line Care Connections checked and tightened 07/07/19 1200  Line Adjustment (NICU/IV Team Only) No 07/04/19 2000  Dressing Intervention New dressing 07/04/19 2000  Dressing Change Due 07/11/19 07/06/19 0758     Negative Pressure Wound Therapy Sacrum (Active)  Last dressing change 07/07/19 07/07/19 1000  Site / Wound Assessment Dressing in place / Unable to assess 07/07/19 1000  Peri-wound Assessment Intact 07/06/19 0400  Cycle Continuous 07/07/19 1000  Target Pressure (mmHg) 125 07/07/19 1000  Canister Changed No 07/07/19 1000  Dressing Status Intact 07/07/19 1000  Drainage Amount Scant 07/07/19 1000  Drainage Description Serosanguineous 07/07/19 1000  Output (mL) 0 mL 07/05/19 1615     Urethral Catheter Paige RN  Latex 14 Fr. (Active)  Indication for Insertion or Continuance of Catheter Healing of stage 3 or 4 pressure injuries AND incontinence 07/07/19 2000  Site Assessment Clean;Intact;Dry 07/07/19 2000  Catheter Maintenance Bag below level of bladder;Catheter secured;Drainage bag/tubing not touching floor;Insertion date on drainage bag;No dependent loops;Bag emptied prior to transport 07/07/19 2000   Collection Container Standard drainage bag 07/07/19 2000  Securement Method Securing device (Describe) 07/07/19 2000  Urinary Catheter Interventions (if applicable) Unclamped 0000000 2000  Input (mL) 200 mL 07/04/19 2000  Output (mL) 250 mL 07/07/19 0400    Anti-infectives:  Anti-infectives (From admission, onward)   Start     Dose/Rate Route Frequency Ordered Stop   07/08/19 1200  Ampicillin-Sulbactam (UNASYN) 3 g in sodium chloride 0.9 % 100 mL IVPB     3 g 200 mL/hr over 30 Minutes Intravenous Every 6 hours 07/08/19 1130     07/04/19 2000  vancomycin (VANCOCIN) IVPB 1000 mg/200 mL premix  Status:  Discontinued     1,000 mg 200 mL/hr over 60 Minutes Intravenous Every 36 hours 07/04/19 1051 07/08/19 1130   07/03/19 1800  ceFEPIme (MAXIPIME) 2 g in sodium chloride 0.9 % 100 mL IVPB  Status:  Discontinued     2 g 200 mL/hr over 30 Minutes Intravenous Every 24 hours 07/03/19 0214 07/08/19 1130   07/03/19 0954  vancomycin variable dose per unstable renal function (pharmacist dosing)  Status:  Discontinued      Does not apply See admin instructions 07/03/19 0954 07/04/19 1051   07/03/19 0645  vancomycin (VANCOREADY) IVPB 1250 mg/250 mL     1,250 mg 166.7 mL/hr over 90 Minutes Intravenous  Once 07/03/19 0637 07/03/19 0850   07/02/19 1800  vancomycin (VANCOREADY) IVPB 2000 mg/400 mL     2,000 mg 200 mL/hr over 120 Minutes Intravenous  Once 07/02/19 1706 07/02/19 2058   07/02/19 1715  metroNIDAZOLE (FLAGYL) IVPB 500 mg  Status:  Discontinued     500 mg 100 mL/hr over 60 Minutes Intravenous Every 8 hours 07/02/19 1702 07/08/19 1130   07/02/19 1715  ceFEPIme (  MAXIPIME) 2 g in sodium chloride 0.9 % 100 mL IVPB     2 g 200 mL/hr over 30 Minutes Intravenous  Once 07/02/19 1706 07/02/19 1748      Microbiology: Results for orders placed or performed during the hospital encounter of 07/02/19  Blood culture (routine x 2)     Status: None   Collection Time: 07/02/19  4:26 PM   Specimen:  BLOOD  Result Value Ref Range Status   Specimen Description BLOOD RIGHT ANTECUBITAL  Final   Special Requests   Final    BOTTLES DRAWN AEROBIC AND ANAEROBIC Blood Culture adequate volume   Culture   Final    NO GROWTH 5 DAYS Performed at [facility], [address], [place], [name] [address]    Report Status 07/07/2019 FINAL  Final  Urine culture     Status: None   Collection Time: 07/02/19  5:40 PM   Specimen: Urine, Random  Result Value Ref Range Status   Specimen Description   Final    URINE, RANDOM Performed at [facility], [address], [place], [place] [address]    Special Requests   Final    URINE, RANDOM Performed at [facility], [address], [place], [place] [address]    Culture   Final    NO GROWTH Performed at [facility], [place] [address], [place], [place] [address]    Report Status 07/03/2019 FINAL  Final  Respiratory Panel by RT PCR (Flu A&B, Covid) - Nasopharyngeal Swab     Status: None   Collection Time: 07/02/19  5:48 PM   Specimen: Nasopharyngeal Swab  Result Value Ref Range Status   SARS Coronavirus 2 by RT PCR NEGATIVE NEGATIVE Final    Comment: (NOTE) SARS-CoV-2 target nucleic acids are NOT DETECTED. The SARS-CoV-2 RNA is generally detectable in upper respiratoy specimens during the acute phase of infection. The lowest concentration of SARS-CoV-2 viral copies this assay can detect is 131 copies/mL. A negative result does not preclude SARS-Cov-2 infection and should not be used as the sole basis for treatment or other patient management decisions. A negative result may occur with  improper specimen collection/handling, submission of specimen other than nasopharyngeal swab, presence of viral mutation(s) within the areas targeted by this assay, and inadequate number of viral copies (<131 copies/mL). A negative result must be combined with clinical observations, patient history, and epidemiological information.  The expected result is Negative. Fact Sheet for Patients:  PinkCheek.be Fact Sheet for Healthcare Providers:  GravelBags.it This test is not yet ap proved or cleared by the [place] FDA and  has been authorized for detection and/or diagnosis of SARS-CoV-2 by FDA under an Emergency Use Authorization (EUA). This EUA will remain  in effect (meaning this test can be used) for the duration of the COVID-19 declaration under Section 564(b)(1) of the Act, 21 U.S.C. section 360bbb-3(b)(1), unless the authorization is terminated or revoked sooner.    Influenza A by PCR NEGATIVE NEGATIVE Final   Influenza B by PCR NEGATIVE NEGATIVE Final    Comment: (NOTE) The Xpert Xpress SARS-CoV-2/FLU/RSV assay is intended as an aid in  the diagnosis of influenza from Nasopharyngeal swab specimens and  should not be used as a sole basis for treatment. Nasal washings and  aspirates are unacceptable for Xpert Xpress SARS-CoV-2/FLU/RSV  testing. Fact Sheet for Patients: PinkCheek.be Fact Sheet for Healthcare Providers: GravelBags.it This test is not yet approved or cleared by the [place] and  has been authorized for detection and/or diagnosis of SARS-CoV-2 by  FDA under an Emergency Use Authorization (EUA). This EUA will remain  in effect (meaning this test can be used) for the duration of the  Covid-19 declaration under Section 564(b)(1) of the Act, 21  U.S.C. section 360bbb-3(b)(1), unless the authorization is  terminated or revoked. Performed at [facility]: [address] [place], [place] [address]   Blood culture (routine x 2)     Status: None   Collection Time: 07/02/19  6:16 PM   Specimen: BLOOD  Result Value Ref Range Status   Specimen Description BLOOD LEFT ANTECUBITAL  Final   Special Requests   Final    BOTTLES DRAWN AEROBIC AND ANAEROBIC Blood Culture  adequate volume   Culture   Final    NO GROWTH 5 DAYS Performed at [facility], [address] [place], [place] [address]    Report Status 07/07/2019 FINAL  Final  MRSA PCR Screening     Status: Abnormal   Collection Time: 07/03/19  1:29 AM   Specimen: Nasopharyngeal  Result Value Ref Range Status   MRSA by PCR POSITIVE (A) NEGATIVE Final    Comment:        The GeneXpert MRSA Assay (FDA approved for NASAL specimens only), is one component of a comprehensive MRSA colonization surveillance program. It is not intended to diagnose MRSA infection nor to guide or monitor treatment for MRSA infections. RESULT CALLED TO, READ BACK BY AND VERIFIED WITH: Wendie Simmer RN [phone] 07/03/19 HNM Performed at [facility], [address] [place], [place] [address]   Aerobic/Anaerobic Culture (surgical/deep wound)     Status: None   Collection Time: 07/03/19 12:33 PM   Specimen: PATH Other; Tissue  Result Value Ref Range Status   Specimen Description   Final    ULCER SACRAL ULCER Performed at [facility], [address] [place], [place] [address]    Special Requests   Final    NONE Performed at [facility], [address] [place], [place] [address]    Gram Stain   Final    NO WBC SEEN ABUNDANT GRAM POSITIVE COCCI ABUNDANT GRAM NEGATIVE RODS ABUNDANT GRAM POSITIVE RODS    Culture   Final    MODERATE DIPHTHEROIDS(CORYNEBACTERIUM SPECIES) Standardized susceptibility testing for this organism is not available. FEW BACTEROIDES CACCAE BETA LACTAMASE POSITIVE Performed at [facility], [place] [address], [place],  [address]    Report Status 07/07/2019 FINAL  Final    Best Practice/Protocols:  VTE Prophylaxis: Direct Thrombin Inhibitor   Events:   Studies: DG Sacrum/Coccyx  Result Date: 07/02/2019 CLINICAL DATA:  Deep sacral wound. Concern for osteomyelitis. EXAM: SACRUM AND COCCYX - 2+ VIEW COMPARISON:  None. FINDINGS: On  the lateral view, there is apparent soft tissue ulceration dorsal to the distal sacrum and coccyx. No foreign body or definite bone destruction is seen in this area. Osseous evaluation is limited by demineralization and overlying stool on the AP views. The hip and sacroiliac joint spaces are preserved. There are prominent endplate osteophytes in the lower lumbar spine. IMPRESSION: Soft tissue ulceration dorsal to the distal sacrum and coccyx without radiographic evidence of osteomyelitis. Osseous evaluation is limited by demineralization and overlying stool. Electronically Signed   By: Richardean Sale M.D.   On: 07/02/2019 17:40   US RENAL  Result Date: 06/12/2019 CLINICAL DATA:  Acute renal failure. EXAM: RENAL / URINARY TRACT ULTRASOUND COMPLETE COMPARISON:  None. FINDINGS:  Right Kidney: Renal measurements: 11.6 x 5.3 x 6.4 cm = volume: 199 mL. At least 3 simple cysts are noted, the largest measuring 3.3 cm in lower pole. Echogenicity within normal limits. No mass or hydronephrosis visualized. Left Kidney: Renal measurements: 13.9 x 5.1 x 6.0 cm = volume: 223 mL. Several cysts are noted, the largest measuring 10.5 cm. Echogenicity within normal limits. No hydronephrosis visualized. Bladder: Decompressed secondary to Foley catheter. Other: None. IMPRESSION: Bilateral simple renal cysts are noted, the largest measuring 10.5 cm on the left. No other renal abnormality is noted. Electronically Signed   By: Marijo Conception M.D.   On: 06/12/2019 12:29   DG Chest Port 1 View  Result Date: 07/04/2019 CLINICAL DATA:  Confirm PICC placement EXAM: PORTABLE CHEST 1 VIEW COMPARISON:  07/04/2019, 06/11/2019 FINDINGS: Bilateral interstitial and patchy alveolar airspace opacities. Postsurgical changes in the right lower lung. No focal consolidation. No significant pleural effusion. Small calcified right lower lobe pulmonary nodule likely reflecting sequela prior granulomatous disease. Stable cardiomegaly. No acute osseous  abnormality. IMPRESSION: 1. Right-sided PICC line with the tip projecting over the SVC. Electronically Signed   By: Kathreen Devoid   On: 07/04/2019 20:37   DG Chest Port 1 View  Result Date: 07/04/2019 CLINICAL DATA:  Weakness and shortness of breath. EXAM: PORTABLE CHEST 1 VIEW COMPARISON:  06/11/2019 FINDINGS: The heart is enlarged but appears stable. Stable mediastinal and hilar contours. Persistent right pleural effusion or chronic pleural thickening. Stable surgical scarring changes involving the right lung. Stable appearing bibasilar scarring changes. No definite acute overlying pulmonary findings. IMPRESSION: 1. Stable cardiac enlargement. 2. Stable surgical scarring changes involving the right lung and stable right pleural effusion versus pleural thickening and bibasilar scarring. Electronically Signed   By: Marijo Sanes M.D.   On: 07/04/2019 11:23   DG Chest Port 1 View  Result Date: 06/11/2019 CLINICAL DATA:  Weakness EXAM: PORTABLE CHEST 1 VIEW COMPARISON:  05/12/2019 FINDINGS: Cardiac shadow is enlarged but stable. Aortic calcifications are again seen. Calcified granuloma is again noted in the right mid lung. Postsurgical changes in the right lung are seen as well with basilar scarring. Calcified hilar and mediastinal lymph nodes are seen. No bony abnormality is noted. IMPRESSION: Changes consistent with prior granulomatous disease. No acute bony abnormality is seen. Postsurgical change with chronic scarring in the right base. Electronically Signed   By: Inez Catalina M.D.   On: 06/11/2019 22:46   ECHOCARDIOGRAM COMPLETE  Result Date: 07/04/2019    ECHOCARDIOGRAM REPORT   Patient Name:   Edward Strong Standen Date of Exam: 07/04/2019 Medical Rec #:  [id number]     Height:       71.0 in Accession #:    [id number]    Weight:       196.4 lb Date of Birth:  06/10/1929     BSA:          2.092 m Patient Age:    86 years      BP:           99/65 mmHg Patient Gender: M             HR:           57 bpm. Exam  Location:  [facility] Procedure: 2D Echo, Color Doppler and Cardiac Doppler Indications:     CHF- acute diastolic [id number]  History:         Patient has no prior history of Echocardiogram examinations.  Arrythmias:Atrial Fibrillation; Risk Factors:Hypertension and                  Diabetes. CKD.  Sonographer:     Sherrie Sport RDCS (AE) Referring Phys:  [id number] Ottie Glazier Diagnosing Phys: Ida Rogue MD IMPRESSIONS  1. Left ventricular ejection fraction, by estimation, is 60 to 65%. The left ventricle has normal function. The left ventricle has no regional wall motion abnormalities. Left ventricular diastolic parameters are indeterminate.  2. Right ventricular systolic function is normal. The right ventricular size is normal. There is mild to moderately elevated pulmonary artery systolic pressure.  3. Left atrial size was moderately dilated. FINDINGS  Left Ventricle: Left ventricular ejection fraction, by estimation, is 60 to 65%. The left ventricle has normal function. The left ventricle has no regional wall motion abnormalities. The left ventricular internal cavity size was normal in size. There is  no left ventricular hypertrophy. Left ventricular diastolic parameters are indeterminate. Right Ventricle: The right ventricular size is normal. No increase in right ventricular wall thickness. Right ventricular systolic function is normal. There is moderately elevated pulmonary artery systolic pressure. The tricuspid regurgitant velocity is 3.05 m/s, and with an assumed right atrial pressure of 5 mmHg, the estimated right ventricular systolic pressure is [id number] mmHg. Left Atrium: Left atrial size was moderately dilated. Right Atrium: Right atrial size was normal in size. Pericardium: There is no evidence of pericardial effusion. Mitral Valve: The mitral valve is normal in structure and function. Normal mobility of the mitral valve leaflets. Mild mitral valve regurgitation. No evidence of mitral valve  stenosis. Tricuspid Valve: The tricuspid valve is normal in structure. Tricuspid valve regurgitation is mild . No evidence of tricuspid stenosis. Aortic Valve: The aortic valve is grossly normal. Aortic valve regurgitation is not visualized. No aortic stenosis is present. Aortic valve mean gradient measures 5.0 mmHg. Aortic valve peak gradient measures 9.2 mmHg. Aortic valve area, by VTI measures 2.07 cm. Pulmonic Valve: The pulmonic valve was normal in structure. Pulmonic valve regurgitation is not visualized. No evidence of pulmonic stenosis. Aorta: The aortic root is normal in size and structure. Venous: The inferior vena cava is normal in size with greater than 50% respiratory variability, suggesting right atrial pressure of 3 mmHg. IAS/Shunts: No atrial level shunt detected by color flow Doppler.  LEFT VENTRICLE PLAX 2D LVIDd:         4.99 cm LVIDs:         3.49 cm LV PW:         1.39 cm LV IVS:        0.79 cm LVOT diam:     2.00 cm LV SV:         64 LV SV Index:   31 LVOT Area:     3.14 cm  RIGHT VENTRICLE RV Basal diam:  4.23 cm RV S prime:     16.10 cm/s TAPSE (M-mode): 3.8 cm LEFT ATRIUM              Index       RIGHT ATRIUM           Index LA diam:        6.00 cm  2.87 cm/m  RA Area:     29.10 cm LA Vol (A2C):   110.0 ml 52.57 ml/m RA Volume:   89.70 ml  42.87 ml/m LA Vol (A4C):   133.0 ml 63.56 ml/m LA Biplane Vol: 132.0 ml 63.08 ml/m  AORTIC VALVE  PULMONIC VALVE AV Area (Vmax):    2.07 cm     PV Vmax:        0.82 m/s AV Area (Vmean):   2.04 cm     PV Peak grad:   2.7 mmHg AV Area (VTI):     2.07 cm     RVOT Peak grad: 4 mmHg AV Vmax:           151.50 cm/s AV Vmean:          102.000 cm/s AV VTI:            0.311 m AV Peak Grad:      9.2 mmHg AV Mean Grad:      5.0 mmHg LVOT Vmax:         99.80 cm/s LVOT Vmean:        66.300 cm/s LVOT VTI:          0.205 m LVOT/AV VTI ratio: 0.66  AORTA Ao Root diam: 3.30 cm MITRAL VALVE                TRICUSPID VALVE MV Area (PHT): 2.48 cm      TR Peak grad:   37.2 mmHg MV Decel Time: 306 msec     TR Vmax:        305.00 cm/s MV E velocity: 113.00 cm/s MV A velocity: 113.00 cm/s  SHUNTS MV E/A ratio:  1.00         Systemic VTI:  0.20 m                             Systemic Diam: 2.00 cm Ida Rogue MD Electronically signed by Ida Rogue MD Signature Date/Time: 07/04/2019/6:42:08 PM    Final    DG FEMUR MIN 2 VIEWS LEFT  Result Date: 06/13/2019 CLINICAL DATA:  Left femur ORIF 05/13/2019 EXAM: LEFT FEMUR 2 VIEWS COMPARISON:  None. FINDINGS: Comminuted distal femoral diametaphyseal fracture just above the distal femoral arthroplasty component transfixed with a lateral sideplate and multiple interlocking screws and a single cerclage wire. No hardware failure or complication. Fracture is in near anatomic alignment. Generalized osteopenia. No other fracture or dislocation. Prior total knee arthroplasty in satisfactory position. Postsurgical changes in the surrounding soft tissues. Peripheral vascular atherosclerotic disease. IMPRESSION: Interval distal femoral diametaphyseal ORIF. Electronically Signed   By: Kathreen Devoid   On: 06/13/2019 09:49   [place] EKG SITE RITE  Result Date: 07/04/2019 If [facility] image not attached, placement could not be confirmed due to current cardiac rhythm.   Consults: Treatment Team:  Herbert Pun, MD Yolonda Kida, MD   Subjective:    Overnight Issues: Uneventful night.  Dopamine discontinued, bradycardia unchanged.  Patient remains confused.  Objective:  Vital signs for last 24 hours: Temp:  [97.7 F (36.5 C)-98.2 F (36.8 C)] 97.9 F (36.6 C) (03/02 1734) Pulse Rate:  [39-102] 102 (03/02 1734) Resp:  [11-19] 19 (03/02 1734) BP: (98-148)/(53-89) 119/68 (03/02 1734) SpO2:  [94 %-100 %] 100 % (03/02 1734)  Hemodynamic parameters for last 24 hours:    Intake/Output from previous day: 03/01 0701 - 03/02 0700 In: 360 [P.O.:360] Out: 900 [Urine:900]  Intake/Output this shift: No  intake/output data recorded.  Vent settings for last 24 hours:    Physical Exam:  GENERAL: Elderly gentleman, confused with regards to time, place.  Oriented to person.  Thinks year is 5. HEAD: Normocephalic, atraumatic.  EYES: Pupils equal, round, reactive to light.  No scleral icterus.  MOUTH: Oral mucosa moist. NECK: Supple. No thyromegaly. No nodules. No JVD.  Trachea midline PULMONARY: Coarse breath sounds, no wheezes or rhonchi noted. CARDIOVASCULAR: S1 and S2.  Bradycardic rate rate and regular rhythm.  GASTROINTESTINAL: Soft abdomen, nondistended, no tenderness.  Normoactive bowel sounds. MUSCULOSKELETAL: No joint deformity, no clubbing, no edema.  NEUROLOGIC: No obvious focal deficit, oriented only to self. SKIN: Large stage IV decubitus, sacral, exposed bone, wound VAC placed per surgery PSYCH: Confused, no agitation  Assessment/Plan:   Severe sepsis Septic shock, resolved Due to stage IV sacral decubitus with osteomyelitis Wound VAC in place Continue IV antibiotics-switched to Unasyn according to cultures  Palliative Care consultation-patient is DNR, discussed with team  Continue to recommend transitioning to comfort care Discontinued dopamine Discontinue stress dose steroids Transfer to general medical ward   Bradycardia On dopamine, weaned off  Midodrine was added 2/28, will discontinue due to side effect of bradycardi Not a candidate for pacemaker due to ongoing active infection   Renal Failure-acute on chronic stage II Follow renal panel  continue Foley Catheter-assess need daily Continues with slow improvement   Severe protein calorie malnutrition Age related sarcopenia Nutritionist following Albumin 1.9     LOS: 6 days   Additional comments: Multidisciplinary rounds were performed with the ICU team.  Patient to be transferred to the general medical ward.  Critical Care Total Time*: Level 3 follow-up  C. Derrill Kay, MD [place]  PCCM 07/08/2019  *This note was dictated using voice recognition software/Dragon.  Despite best efforts to proofread, errors can occur which can change the meaning.  Any change was purely unintentional.

## 2019-07-08 NOTE — Progress Notes (Addendum)
Initial Nutrition Assessment  DOCUMENTATION CODES:   Non-severe (moderate) malnutrition in context of acute illness/injury  INTERVENTION:  Will downgrade diet to dysphagia 3 with thin liquids.  Provide Ensure Max Protein po BID between meals, each supplement provides 150 kcal and 30 grams of protein.  Provide Juven Fruit Punch po BID with meals, each serving provides 95 kcal, 2.5 grams protein (collagen), 7 grams L-Arginine, 7 grams L-Glutamine, and micronutrients essential for wound healing.  Provide daily MVI.  Provide vitamin C 500 mg BID for 14 days. Patient has increased needs for vitamin C and RD suspects patient may have vitamin C deficiency. Reassess for need following 14 days.  NUTRITION DIAGNOSIS:   Moderate Malnutrition related to acute illness(recovery from recent left femur fracture on 05/13/2019, stage IV sacral ulcer) as evidenced by 14.6% weight loss over 2 months, mild fat depletion, mild muscle depletion.  GOAL:   Patient will meet greater than or equal to 90% of their needs  MONITOR:   PO intake, Supplement acceptance, Labs, Weight trends, Skin, I & O's  REASON FOR ASSESSMENT:   Consult Assessment of nutrition requirement/status  ASSESSMENT:   86 year old male with PMHx of HTN, DM type 2, A-fib, CKD stage III, hx Hodgkin's lymphoma, recent ORIF of left femur fracture on 05/13/2019 who is admitted from SNF for necrotic stage IV sacral ulcer (wound was first noted during admission 2/3-2/8 for AKI/UTI). Patient is s/p sharp excisional debridement of sacral ulcer down to bone and negative pressure dressing placement.   Met with patient at bedside. He is a poor historian. He reports his appetite is "fine" but is unable to provide any details on usual intake. According to chart his intake is variable (0-100%). His average intake for meals documented is 52%. Noted poor dentition on NFPE. Patient is ordered for "soft" diet but this is a GI soft diet that is low in fiber  and will not help with chewing difficulties. Will downgrade patient to appropriate diet. Patient is unsure when his wound first developed. He has increased nutrient needs for wound healing and would benefit from supplementation.  Patient is unsure of his UBW or weight history. According to chart he was 104.3 kg on 05/13/2019. He is currently 89.1 kg (196.43 lbs). He has lost 15.2 kg (14.6% body weight) over the past 2 months, which is significant for time frame.  Medications reviewed and include: Eliquis, ferrous sulfate 325 mg BID, Novolog 0-20 units Q4hrs, Lantus 15 units QHS, pantoprazole, senna 2 tablets QHS, Unasyn, magnesium sulfate 2 grams IV today.  Labs reviewed: CBG 135-169, Chloride 118, CO2 20, BUN 45, Creatinine 1.73.  Discussed with RN and on rounds.  NUTRITION - FOCUSED PHYSICAL EXAM:    Most Recent Value  Orbital Region  Mild depletion  Upper Arm Region  No depletion  Thoracic and Lumbar Region  No depletion  Buccal Region  Mild depletion  Temple Region  Moderate depletion  Clavicle Bone Region  Mild depletion  Clavicle and Acromion Bone Region  No depletion  Scapular Bone Region  No depletion  Dorsal Hand  No depletion  Patellar Region  No depletion  Anterior Thigh Region  No depletion  Posterior Calf Region  No depletion  Edema (RD Assessment)  Mild  Hair  Reviewed  Eyes  Reviewed  Mouth  Reviewed [poor dentition]  Skin  Reviewed [ecchymosis]  Nails  Reviewed     Diet Order:   Diet Order  DIET SOFT Room service appropriate? Yes; Fluid consistency: Thin  Diet effective now             EDUCATION NEEDS:   No education needs have been identified at this time  Skin:  Skin Assessment: Skin Integrity Issues:(stg IV sacrum (9.7cm x 8.2cm x 3.1cm); ecchymosis)  Last BM:  Unknown  Height:   Ht Readings from Last 1 Encounters:  07/03/19 5' 11"  (1.803 m)   Weight:   Wt Readings from Last 1 Encounters:  07/03/19 89.1 kg   Ideal Body Weight:   78.2 kg  BMI:  Body mass index is 27.4 kg/m.  Estimated Nutritional Needs:   Kcal:  2500-2700  Protein:  125-135 grams  Fluid:  >/= 2 L/day  Jacklynn Barnacle, MS, RD, LDN Pager number available on Amion

## 2019-07-08 NOTE — Progress Notes (Addendum)
Pt HR in the 20's at times on telemetry, occasional pvc's; afib; pt denies chest pain, mild tachypnea 22; SPO2 98% on room air. Dr. Clayborn Bigness made aware. No orders received. Covering hospitalist service Sharion Settler, NP made aware, as per NP if mentation and BP stable, no further treatment indicated at this time.    **Rapid response RN Nikki aware of patient status via face to face conversation

## 2019-07-08 NOTE — Progress Notes (Signed)
Daily Progress Note   Patient Name: Edward Strong       Date: 07/08/2019 DOB: 1929/09/19  Age: 86 y.o. MRN#: [id number] Attending Physician: Tyler Pita, MD Primary Care Physician: Toni Arthurs, NP Admit Date: 07/02/2019  Reason for Consultation/Follow-up: Establishing goals of care  Subjective: Patient is resting in bed. He is confused. He states it is 35, can confirm the president but cannot state the name himself. He knows he is in the hospital but cannot state the name or location of the hospital. He states he lives alone. He has 2 sons. He is widowed. He states he values his independence. He is a man of faith, and states he has been saved since he was 86 years old.   Functionally, he uses a walker. He states his sons help him. He states he would never want to live in a nursing facility, and would not want to live with sons. He is unable to state what his wishes are on care moving forward, just that he wants to be at home.  Spoke with his son [name]. He states his brother lives 4 hours a way. 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 He states his father has been clear about wanting to live in his home.   We discussed his diagnosis, prognosis, GOC, EOL wishes disposition and options. The difference between an aggressive medical intervention path and a comfort care path was discussed.  Values and goals of care important to patient and family were attempted to be elicited.  Discussed limitations of medical interventions to prolong quality of life in some situations and discussed the concept of human mortality.  He states he and his brother are discussing his father's care moving forward. Plans for [place] meeting hopefully tomorrow if his brother can come into town this evening.     Length  of Stay: 6  Current Medications: Scheduled Meds:  . apixaban  2.5 mg Oral BID  . vitamin C  500 mg Oral BID  . aspirin  81 mg Oral Daily  . Chlorhexidine Gluconate Cloth  6 each Topical Daily  . ferrous sulfate  325 mg Oral BID WC  . insulin aspart  0-5 Units Subcutaneous QHS  . insulin aspart  0-9 Units Subcutaneous TID WC  . insulin glargine  10 Units Subcutaneous QHS  . midodrine  5 mg Oral TID WC  . [START ON 07/09/2019] multivitamin with minerals  1 tablet Oral Daily  . mupirocin ointment   Nasal BID  . nutrition supplement (JUVEN)  1 packet Oral BID WC  . pantoprazole  40 mg Oral QHS  . Ensure Max Protein  11 oz Oral BID BM  . senna  2 tablet Oral QHS  . sodium chloride flush  10-40 mL Intracatheter Q12H    Continuous Infusions: . ampicillin-sulbactam (UNASYN) IV 3 g (07/08/19 1300)  . lactated ringers 10 mL/hr at 07/08/19 1041    PRN Meds: albuterol, HYDROcodone-acetaminophen, morphine injection, sodium chloride flush  Physical Exam Constitutional:      General: He is not in acute distress. HENT:     Head: Normocephalic and atraumatic.  Pulmonary:     Effort: Pulmonary effort is normal.  Skin:    General: Skin is warm and dry.  Neurological:     Mental Status: He is alert.     Comments: Confused.              Vital Signs: BP 98/72   Pulse 82   Temp 98.2 F (36.8 C) (Oral)   Resp 19   Ht 5\' 11"  (1.803 m)   Wt 89.1 kg   SpO2 100%   BMI 27.40 kg/m  SpO2: SpO2: 100 % O2 Device: O2 Device: Room Air O2 Flow Rate: O2 Flow Rate (L/min): 2 L/min  Intake/output summary:   Intake/Output Summary (Last 24 hours) at 07/08/2019 1353 Last data filed at 07/08/2019 1000 Gross per 24 hour  Intake 5097.07 ml  Output 1550 ml  Net 3547.07 ml   LBM: Last BM Date: 07/03/19(per previous documentation) Baseline Weight: Weight: 104.3 kg Most recent weight: Weight: 89.1 kg       Palliative Assessment/Data: PPS 40%    Flowsheet Rows     Most Recent Value  Intake Tab   Referral Department  Critical care  Unit at Time of Referral  ICU  Palliative Care Primary Diagnosis  Sepsis/Infectious Disease  Date Notified  07/03/19  Palliative Care Type  New Palliative care  Reason for referral  Clarify Goals of Care  Date of Admission  07/02/19  Date first seen by Palliative Care  07/03/19  # of days Palliative referral response time  0 Day(s)  # of days IP prior to Palliative referral  1  Clinical Assessment  Palliative Performance Scale Score  40%  Psychosocial & Spiritual Assessment  Palliative Care Outcomes  Patient/Family meeting held?  Yes  Who was at the meeting?  son and DIL  Palliative Care Outcomes  Clarified goals of care, Provided psychosocial or spiritual support      Patient Active Problem List   Diagnosis Date Noted  . Sepsis ([place]) 07/03/2019  . Sacral decubitus ulcer, stage IV ([place])   . Goals of care, counseling/discussion   . Palliative care by specialist   . Sacral ulcer ([place]) 07/02/2019  . Acute urinary retention 06/12/2019  . BPH with obstruction/lower urinary tract symptoms 06/12/2019  . UTI (urinary tract infection) 06/12/2019  . Hyperglycemia due to type 2 diabetes mellitus ([place]) 06/11/2019  . Metabolic acidosis 123456  . S/P ORIF (open reduction internal fixation) fracture 06/11/2019  . Hypotension 06/11/2019  . Acute metabolic encephalopathy 123456  . AKI (acute kidney injury) ([place]) 06/11/2019  . Postoperative anemia due to acute blood loss 05/16/2019  . Acute kidney injury superimposed on chronic kidney disease ([place])   . Closed fracture of left distal femur ([place]) 05/12/2019  . Chronic systolic CHF (congestive heart failure) ([place]) 05/12/2019  . Leukocytosis 05/12/2019  . Type II diabetes mellitus with renal manifestations ([place])   . HTN (hypertension)   . CKD (chronic kidney disease), stage IIIa   . Atrial fibrillation [facility])     Palliative Care Assessment & Plan   HPI: 86 y.o. male  with past medical history  of T2DM, CKD 3, a fib on eliquis, CAD, systolic heart failure, Hodgkins lymphoma, and urinary retention admitted on 07/02/2019 with sacral ulcer and concern for osteomyelitis. He was evaluated by wound care on day of admission and found to have grossly necrotic stage 4 wound over his sacrum. He was hospitalized 2/3-2/8 for AKI. Also had femur fracture Jan 2021 that required ORIF. PMT consulted for [place].   Recommendations/Plan: Plans for family meeting tomorrow if  second brother can come.   Code Status:  DNR  Prognosis:   Unable to determine - poor prognosis r/t septic shock, osteo, poor baseline functional status  Discharge Planning:  To Be Determined  Care plan was discussed with RN  Thank you for allowing the Palliative Medicine Team to assist in the care of this patient.   Total Time 35 minutes Prolonged Time Billed  no       Greater than 50%  of this time was spent counseling and coordinating care related to the above assessment and plan.

## 2019-07-09 DIAGNOSIS — I482 Chronic atrial fibrillation, unspecified: Secondary | ICD-10-CM

## 2019-07-09 DIAGNOSIS — E440 Moderate protein-calorie malnutrition: Secondary | ICD-10-CM

## 2019-07-09 DIAGNOSIS — N1831 Chronic kidney disease, stage 3a: Secondary | ICD-10-CM

## 2019-07-09 LAB — RENAL FUNCTION PANEL
Albumin: 1.8 g/dL — ABNORMAL LOW (ref 3.5–5.0)
Anion gap: 6 (ref 5–15)
BUN: 45 mg/dL — ABNORMAL HIGH (ref 8–23)
CO2: 18 mmol/L — ABNORMAL LOW (ref 22–32)
Calcium: 7.6 mg/dL — ABNORMAL LOW (ref 8.9–10.3)
Chloride: 119 mmol/L — ABNORMAL HIGH (ref 98–111)
Creatinine, Ser: 1.77 mg/dL — ABNORMAL HIGH (ref 0.61–1.24)
GFR calc Af Amer: 39 mL/min — ABNORMAL LOW (ref >60)
GFR calc non Af Amer: 34 mL/min — ABNORMAL LOW (ref >60)
Glucose, Bld: 80 mg/dL (ref 70–99)
Phosphorus: 2.7 mg/dL (ref 2.5–4.6)
Potassium: 3.5 mmol/L (ref 3.5–5.1)
Sodium: 143 mmol/L (ref 135–145)

## 2019-07-09 LAB — GLUCOSE, CAPILLARY
Glucose-Capillary: 53 mg/dL — ABNORMAL LOW (ref 70–99)
Glucose-Capillary: 98 mg/dL (ref 70–99)

## 2019-07-09 LAB — MAGNESIUM: Magnesium: 2 mg/dL (ref 1.7–2.4)

## 2019-07-09 LAB — CBC
HCT: 25 % — ABNORMAL LOW (ref 39.0–52.0)
Hemoglobin: 8.1 g/dL — ABNORMAL LOW (ref 13.0–17.0)
MCH: 33.6 pg (ref 26.0–34.0)
MCHC: 32.4 g/dL (ref 30.0–36.0)
MCV: 103.7 fL — ABNORMAL HIGH (ref 80.0–100.0)
Platelets: 177 10*3/uL (ref 150–400)
RBC: 2.41 MIL/uL — ABNORMAL LOW (ref 4.22–5.81)
RDW: 15.2 % (ref 11.5–15.5)
WBC: 11.6 10*3/uL — ABNORMAL HIGH (ref 4.0–10.5)
nRBC: 0.3 % — ABNORMAL HIGH (ref 0.0–0.2)

## 2019-07-09 MED ORDER — GLYCOPYRROLATE 0.2 MG/ML IJ SOLN
0.2000 mg | INTRAMUSCULAR | Status: DC | PRN
  Filled 2019-07-09: qty 1

## 2019-07-09 MED ORDER — ACETAMINOPHEN 650 MG RE SUPP: 650.0000 mg | Freq: Four times a day (QID) | RECTAL | Status: DC | PRN

## 2019-07-09 MED ORDER — ACETAMINOPHEN 325 MG PO TABS: 650.0000 mg | ORAL_TABLET | Freq: Four times a day (QID) | ORAL | Status: DC | PRN

## 2019-07-09 MED ORDER — ONDANSETRON HCL 4 MG/2ML IJ SOLN: 4.0000 mg | Freq: Four times a day (QID) | INTRAMUSCULAR | Status: DC | PRN

## 2019-07-09 MED ORDER — LORAZEPAM 1 MG PO TABS: 1.0000 mg | ORAL_TABLET | ORAL | Status: DC | PRN

## 2019-07-09 MED ORDER — HEPARIN SODIUM (PORCINE) 5000 UNIT/ML IJ SOLN
5000.0000 [IU] | Freq: Three times a day (TID) | INTRAMUSCULAR | Status: DC
  Administered 2019-07-09 – 2019-07-10 (×2): 5000 [IU] via SUBCUTANEOUS
  Filled 2019-07-09 (×2): qty 1

## 2019-07-09 MED ORDER — LORAZEPAM 2 MG/ML PO CONC
1.0000 mg | ORAL | Status: DC | PRN
  Filled 2019-07-09: qty 0.5

## 2019-07-09 MED ORDER — HALOPERIDOL 0.5 MG PO TABS
0.5000 mg | ORAL_TABLET | ORAL | Status: DC | PRN
  Filled 2019-07-09: qty 1

## 2019-07-09 MED ORDER — SODIUM CHLORIDE 0.9% FLUSH: 3.0000 mL | INTRAVENOUS | Status: DC | PRN

## 2019-07-09 MED ORDER — HALOPERIDOL LACTATE 2 MG/ML PO CONC
0.5000 mg | ORAL | Status: DC | PRN
  Filled 2019-07-09: qty 0.3

## 2019-07-09 MED ORDER — INSULIN GLARGINE 100 UNIT/ML ~~LOC~~ SOLN
5.0000 [IU] | Freq: Every day | SUBCUTANEOUS | Status: DC
  Filled 2019-07-09: qty 0.05

## 2019-07-09 MED ORDER — ONDANSETRON 4 MG PO TBDP
4.0000 mg | ORAL_TABLET | Freq: Four times a day (QID) | ORAL | Status: DC | PRN
  Filled 2019-07-09: qty 1

## 2019-07-09 MED ORDER — HALOPERIDOL LACTATE 5 MG/ML IJ SOLN: 0.5000 mg | INTRAMUSCULAR | Status: DC | PRN

## 2019-07-09 MED ORDER — BIOTENE DRY MOUTH MT LIQD: 15.0000 mL | OROMUCOSAL | Status: DC | PRN

## 2019-07-09 MED ORDER — SODIUM CHLORIDE 0.9% FLUSH
3.0000 mL | Freq: Two times a day (BID) | INTRAVENOUS | Status: DC
  Administered 2019-07-09 – 2019-07-11 (×4): 3 mL via INTRAVENOUS

## 2019-07-09 MED ORDER — GLYCOPYRROLATE 1 MG PO TABS
1.0000 mg | ORAL_TABLET | ORAL | Status: DC | PRN
  Filled 2019-07-09: qty 1

## 2019-07-09 MED ORDER — POLYVINYL ALCOHOL 1.4 % OP SOLN
1.0000 [drp] | Freq: Four times a day (QID) | OPHTHALMIC | Status: DC | PRN
  Filled 2019-07-09: qty 15

## 2019-07-09 MED ORDER — LORAZEPAM 2 MG/ML IJ SOLN: 0.5000 mg | INTRAMUSCULAR | Status: DC | PRN

## 2019-07-09 NOTE — Progress Notes (Addendum)
Daily Progress Note   Patient Name: Edward Strong       Date: 07/09/2019 DOB: [birth year]  Age: 86 y.o. MRN#: [id number] Attending Physician: Oren Binet* Primary Care Physician: Toni Arthurs, NP Admit Date: 07/02/2019  Reason for Consultation/Follow-up: Establishing goals of care  Subjective: Patient is resting in bed. He is less confused than yesterday, and is able to state name, that he is at [facility], and the president was Trump and is now [place]. He does state the year is 40 and cannot explain why he is in the hospital.   He complains of sacral pain today. He states he just wants to go home. Sons came for a family meeting.   We discussed his diagnosis, prognosis, GOC, EOL wishes disposition and options. The difference between an aggressive medical intervention path and a comfort care path was discussed. We discussed his sacral wound, sepsis, abx therapy, BP, HR, and his acute on chronic kidney disease, and poor PO intake.  Values and goals of care important to patient and family were attempted to be elicited.  Discussed limitations of medical interventions to prolong quality of life in some situations and discussed the concept of human mortality.  During the meeting, he states his wife has been dead for years and he "is ready to go". He was asked if that meant being ready to stop the treatment to prolong his life and his time on earth, and focus on being comfortable for what time he has. He states he is not interested in continuing antibiotics IV or oral, returning to the hospital in the future, or going to a facility after discharge. He adds "I just want to go home and stay there."  His sons voice understanding of his father's wishes and will discuss viable options for accomodation.  Departed room to provide the family a chance to talk, upon return, patient and family are in agreement to transition to full comfort care. They would like to stop insulin and blood thinners, and all meds except comfort based meds. Continue wound care.     I completed a MOST form today and the signed original was placed in the chart. It was reviewed with patient and both sons. Patient declined signing form and requests his son do it. A photocopy was placed in the chart to be scanned into EMR. The patient outlined their wishes for the following treatment decisions:  Cardiopulmonary Resuscitation: Do Not Attempt Resuscitation (DNR/No CPR)  Medical Interventions: Comfort Measures: Keep clean, warm, and dry. Use medication by any route, positioning, wound care, and other measures to relieve pain and suffering. Use oxygen, suction and manual treatment of airway obstruction as needed for comfort. Do not transfer to the hospital unless comfort needs cannot be met in current location.  Antibiotics: No antibiotics (use other measures to relieve symptoms)  IV Fluids: No IV fluids (provide other measures to ensure comfort)  Feeding Tube: No feeding tube      Length of Stay: 7  Current Medications: Scheduled Meds:  . apixaban  2.5 mg Oral BID  . vitamin C  500 mg Oral BID  . aspirin  81 mg Oral Daily  . Chlorhexidine Gluconate Cloth  6  each Topical Daily  . ferrous sulfate  325 mg Oral BID WC  . insulin aspart  0-5 Units Subcutaneous QHS  . insulin aspart  0-9 Units Subcutaneous TID WC  . insulin glargine  10 Units Subcutaneous QHS  . multivitamin with minerals  1 tablet Oral Daily  . mupirocin ointment   Nasal BID  . nutrition supplement (JUVEN)  1 packet Oral BID WC  . pantoprazole  40 mg Oral QHS  . Ensure Max Protein  11 oz Oral BID BM  . senna  2 tablet Oral QHS  . sodium chloride flush  10-40 mL Intracatheter Q12H    Continuous Infusions: . ampicillin-sulbactam (UNASYN) IV 3 g (07/09/19  6659)  . lactated ringers Stopped (07/08/19 1240)    PRN Meds: albuterol, HYDROcodone-acetaminophen, morphine injection, sodium chloride flush  Physical Exam HENT:     Head: Normocephalic and atraumatic.  Pulmonary:     Effort: Pulmonary effort is normal.  Skin:    General: Skin is warm and dry.  Neurological:     Mental Status: He is alert.     Comments: Improving confusion.              Vital Signs: BP 120/62 (BP Location: Left Arm)   Pulse (!) 57   Temp (!) 97.4 F (36.3 C) (Oral)   Resp 17   Ht 5' 11"  (1.803 m)   Wt 89.1 kg   SpO2 98%   BMI 27.40 kg/m  SpO2: SpO2: 98 % O2 Device: O2 Device: Room Air O2 Flow Rate: O2 Flow Rate (L/min): 2 L/min  Intake/output summary:   Intake/Output Summary (Last 24 hours) at 07/09/2019 1138 Last data filed at 07/09/2019 9357 Gross per 24 hour  Intake 849.03 ml  Output 650 ml  Net 199.03 ml   LBM: Last BM Date: 07/03/19 Baseline Weight: Weight: 104.3 kg Most recent weight: Weight: 89.1 kg       Palliative Assessment/Data: PPS 40%    Flowsheet Rows     Most Recent Value  Intake Tab  Referral Department  Critical care  Unit at Time of Referral  ICU  Palliative Care Primary Diagnosis  Sepsis/Infectious Disease  Date Notified  07/03/19  Palliative Care Type  New Palliative care  Reason for referral  Clarify Goals of Care  Date of Admission  07/02/19  Date first seen by Palliative Care  07/03/19  # of days Palliative referral response time  0 Day(s)  # of days IP prior to Palliative referral  1  Clinical Assessment  Palliative Performance Scale Score  40%  Psychosocial & Spiritual Assessment  Palliative Care Outcomes  Patient/Family meeting held?  Yes  Who was at the meeting?  son and DIL  Palliative Care Outcomes  Clarified goals of care, Provided psychosocial or spiritual support      Patient Active Problem List   Diagnosis Date Noted  . Malnutrition of moderate degree 07/08/2019  . Sepsis ([place]) 07/03/2019  .  Sacral decubitus ulcer, stage IV ([place])   . Goals of care, counseling/discussion   . Palliative care by specialist   . Sacral ulcer ([place]) 07/02/2019  . Acute urinary retention 06/12/2019  . BPH with obstruction/lower urinary tract symptoms 06/12/2019  . UTI (urinary tract infection) 06/12/2019  . Hyperglycemia due to type 2 diabetes mellitus ([place]) 06/11/2019  . Metabolic acidosis 01/77/9390  . S/P ORIF (open reduction internal fixation) fracture 06/11/2019  . Hypotension 06/11/2019  . Acute metabolic encephalopathy 30/01/2329  . AKI (acute kidney injury) (  [place]) 06/11/2019  . Postoperative anemia due to acute blood loss 05/16/2019  . Acute kidney injury superimposed on chronic kidney disease ([place])   . Closed fracture of left distal femur ([place]) 05/12/2019  . Chronic systolic CHF (congestive heart failure) ([place]) 05/12/2019  . Leukocytosis 05/12/2019  . Type II diabetes mellitus with renal manifestations ([place])   . HTN (hypertension)   . CKD (chronic kidney disease), stage IIIa   . Atrial fibrillation [facility])     Palliative Care Assessment & Plan   HPI: 86 y.o. male  with past medical history of T2DM, CKD 3, a fib on eliquis, CAD, systolic heart failure, Hodgkins lymphoma, and urinary retention admitted on 07/02/2019 with sacral ulcer and concern for osteomyelitis. He was evaluated by wound care on day of admission and found to have grossly necrotic stage 4 wound over his sacrum. He was hospitalized 2/3-2/8 for AKI. Also had femur fracture Jan 2021 that required ORIF. PMT consulted for [place].   Recommendations/Plan: Shift to comfort care. MOST form in place.    Code Status:  DNR  Prognosis:  < 2 weeks: poor prognosis r/t septic shock, osteo, poor baseline functional status, poor PO intake with albumin of 2.2 and acute on CKD. Per patient and family, stopping abx, stopping insulin and blood thinners, and any other medication not with a focus on comfort.  Discharge Planning:  To Be  Determined  Care plan was discussed with RN. IM sent to primary MD.  Thank you for allowing the Palliative Medicine Team to assist in the care of this patient.   Total Time  10:30-11:40 12:45-1:25 70 min  35 min  105 min Prolonged Time Billed  yes       Greater than 50%  of this time was spent counseling and coordinating care related to the above assessment and plan.

## 2019-07-09 NOTE — Progress Notes (Signed)
[facility]  Patient description: [name] is a 86 year old male with PMH significant for HTN, CKD stage IIIa, chronic atrial fibrillation (on Eliquis), CAD, chronic systolic heart failure,  type 2 diabetes, Hodgkin's lymphoma and dementia who was admitted for acute on chronic renal failure with lactic acidosis and sepsis secondary to stage IV decubitus sacral ulcer were further complications of hypotension and bradycardia. The patient was transferred from the ICU to the floor on yesterday.  SUBJECTIVE: The patient reports to be doing well on today. He denies any dyspnea, dizziness, syncope or chest pain at this time. The patient reports that he was able to sleep well on last night without any issues.   OBJECTIVE: The patient remains in atrial fibrillation with intermittent bradycardia. He is more Alert and Oriented today than previously. He continues to be asymptomatic. Current HR=69 and patient is on telemetry monitoring. Left hand is noted to be edematous at about +3 pitting edema.    Vitals:   07/08/19 1500 07/08/19 1734 07/09/19 0035 07/09/19 0838  BP: 112/70 119/68 (!) 103/45 120/62  Pulse: (!) 40 (!) 102 69 (!) 57  Resp: 14 19 17 17   Temp:  97.9 F (36.6 C) 97.7 F (36.5 C) (!) 97.4 F (36.3 C)  TempSrc:  Oral Oral Oral  SpO2: 100% 100% 96% 98%  Weight:      Height:         Intake/Output Summary (Last 24 hours) at 07/09/2019 0854 Last data filed at 07/09/2019 0641 Gross per 24 hour  Intake 1029.37 ml  Output 1300 ml  Net -270.63 ml    PHYSICAL EXAM  General: well nourished, in no acute distress HEENT:  Normocephalic and atramatic Neck:  No JVD.  Lungs: Clear bilaterally to auscultation Heart: irregular heart rate and rhythm. S1 and S2 auscultated without gallops or murmurs.  Abdomen: Bowel sounds are positive, abdomen soft and non-tender  Msk:  Diminished strength in all extremities, left leg remains in brace Extremities: diminished strength in all extremities,  +3 pitting edema to right hand, +2 pitting edema in BLE, No clubbing, cyanosis    Neuro: Alert and oriented X 2 Psych:  Good affect, responds appropriately at times, delayed responses   LABS: Basic Metabolic Panel: Recent Labs    07/08/19 0500 07/08/19 0501 07/09/19 0344  NA 143  --  143  K 3.6  --  3.5  CL 118*  --  119*  CO2 20*  --  18*  GLUCOSE 157*  --  80  BUN 45*  --  45*  CREATININE 1.73*  --  1.77*  CALCIUM 7.7*  --  7.6*  MG  --  1.7 2.0  PHOS 2.8  --  2.7   Liver Function Tests: Recent Labs    07/08/19 0500 07/09/19 0344  ALBUMIN 1.9* 1.8*   No results for input(s): LIPASE, AMYLASE in the last 72 hours. CBC: Recent Labs    07/08/19 0501 07/09/19 0344  WBC 11.2* 11.6*  HGB 8.2* 8.1*  HCT 25.6* 25.0*  MCV 104.5* 103.7*  PLT 174 177   Cardiac Enzymes: No results for input(s): CKTOTAL, CKMB, CKMBINDEX, TROPONINI in the last 72 hours. BNP: Invalid input(s): POCBNP D-Dimer: No results for input(s): DDIMER in the last 72 hours. Hemoglobin A1C: No results for input(s): HGBA1C in the last 72 hours. Fasting Lipid Panel: No results for input(s): CHOL, HDL, LDLCALC, TRIG, CHOLHDL, LDLDIRECT in the last 72 hours. Thyroid Function Tests: No results for input(s): TSH, T4TOTAL, T3FREE, THYROIDAB in  the last 72 hours.  Invalid input(s): FREET3 Anemia Panel: No results for input(s): VITAMINB12, FOLATE, FERRITIN, TIBC, IRON, RETICCTPCT in the last 72 hours.  No results found.   Echocardiogram: IMPRESSIONS  1. Left ventricular ejection fraction, by estimation, is 60 to 65%. The  left ventricle has normal function. The left ventricle has no regional  wall motion abnormalities. Left ventricular diastolic parameters are  indeterminate.  2. Right ventricular systolic function is normal. The right ventricular  size is normal. There is mild to moderately elevated pulmonary artery  systolic pressure.  3. Left atrial size was moderately dilated.   TELEMETRY:  atrial fibrillation with intermittent bradycardia    ASSESSMENT AND PLAN:  Principal Problem:   Sepsis ([place]) Active Problems:   Type II diabetes mellitus with renal manifestations (HCC)   CKD (chronic kidney disease), stage IIIa   Atrial fibrillation (HCC)   Chronic systolic CHF (congestive heart failure) (HCC)   AKI (acute kidney injury) ([place])   Sacral ulcer (HCC)   Sacral decubitus ulcer, stage IV (HCC)   Goals of care, counseling/discussion   Palliative care by specialist   Malnutrition of moderate degree   Plan:   - Bradycardia, asymptomatic, patient's lowest HR overnight was 40 bpm, current HR is 56.  -Pacemaker not recommended at this time   -Please continue to hold Midodrine at this time in the setting of bradycardia  - Hypotension, stable, patient is normotensive, dopmaine gtt was d/c'd and the patient was transferred to the floor              - Please hold midodrine at this time in the presence of bradycardia.             -Continue to closely monitor telemetry.  - Atrial Fibrillation, stable, rate controlled.              -Please continue anticoagulation with eliquis and monitor for s/s of bleeding.  - Chronic systolic HF, reasonably stable at this time, most recent EF= 60-65% as confirmed by echocardiogram, patient appears less edematous on today             - Please continue Lasix 40mg  daily as needed for edema or dyspnea.             - Please continue daily weights, strict I&O's and careful administration of fluids.    -Start Low sodium diet   -Recommend elevating LUE   - Acute on chronic kidney disease, reasonably stable at this time, recent BUN=45 and creatinine= 1.77.              -Monitor fluid administration closely             -Avoid any nephrotoxic agents at this time.   -Continue monitoring Kidney function   - Type II diabetes, insulin-dependent, reasonably controlled, current CBG=135.              -Please continue glucose management with sliding  scale insulin per protocol   - Sepsis likely secondary to Stage IV decubitus sacral ulcer s/p irrigation and debridement on 2/25, stable, followed by General surgery and Wound care             -Please continue cefepime, Flagyl, vancomycin per pharmacy's consultation.  -Please continue WOC recommendations with wound vac    Discussed with Dr. Clayborn Bigness who also evaluated the patient and the plan was made in collaboration with him.    [place], APRN, ACNPC-AG  07/09/2019 8:54 AM

## 2019-07-09 NOTE — Progress Notes (Signed)
Pharmacy Electrolyte Monitoring Consult:  Pharmacy consulted to assist in monitoring and replacing electrolytes in this 86 y.o. male admitted on 07/02/2019 with Wound Infection. PMH significant forHTN,CKD stage IIIa,chronic atrial fibrillation(on Eliquis),CAD, chronic systolic heart failure, type 2 diabetes, Hodgkin's lymphoma and dementia who was admitted for acute on chronic renal failure with lactic acidosis and sepsis secondary to stage IV decubitus sacral ulcer were further complications of hypotension and bradycardia.   Labs:  Sodium (mmol/L)  Date Value  07/09/2019 143   Potassium (mmol/L)  Date Value  07/09/2019 3.5   Magnesium (mg/dL)  Date Value  07/09/2019 2.0   Phosphorus (mg/dL)  Date Value  07/09/2019 2.7   Calcium (mg/dL)  Date Value  07/09/2019 7.6 (L)   Albumin (g/dL)  Date Value  07/09/2019 1.8 (L)   Corrected Ca: 9.31 mg/dL  Assessment/Plan: Electrolytes:   Other electrolytes stable, renal function improving  Follow up with morning labs.  Patient has transferred from the ICU to the floor.    Pharmacy will sign off at this time.   Lu Duffel, PharmD, BCPS Clinical Pharmacist 07/09/2019 7:51 AM

## 2019-07-09 NOTE — Progress Notes (Signed)
OT Cancellation Note  Patient Details Name: Edward Strong MRN: [id number] DOB: Nov 30, 1929   Cancelled Treatment:    Reason Eval/Treat Not Completed: Other (comment)   Approached pt in AM to work during breakfast on self feeding and use of adaptive equipment.  Pt had just finished his breakfast.  Will attempt again at lunch/as able during mealtime.    Oren Binet 07/09/2019, 9:23 AM

## 2019-07-09 NOTE — Progress Notes (Signed)
Inpatient Diabetes Program Recommendations  AACE/ADA: New Consensus Statement on Inpatient Glycemic Control   Target Ranges:  Prepandial:   less than 140 mg/dL      Peak postprandial:   less than 180 mg/dL (1-2 hours)      Critically ill patients:  140 - 180 mg/dL   Results for DINNIS, JANOWIAK (MRN [id number]) as of 07/09/2019 12:19  Ref. Range 07/08/2019 07:16 07/08/2019 11:31 07/08/2019 17:27 07/08/2019 21:11 07/09/2019 08:40 07/09/2019 11:46  Glucose-Capillary Latest Ref Range: 70 - 99 mg/dL 135 (H) 169 (H) 215 (H) 186 (H) 53 (L) 98   Review of Glycemic Control  Diabetes history: DM Outpatient Diabetes medications: NPH 30 units QHS, Novolog 10 units TID with meals Current orders for Inpatient glycemic control: Lantus 10 units QHS, Novolog 0-9 units TID with meals, Novolog 0-5 units QHS  Inpatient Diabetes Program Recommendations:   Insulin-Basal: Please consider decreasing Lantus to 5 units QHS.  NOTE: Fasting glucose 53 mg/dl today and noted steroids have been discontinued.  Thanks, Barnie Alderman, RN, MSN, CDE Diabetes Coordinator Inpatient Diabetes Program [phone] (Team Pager from 8am to 5pm)

## 2019-07-09 NOTE — Care Management Important Message (Signed)
Important Message  Patient Details  Name: NORMAL GRESS MRN: [id number] Date of Birth: 1929-07-22   Medicare Important Message Given:  Other (see comment)  They had a family meeting today and MOST form completed.  Patient now on comfort measures. Out of respect for patient and family, no Important Message given.  Juliann Pulse A Nautia Lem 07/09/2019, 3:17 PM

## 2019-07-09 NOTE — Consult Note (Addendum)
[place] Nurse Consult Note: Reason for Consult: Surgical team performed debridement in the OR of a stage 4 pressure injury.  WOC requested to change Vac dressing Q M/W/F. Pressure Injury POA: Yes Measurement: 15 cm x 10 cm and 4 cm to main wound; narrow bridge of skin at 12:00 o'clock separated an opening which is .3X.3cm and communicates underneath the skin level with the central wound. Wound is 85% red, 15% yellow slough, bone palpable, no odor. Lower location at 7:00 o'clock has a separate full thickness wound, approx 5X5X.2cm, 95% red, 5% yellow, moist and separated by a narrow skin bridge from the central wound.   Drainage (amount, consistency, odor) Mod amt brown drainage in the cannister.  Dressing procedure/placement/frequency: Pt is incontinent of mod amt liquid stool and it is difficult to maintain a seal since wound is located in close proximity to the rectum.  Applied 2 barrier rings to wound edges to attempt a seal, then one piece black sponge.  Pt did not require pain meds and tolerated without apparent discomfort.  Bridged track pad to hip to offload pressure.  Cont suction on at 176mm. WOC will plan to change Fri if patient is still in the hospital at that time. Julien Girt MSN, RN, [place], [place], [place]

## 2019-07-09 NOTE — Progress Notes (Signed)
PROGRESS NOTE    Edward Strong  [id number]  DOB: January 13, 1930  DOA: 07/02/2019 PCP: Toni Arthurs, NP Outpatient Specialists:   Hospital course:  Edward Strong a 86 year old male with PMH significant forHTN,CKD stage IIIa,chronic atrial fibrillation(on Eliquis),CAD, chronic systolic heart failure, type 2 diabetes, Hodgkin's lymphoma and dementia who was admitted for acute on chronic renal failure with lactic acidosis and sepsis secondary to stage IV decubitus sacral ulcer were further complications of hypotension and bradycardia requiring ICU care with pressors.  Patient was slowly weaned off his pressors and is now transferred to the floor today for ongoing care.  Patient has been followed closely by palliative care and meeting today with family resulted in patient being on comfort measures alone.   Subjective:  Patient himself has no complaints.  When I asked how he is doing he states "I am doing".  He admits to feeling down and frustrated at not getting better.   Objective: Vitals:   07/08/19 1500 07/08/19 1734 07/09/19 0035 07/09/19 0838  BP: 112/70 119/68 (!) 103/45 120/62  Pulse: (!) 40 (!) 102 69 (!) 57  Resp: 14 19 17 17   Temp:  97.9 F (36.6 C) 97.7 F (36.5 C) (!) 97.4 F (36.3 C)  TempSrc:  Oral Oral Oral  SpO2: 100% 100% 96% 98%  Weight:      Height:        Intake/Output Summary (Last 24 hours) at 07/09/2019 1741 Last data filed at 07/09/2019 [id number] Gross per 24 hour  Intake 600 ml  Output 650 ml  Net -50 ml   Filed Weights   07/02/19 1611 07/03/19 0130  Weight: 104.3 kg 89.1 kg     Assessment & Plan:   86 year old man with stage IV decubitus ulcer is transferred out of the ICU 07/09/2019 after being treated for septic shock.  Patient is presently on comfort measures per palliative care consultation.  Septic shock thought to be secondary to stage IV decubitus ulcer, now resolved Patient is presently normotensive off all pressors Seems to be  adequately fluid resuscitated Has responded well to Unasyn which is being continued Renal function slowly improving Continue present management  Stage IV decubitus ulcer Ongoing treatment per wound care, has wound VAC in place Continue Unasyn  Left upper extremity edema Unclear etiology, possibly secondary to infiltration of IV versus possible DVT Will order left upper extremity DVT study now especially as Eliquis is being held  Bradycardia Cruciate ongoing cardiac consultation Continue to hold midodrine Patient seems asymptomatic from bradycardia standpoint  Atrial fibrillation Eliquis is being held likely secondary to ongoing management of stage IV DVT Patient is already bradycardic, so rapid rate control is not an issue  HFrEF Patient does have lower extremity edema Continue Lasix 40 daily Follow daily weights and I's and O's  CRF Improving slowly with gentle hydration Avoid nephrotoxic agents  Severe protein calorie malnutrition Albumin is 1.9, implications of this have been discussed with family  Goals for care Patient is presently on comfort care measures per note per palliative care   DVT prophylaxis: We will start patient on heparin 5000 subcu every 8 today Code Status: DNR Family Communication: It of care has been in close touch with family Disposition Plan: TBD   Consultants:  Transferred from ICU  Procedures:  Ongoing wound management and wound care with wound VAC  Antimicrobials:  Unasyn   Exam:  General: Sad chronically ill-appearing man lying in bed in no acute distress. Eyes: sclera anicteric, conjuctiva mild  injection bilaterally CVS: Bradycardia, [id number], 2/6 systolic murmur. Respiratory: Decreased breath sounds likely secondary decrease as per Tori effort GI: NABS, soft, NT,  LE: 2+ edema bilaterally in the lower extremities, left upper extremity with 2-3+ edema.   Data Reviewed: Basic Metabolic Panel: Recent Labs  Lab  07/03/19 0510 07/04/19 0531 07/05/19 0418 07/06/19 0515 07/07/19 0547 07/08/19 0500 07/08/19 0501 07/09/19 0344  NA 145   < > 143 140 142 143  --  143  K 4.7   < > 4.6 4.1 3.9 3.6  --  3.5  CL 119*   < > 120* 117* 117* 118*  --  119*  CO2 16*   < > 18* 14* 20* 20*  --  18*  GLUCOSE 174*   < > 237* 153* 153* 157*  --  80  BUN 68*   < > 61* 47* 50* 45*  --  45*  CREATININE 2.67*   < > 2.38* 1.83* 1.94* 1.73*  --  1.77*  CALCIUM 7.9*   < > 7.7* 7.7* 7.7* 7.7*  --  7.6*  MG 2.1  --   --   --   --   --  1.7 2.0  PHOS 4.7*  --   --   --   --  2.8  --  2.7   < > = values in this interval not displayed.   Liver Function Tests: Recent Labs  Lab 07/08/19 0500 07/09/19 0344  ALBUMIN 1.9* 1.8*   No results for input(s): LIPASE, AMYLASE in the last 168 hours. No results for input(s): AMMONIA in the last 168 hours. CBC: Recent Labs  Lab 07/04/19 0531 07/05/19 0418 07/06/19 0515 07/08/19 0501 07/09/19 0344  WBC 11.1* 13.9* 11.0* 11.2* 11.6*  HGB 8.7* 7.9* 7.0* 8.2* 8.1*  HCT 27.8* 24.3* 22.5* 25.6* 25.0*  MCV 106.5* 104.7* 106.6* 104.5* 103.7*  PLT 290 229 182 174 177   Cardiac Enzymes: No results for input(s): CKTOTAL, CKMB, CKMBINDEX, TROPONINI in the last 168 hours. BNP (last 3 results) No results for input(s): PROBNP in the last 8760 hours. CBG: Recent Labs  Lab 07/08/19 1131 07/08/19 1727 07/08/19 2111 07/09/19 0840 07/09/19 1146  GLUCAP 169* 215* 186* 53* 98    Recent Results (from the past 240 hour(s))  Blood culture (routine x 2)     Status: None   Collection Time: 07/02/19  4:26 PM   Specimen: BLOOD  Result Value Ref Range Status   Specimen Description BLOOD RIGHT ANTECUBITAL  Final   Special Requests   Final    BOTTLES DRAWN AEROBIC AND ANAEROBIC Blood Culture adequate volume   Culture   Final    NO GROWTH 5 DAYS Performed at [facility], [address] [place], [place] [address]    Report Status 07/07/2019 FINAL  Final  Urine culture      Status: None   Collection Time: 07/02/19  5:40 PM   Specimen: Urine, Random  Result Value Ref Range Status   Specimen Description   Final    URINE, RANDOM Performed at [facility], [address] [place], [place] [address]    Special Requests   Final    URINE, RANDOM Performed at [facility], [address], [place], [place] [address]    Culture   Final    NO GROWTH Performed at [facility], [place] [address] [place], [place] [address]    Report Status 07/03/2019 FINAL  Final  Respiratory Panel by RT PCR (  Flu A&B, Covid) - Nasopharyngeal Swab     Status: None   Collection Time: 07/02/19  5:48 PM   Specimen: Nasopharyngeal Swab  Result Value Ref Range Status   SARS Coronavirus 2 by RT PCR NEGATIVE NEGATIVE Final    Comment: (NOTE) SARS-CoV-2 target nucleic acids are NOT DETECTED. The SARS-CoV-2 RNA is generally detectable in upper respiratoy specimens during the acute phase of infection. The lowest concentration of SARS-CoV-2 viral copies this assay can detect is 131 copies/mL. A negative result does not preclude SARS-Cov-2 infection and should not be used as the sole basis for treatment or other patient management decisions. A negative result may occur with  improper specimen collection/handling, submission of specimen other than nasopharyngeal swab, presence of viral mutation(s) within the areas targeted by this assay, and inadequate number of viral copies (<131 copies/mL). A negative result must be combined with clinical observations, patient history, and epidemiological information. The expected result is Negative. Fact Sheet for Patients:  PinkCheek.be Fact Sheet for Healthcare Providers:  GravelBags.it This test is not yet ap proved or cleared by the [place] FDA and  has been authorized for detection and/or diagnosis of SARS-CoV-2 by FDA under an Emergency Use Authorization (EUA).  This EUA will remain  in effect (meaning this test can be used) for the duration of the COVID-19 declaration under Section 564(b)(1) of the Act, 21 U.S.C. section 360bbb-3(b)(1), unless the authorization is terminated or revoked sooner.    Influenza A by PCR NEGATIVE NEGATIVE Final   Influenza B by PCR NEGATIVE NEGATIVE Final    Comment: (NOTE) The Xpert Xpress SARS-CoV-2/FLU/RSV assay is intended as an aid in  the diagnosis of influenza from Nasopharyngeal swab specimens and  should not be used as a sole basis for treatment. Nasal washings and  aspirates are unacceptable for Xpert Xpress SARS-CoV-2/FLU/RSV  testing. Fact Sheet for Patients: PinkCheek.be Fact Sheet for Healthcare Providers: GravelBags.it This test is not yet approved or cleared by the [place] FDA and  has been authorized for detection and/or diagnosis of SARS-CoV-2 by  FDA under an Emergency Use Authorization (EUA). This EUA will remain  in effect (meaning this test can be used) for the duration of the  Covid-19 declaration under Section 564(b)(1) of the Act, 21  U.S.C. section 360bbb-3(b)(1), unless the authorization is  terminated or revoked. Performed at [facility], [address] [place], [place] [address]   Blood culture (routine x 2)     Status: None   Collection Time: 07/02/19  6:16 PM   Specimen: BLOOD  Result Value Ref Range Status   Specimen Description BLOOD LEFT ANTECUBITAL  Final   Special Requests   Final    BOTTLES DRAWN AEROBIC AND ANAEROBIC Blood Culture adequate volume   Culture   Final    NO GROWTH 5 DAYS Performed at [facility], [address] [place], [place] [address]    Report Status 07/07/2019 FINAL  Final  MRSA PCR Screening     Status: Abnormal   Collection Time: 07/03/19  1:29 AM   Specimen: Nasopharyngeal  Result Value Ref Range Status   MRSA by PCR POSITIVE (A) NEGATIVE Final    Comment:         The GeneXpert MRSA Assay (FDA approved for NASAL specimens only), is one component of a comprehensive MRSA colonization surveillance program. It is not intended to diagnose MRSA infection nor to guide or monitor treatment for MRSA infections. RESULT CALLED TO, READ BACK BY AND VERIFIED  WITH: Wendie Simmer RN [id number] 07/03/19 HNM Performed at [facility], [address], [place], [place] [address]   Aerobic/Anaerobic Culture (surgical/deep wound)     Status: None   Collection Time: 07/03/19 12:33 PM   Specimen: PATH Other; Tissue  Result Value Ref Range Status   Specimen Description   Final    ULCER SACRAL ULCER Performed at [facility], [address], [place], [place] [address]    Special Requests   Final    NONE Performed at [facility], [address] [place], [place] [address]    Gram Stain   Final    NO WBC SEEN ABUNDANT GRAM POSITIVE COCCI ABUNDANT GRAM NEGATIVE RODS ABUNDANT GRAM POSITIVE RODS    Culture   Final    MODERATE DIPHTHEROIDS(CORYNEBACTERIUM SPECIES) Standardized susceptibility testing for this organism is not available. FEW BACTEROIDES CACCAE BETA LACTAMASE POSITIVE Performed at [facility], [place] [address], [place], [place] [address]    Report Status 07/07/2019 FINAL  Final      Studies: No results found.   Scheduled Meds: . Chlorhexidine Gluconate Cloth  6 each Topical Daily  . mupirocin ointment   Nasal BID  . pantoprazole  40 mg Oral QHS  . Ensure Max Protein  11 oz Oral BID BM  . senna  2 tablet Oral QHS  . sodium chloride flush  10-40 mL Intracatheter Q12H  . sodium chloride flush  3 mL Intravenous Q12H   Continuous Infusions:  Principal Problem:   Sepsis ([place]) Active Problems:   Type II diabetes mellitus with renal manifestations (HCC)   CKD (chronic kidney disease), stage IIIa   Atrial fibrillation (HCC)   Chronic systolic CHF (congestive heart failure) (HCC)   AKI (acute kidney  injury) ([place])   Sacral ulcer (HCC)   Sacral decubitus ulcer, stage IV (HCC)   Goals of care, counseling/discussion   Palliative care by specialist   Malnutrition of moderate degree     Dewaine Oats Derek Jack, MD, FACP, [facility]. [facility]  If 7PM-7AM, please contact night-coverage [email or web address] Password [email or web address] 07/09/2019, 5:41 PM    LOS: 7 days

## 2019-07-09 NOTE — Progress Notes (Signed)
Occupational Therapy Treatment Patient Details Name: Edward Strong MRN: [id number] DOB: 1930-04-16 Today's Date: 07/09/2019    History of present illness Edward Strong is a 86 year old male with PMH significant for HTN, CKD, chronic A Fib, CAD, chronic systolic heart failure,  type 2 diabetes, Hodgkin's lymphoma and dementia who presents today with concerns for worsening stage IV sacral ulcer. The patient was recently hospitalized at [facility] from 2/3-2/8 for AKI secondary to urinary retention and UTI and it was at that time that he was noted to have a sacral pressure ulcer.  On 07/02/2019 the patient was seen as an OP at the wound care clinic and was noted to have grossly necrotic stage IV wound over the sacrum and he was sent to the ED for further evaluation. Admitted to ICU for sepsis with acute renal failure without septic shock and bradycardia. OT consulted for self-feeding/AE.   OT comments  Completed extensive education and training this date with pt, pt's sons, and nursing staff on positioning during meals times and use of adaptive equipment to increase overall level of independence and decrease caregiver burden.  Continued to require MAX A to self feeding during lunch this date using built up utensils.  Son's and staff demonstrated and verbalized understanding.  Discussed needs and progress with Palliative Care NP.  Updating recommendations based on performance this date to no OT follow up.    Follow Up Recommendations  No OT follow up    Equipment Recommendations       Recommendations for Other Services      Precautions / Restrictions Precautions Precautions: Fall;Other (comment) Precaution Comments: MRSA Knee Immobilizer - Left: On at all times Restrictions Weight Bearing Restrictions: No Other Position/Activity Restrictions: No WB restrictions noted in orders.  HOwever, NWB 3 weeks ago with L knee immobilizer.       Mobility Bed Mobility                  Transfers                       Balance                                           ADL either performed or assessed with clinical judgement   ADL Overall ADL's : Needs assistance/impaired Eating/Feeding: Moderate assistance;Maximal assistance;With adaptive utensils;Cueing for safety;Cueing for sequencing;Bed level Eating/Feeding Details (indicate cue type and reason): Poor eye/hand coordination noted and grasp ability                                         Vision Patient Visual Report: No change from baseline     Perception     Praxis      Cognition Arousal/Alertness: Awake/alert Behavior During Therapy: WFL for tasks assessed/performed;Agitated                                   General Comments: Patient agitated after family meeting.        Exercises Other Exercises Other Exercises: Provided education to staff/nursing on positioning patient upright in bed with pillows for B UEs during mealtime Other Exercises: Provided education to pt's sons on positioning during meal time Other  Exercises: Provided education to sons and pt on use of built up utensils during meal time and use of sticky pad to place plate on   Shoulder Instructions       General Comments      Pertinent Vitals/ Pain       Pain Location: Patient unable to verbalize or rate pain.  Home Living                                          Prior Functioning/Environment              Frequency  Min 1X/week        Progress Toward Goals  OT Goals(current goals can now be found in the care plan section)  Progress towards OT goals: OT to reassess next treatment     Plan Discharge plan needs to be updated    Co-evaluation                 AM-PAC OT "6 Clicks" Daily Activity     Outcome Measure   Help from another person eating meals?: A Lot Help from another person taking care of personal grooming?: A Lot Help from another  person toileting, which includes using toliet, bedpan, or urinal?: A Lot Help from another person bathing (including washing, rinsing, drying)?: Total Help from another person to put on and taking off regular upper body clothing?: A Lot Help from another person to put on and taking off regular lower body clothing?: Total 6 Click Score: 10    End of Session    OT Visit Diagnosis: Feeding difficulties (R63.3)   Activity Tolerance Patient tolerated treatment well   Patient Left with family/visitor present   Nurse Communication Other (comment)(Extensive discussing with Palliative NP and nursing staff on appropriate steps for patient and proper postioning during meal time)        Time: [id number] OT Time Calculation (min): 21 min  Charges: OT General Charges $OT Visit: 1 Visit OT Treatments $Self Care/Home Management : 8-22 mins  Baldomero Lamy, MS, OTR/L 07/09/19, 1:56 PM

## 2019-07-09 NOTE — Progress Notes (Signed)
OT Cancellation Note  Patient Details Name: Edward Strong MRN: [id number] DOB: 09-23-1929   Cancelled Treatment:    Reason Eval/Treat Not Completed: Other (comment)   Due to updated status of patient to comfort care and after extensive discussion with family and Palliative Care NP, OT POC ending at this time.  Will follow up as needed.  Signing off at this time.  Thank you.  Oren Binet 07/09/2019, 1:58 PM

## 2019-07-10 DIAGNOSIS — J984 Other disorders of lung: Secondary | ICD-10-CM

## 2019-07-10 LAB — BASIC METABOLIC PANEL
Anion gap: 6 (ref 5–15)
BUN: 47 mg/dL — ABNORMAL HIGH (ref 8–23)
CO2: 20 mmol/L — ABNORMAL LOW (ref 22–32)
Calcium: 7.5 mg/dL — ABNORMAL LOW (ref 8.9–10.3)
Chloride: 116 mmol/L — ABNORMAL HIGH (ref 98–111)
Creatinine, Ser: 1.73 mg/dL — ABNORMAL HIGH (ref 0.61–1.24)
GFR calc Af Amer: 40 mL/min — ABNORMAL LOW (ref >60)
GFR calc non Af Amer: 35 mL/min — ABNORMAL LOW (ref >60)
Glucose, Bld: 98 mg/dL (ref 70–99)
Potassium: 3.7 mmol/L (ref 3.5–5.1)
Sodium: 142 mmol/L (ref 135–145)

## 2019-07-10 NOTE — Consult Note (Addendum)
Wound care follow-up: Vac was leaking earlier, according to bedside nurse, related to frequent loose incontinent stools.  It will not be able to maintain a seal if patient continues to stool, since the stage 4 sacrum pressure injury is in close proximity to the rectum.  Pt now with comfort care goals and Vac is considered "aggressive therapy."  Order has been placed to discontinue Vac.  No further role for [place]. Topical treatment orders placed for bedside nurses to perform daily as follows: Apply moist gauze packing to sacrum wound Q day, then cover with ABD pad and tape. Please re-consult if further assistance is needed.  Thank-you,  Julien Girt MSN, [place], [place], [place], [place]

## 2019-07-10 NOTE — TOC Progression Note (Signed)
Transition of Care [facility]) - Progression Note    Patient Details  Name: AB LEAMING MRN: [id number] Date of Birth: March 24, 1930  Transition of Care [facility]) CM/SW Contact  Jiyah Torpey, [place] Rhyme, LCSW Phone Number: 07/10/2019, 1:05 PM  Clinical Narrative:   Met with Oumar-son to confirm plan. He and brother agree on Dad going to the hospice home. Both do want referral to [facility] and aware will meet with Cory Munch to meet with them sometime today.          Expected Discharge Plan and Services                                                 Social Determinants of Health (SDOH) Interventions    Readmission Risk Interventions Readmission Risk Prevention Plan 06/16/2019  Transportation Screening Complete  PCP or Specialist Appt within 3-5 Days (No Data)  [place] or [place] (No Data)  Palliative Care Screening Not Applicable  Medication Review (RN Care Manager) Complete  Some recent data might be hidden

## 2019-07-10 NOTE — Progress Notes (Signed)
PROGRESS NOTE    Edward Strong  [id number]  DOB: 12/03/1929  DOA: 07/02/2019 PCP: Toni Arthurs, NP Outpatient Specialists:   Hospital course:  [name] a 86 year old male with PMH significant forHTN,CKD stage IIIa,chronic atrial fibrillation(on Eliquis),CAD, chronic systolic heart failure, type 2 diabetes, Hodgkin's lymphoma and dementia who was admitted for acute on chronic renal failure with lactic acidosis and sepsis secondary to stage IV decubitus sacral ulcer were further complications of hypotension and bradycardia requiring ICU care with pressors.  Patient was slowly weaned off his pressors and is now transferred to the floor today for ongoing care.  Patient has been followed closely by palliative care and meeting today with family resulted in patient being on comfort measures alone.   Subjective:  Patient himself seems quite down. He shrugs when I ask him how he is doing. He states he would like to go home, that is his main wish.   Objective: Vitals:   07/08/19 1734 07/09/19 0035 07/09/19 0838 07/10/19 0743  BP: 119/68 (!) 103/45 120/62 129/82  Pulse: (!) 102 69 (!) 57 64  Resp: 19 17 17 18   Temp: 97.9 F (36.6 C) 97.7 F (36.5 C) (!) 97.4 F (36.3 C) 97.6 F (36.4 C)  TempSrc: Oral Oral Oral Oral  SpO2: 100% 96% 98% 97%  Weight:      Height:        Intake/Output Summary (Last 24 hours) at 07/10/2019 1116 Last data filed at 07/10/2019 0956 Gross per 24 hour  Intake 60 ml  Output 1150 ml  Net -1090 ml   Filed Weights   07/02/19 1611 07/03/19 0130  Weight: 104.3 kg 89.1 kg     Assessment & Plan:   86 year old man with stage IV decubitus ulcer is transferred out of the ICU 07/09/2019 after being treated for septic shock.  Patient is presently on comfort measures per palliative care consultation.  Palliative Care Patient is being actively followed by palliative care service He was made comfort care 3/3/21in discussion with his family All  fluids, labs and active treatment have been stopped,  Palliative meds have been continued.   Severe protein calorie malnutrition Albumin is 1.9, implications of this have been discussed with family, therefore patient is comfort care  Stage IV decubitus ulcer Wet to dry per wound care since now on comfort care Unasyn discontinued per palliative care   Left upper extremity edema No further w/u warranted since comfort care  Bradycardia Asymptomatic  Atrial fibrillation Eliquis discontinued   HFrEF Lasix discontinued   S/P Septic shock thought to be secondary to stage IV decubitus ulcer, now resolved Patient is presently normotensive off all pressors Had responded well to Unasyn, now discontinued    DVT prophylaxis: comfort care only  Code Status: DNR Family Communication: palliative care in close contact with family Disposition Plan: TBD   Consultants:  Transferred from ICU  Procedures:  Ongoing wound management and wound care with wound VAC  Antimicrobials:  Unasyn   Exam:  General: Sad chronically ill-appearing man lying in bed in no acute distress. Eyes: sclera anicteric, conjuctiva mild injection bilaterally CVS: Bradycardia, [id number], 2/6 systolic murmur. Respiratory: Decreased breath sounds likely secondary decrease as per Tori effort GI: NABS, soft, NT,  LE: 2+ edema bilaterally in the lower extremities, left upper extremity with 2 + edema and some erythema in lower arm where an iv appears to have infiltrated.    Data Reviewed: Basic Metabolic Panel: Recent Labs  Lab 07/06/19 0515 07/07/19 0547  07/08/19 0500 07/08/19 0501 07/09/19 0344 07/10/19 0621  NA 140 142 143  --  143 142  K 4.1 3.9 3.6  --  3.5 3.7  CL 117* 117* 118*  --  119* 116*  CO2 14* 20* 20*  --  18* 20*  GLUCOSE 153* 153* 157*  --  80 98  BUN 47* 50* 45*  --  45* 47*  CREATININE 1.83* 1.94* 1.73*  --  1.77* 1.73*  CALCIUM 7.7* 7.7* 7.7*  --  7.6* 7.5*  MG  --   --   --  1.7 2.0   --   PHOS  --   --  2.8  --  2.7  --    Liver Function Tests: Recent Labs  Lab 07/08/19 0500 07/09/19 0344  ALBUMIN 1.9* 1.8*   No results for input(s): LIPASE, AMYLASE in the last 168 hours. No results for input(s): AMMONIA in the last 168 hours. CBC: Recent Labs  Lab 07/04/19 0531 07/05/19 0418 07/06/19 0515 07/08/19 0501 07/09/19 0344  WBC 11.1* 13.9* 11.0* 11.2* 11.6*  HGB 8.7* 7.9* 7.0* 8.2* 8.1*  HCT 27.8* 24.3* 22.5* 25.6* 25.0*  MCV 106.5* 104.7* 106.6* 104.5* 103.7*  PLT 290 229 182 174 177   Cardiac Enzymes: No results for input(s): CKTOTAL, CKMB, CKMBINDEX, TROPONINI in the last 168 hours. BNP (last 3 results) No results for input(s): PROBNP in the last 8760 hours. CBG: Recent Labs  Lab 07/08/19 1131 07/08/19 1727 07/08/19 2111 07/09/19 0840 07/09/19 1146  GLUCAP 169* 215* 186* 53* 98    Recent Results (from the past 240 hour(s))  Blood culture (routine x 2)     Status: None   Collection Time: 07/02/19  4:26 PM   Specimen: BLOOD  Result Value Ref Range Status   Specimen Description BLOOD RIGHT ANTECUBITAL  Final   Special Requests   Final    BOTTLES DRAWN AEROBIC AND ANAEROBIC Blood Culture adequate volume   Culture   Final    NO GROWTH 5 DAYS Performed at [facility], [address], [place], [place] [address]    Report Status 07/07/2019 FINAL  Final  Urine culture     Status: None   Collection Time: 07/02/19  5:40 PM   Specimen: Urine, Random  Result Value Ref Range Status   Specimen Description   Final    URINE, RANDOM Performed at [facility], [address], [place], [place] [address]    Special Requests   Final    URINE, RANDOM Performed at [facility], [address] [place], [place] [address]    Culture   Final    NO GROWTH Performed at [facility], [place] [address] [place], [place] [address]    Report Status 07/03/2019 FINAL  Final  Respiratory Panel by RT PCR (Flu A&B, Covid) -  Nasopharyngeal Swab     Status: None   Collection Time: 07/02/19  5:48 PM   Specimen: Nasopharyngeal Swab  Result Value Ref Range Status   SARS Coronavirus 2 by RT PCR NEGATIVE NEGATIVE Final    Comment: (NOTE) SARS-CoV-2 target nucleic acids are NOT DETECTED. The SARS-CoV-2 RNA is generally detectable in upper respiratoy specimens during the acute phase of infection. The lowest concentration of SARS-CoV-2 viral copies this assay can detect is 131 copies/mL. A negative result does not preclude SARS-Cov-2 infection and should not be used as the sole basis for treatment or other patient management decisions. A negative result may occur with  improper  specimen collection/handling, submission of specimen other than nasopharyngeal swab, presence of viral mutation(s) within the areas targeted by this assay, and inadequate number of viral copies (<131 copies/mL). A negative result must be combined with clinical observations, patient history, and epidemiological information. The expected result is Negative. Fact Sheet for Patients:  PinkCheek.be Fact Sheet for Healthcare Providers:  GravelBags.it This test is not yet ap proved or cleared by the [place] FDA and  has been authorized for detection and/or diagnosis of SARS-CoV-2 by FDA under an Emergency Use Authorization (EUA). This EUA will remain  in effect (meaning this test can be used) for the duration of the COVID-19 declaration under Section 564(b)(1) of the Act, 21 U.S.C. section 360bbb-3(b)(1), unless the authorization is terminated or revoked sooner.    Influenza A by PCR NEGATIVE NEGATIVE Final   Influenza B by PCR NEGATIVE NEGATIVE Final    Comment: (NOTE) The Xpert Xpress SARS-CoV-2/FLU/RSV assay is intended as an aid in  the diagnosis of influenza from Nasopharyngeal swab specimens and  should not be used as a sole basis for treatment. Nasal washings and  aspirates  are unacceptable for Xpert Xpress SARS-CoV-2/FLU/RSV  testing. Fact Sheet for Patients: PinkCheek.be Fact Sheet for Healthcare Providers: GravelBags.it This test is not yet approved or cleared by the [place] FDA and  has been authorized for detection and/or diagnosis of SARS-CoV-2 by  FDA under an Emergency Use Authorization (EUA). This EUA will remain  in effect (meaning this test can be used) for the duration of the  Covid-19 declaration under Section 564(b)(1) of the Act, 21  U.S.C. section 360bbb-3(b)(1), unless the authorization is  terminated or revoked. Performed at [facility], [address] [place], [place] [address]   Blood culture (routine x 2)     Status: None   Collection Time: 07/02/19  6:16 PM   Specimen: BLOOD  Result Value Ref Range Status   Specimen Description BLOOD LEFT ANTECUBITAL  Final   Special Requests   Final    BOTTLES DRAWN AEROBIC AND ANAEROBIC Blood Culture adequate volume   Culture   Final    NO GROWTH 5 DAYS Performed at [facility], [address] [place], [place] [address]    Report Status 07/07/2019 FINAL  Final  MRSA PCR Screening     Status: Abnormal   Collection Time: 07/03/19  1:29 AM   Specimen: Nasopharyngeal  Result Value Ref Range Status   MRSA by PCR POSITIVE (A) NEGATIVE Final    Comment:        The GeneXpert MRSA Assay (FDA approved for NASAL specimens only), is one component of a comprehensive MRSA colonization surveillance program. It is not intended to diagnose MRSA infection nor to guide or monitor treatment for MRSA infections. RESULT CALLED TO, READ BACK BY AND VERIFIED WITH: Wendie Simmer RN [phone] 07/03/19 HNM Performed at [facility], [address] [place], [place] [address]   Aerobic/Anaerobic Culture (surgical/deep wound)     Status: None   Collection Time: 07/03/19 12:33 PM   Specimen: PATH Other; Tissue  Result  Value Ref Range Status   Specimen Description   Final    ULCER SACRAL ULCER Performed at [facility], [address], [place], [place] [address]    Special Requests   Final    NONE Performed at [facility], [address] [place], [place] [address]    Gram Stain   Final    NO WBC SEEN ABUNDANT GRAM POSITIVE  COCCI ABUNDANT GRAM NEGATIVE RODS ABUNDANT GRAM POSITIVE RODS    Culture   Final    MODERATE DIPHTHEROIDS(CORYNEBACTERIUM SPECIES) Standardized susceptibility testing for this organism is not available. FEW BACTEROIDES CACCAE BETA LACTAMASE POSITIVE Performed at [facility], [place] [address], [place], [place] [address]    Report Status 07/07/2019 FINAL  Final      Studies: No results found.   Scheduled Meds: . Chlorhexidine Gluconate Cloth  6 each Topical Daily  . mupirocin ointment   Nasal BID  . pantoprazole  40 mg Oral QHS  . Ensure Max Protein  11 oz Oral BID BM  . senna  2 tablet Oral QHS  . sodium chloride flush  10-40 mL Intracatheter Q12H  . sodium chloride flush  3 mL Intravenous Q12H   Continuous Infusions:  Principal Problem:   Sepsis ([place]) Active Problems:   Type II diabetes mellitus with renal manifestations (HCC)   CKD (chronic kidney disease), stage IIIa   Atrial fibrillation (HCC)   Chronic systolic CHF (congestive heart failure) (HCC)   AKI (acute kidney injury) ([place])   Sacral ulcer (HCC)   Sacral decubitus ulcer, stage IV (HCC)   Goals of care, counseling/discussion   Palliative care by specialist   Malnutrition of moderate degree     Dewaine Oats Derek Jack, MD, FACP, [facility]. [facility]  If 7PM-7AM, please contact night-coverage [email or web address] Password [email or web address] 07/10/2019, 11:16 AM    LOS: 8 days

## 2019-07-10 NOTE — Progress Notes (Addendum)
Daily Progress Note   Patient Name: Edward Strong       Date: 07/10/2019 DOB: November 05, 1929  Age: 86 y.o. MRN#: [id number] Attending Physician: Oren Binet* Primary Care Physician: Toni Arthurs, NP Admit Date: 07/02/2019  Reason for Consultation/Follow-up: Establishing goals of care  Subjective: Patient is resting in bed. Spoke with son [name] on the phone and he states his brother will be able to answer all questions and discuss plan.  Spoke with sibling Cleland at bedside. They would like to focus only on their father's comfort and son states they would like hospice facility placement for his father. TOC team aware.    Per nursing, difficulty with keeping wound vac seal, and wound vac has previously been removed. Plans to stay with dressing changes.   Length of Stay: 8  Current Medications: Scheduled Meds:  . Chlorhexidine Gluconate Cloth  6 each Topical Daily  . mupirocin ointment   Nasal BID  . pantoprazole  40 mg Oral QHS  . Ensure Max Protein  11 oz Oral BID BM  . senna  2 tablet Oral QHS  . sodium chloride flush  10-40 mL Intracatheter Q12H  . sodium chloride flush  3 mL Intravenous Q12H    Continuous Infusions:   PRN Meds: acetaminophen **OR** acetaminophen, albuterol, antiseptic oral rinse, glycopyrrolate **OR** glycopyrrolate **OR** glycopyrrolate, haloperidol **OR** haloperidol **OR** haloperidol lactate, HYDROcodone-acetaminophen, LORazepam **OR** LORazepam **OR** LORazepam, morphine injection, ondansetron **OR** ondansetron (ZOFRAN) IV, polyvinyl alcohol, sodium chloride flush, sodium chloride flush  Physical Exam HENT:     Head: Normocephalic and atraumatic.  Pulmonary:     Effort: Pulmonary effort is normal.  Skin:    General: Skin is warm and dry.  Neurological:     Mental Status: He is alert.     Comments: Only answers question of if he has pain, and states no.              Vital Signs: BP 129/82 (BP Location: Left Arm)   Pulse 64   Temp 97.6 F (36.4 C) (Oral)   Resp 18   Ht 5\' 11"  (1.803 m)   Wt 89.1 kg   SpO2 97%   BMI 27.40 kg/m  SpO2: SpO2: 97 % O2 Device: O2 Device: Room Air O2 Flow Rate: O2 Flow Rate (L/min): 2 L/min  Intake/output summary:   Intake/Output Summary (Last 24 hours) at 07/10/2019 1324 Last data filed at 07/10/2019 [id number] Gross per 24 hour  Intake 60 ml  Output 1150 ml  Net -1090 ml   LBM: Last BM Date: 07/10/19 Baseline Weight: Weight: 104.3 kg Most recent weight: Weight: 89.1 kg       Palliative Assessment/Data: PPS 40%    Flowsheet Rows     Most Recent Value  Intake Tab  Referral Department  Critical care  Unit at Time of Referral  ICU  Palliative Care Primary Diagnosis  Sepsis/Infectious Disease  Date Notified  07/03/19  Palliative Care Type  New Palliative care  Reason for referral  Clarify Goals of Care  Date of Admission  07/02/19  Date first seen by Palliative Care  07/03/19  # of days Palliative referral response time  0 Day(s)  # of days IP  prior to Palliative referral  1  Clinical Assessment  Palliative Performance Scale Score  40%  Psychosocial & Spiritual Assessment  Palliative Care Outcomes  Patient/Family meeting held?  Yes  Who was at the meeting?  son and DIL  Palliative Care Outcomes  Clarified goals of care, Provided psychosocial or spiritual support      Patient Active Problem List   Diagnosis Date Noted  . Malnutrition of moderate degree 07/08/2019  . Sepsis ([place]) 07/03/2019  . Sacral decubitus ulcer, stage IV ([place])   . Goals of care, counseling/discussion   . Palliative care by specialist   . Sacral ulcer ([place]) 07/02/2019  . Acute urinary retention 06/12/2019  . BPH with obstruction/lower urinary tract symptoms 06/12/2019  . UTI (urinary tract infection) 06/12/2019  .  Hyperglycemia due to type 2 diabetes mellitus ([place]) 06/11/2019  . Metabolic acidosis 123456  . S/P ORIF (open reduction internal fixation) fracture 06/11/2019  . Hypotension 06/11/2019  . Acute metabolic encephalopathy 123456  . AKI (acute kidney injury) ([place]) 06/11/2019  . Postoperative anemia due to acute blood loss 05/16/2019  . Acute kidney injury superimposed on chronic kidney disease ([place])   . Closed fracture of left distal femur ([place]) 05/12/2019  . Chronic systolic CHF (congestive heart failure) ([place]) 05/12/2019  . Leukocytosis 05/12/2019  . Type II diabetes mellitus with renal manifestations ([place])   . HTN (hypertension)   . CKD (chronic kidney disease), stage IIIa   . Atrial fibrillation [facility])     Palliative Care Assessment & Plan   HPI: 86 y.o. male  with past medical history of T2DM, CKD 3, a fib on eliquis, CAD, systolic heart failure, Hodgkins lymphoma, and urinary retention admitted on 07/02/2019 with sacral ulcer and concern for osteomyelitis. He was evaluated by wound care on day of admission and found to have grossly necrotic stage 4 wound over his sacrum. He was hospitalized 2/3-2/8 for AKI. Also had femur fracture Jan 2021 that required ORIF. PMT consulted for [place].   Recommendations/Plan: Shift to comfort care. MOST form in place.    Code Status:  DNR  Prognosis:  < 2 weeks: poor prognosis r/t septic shock, osteo, poor baseline functional status, poor PO intake with albumin of 2.2 and acute on CKD. Per patient and family, stopping abx, stopping insulin and blood thinners, and any other medication not with a focus on comfort.  Discharge Planning:  To Be Determined  Care plan was discussed with RN. IM sent to primary MD.  Thank you for allowing the Palliative Medicine Team to assist in the care of this patient.   Total Time  10:00-10:50 50 min Prolonged Time Billed  no      Greater than 50%  of this time was spent counseling and coordinating care  related to the above assessment and plan.

## 2019-07-10 NOTE — Progress Notes (Signed)
[facility] Cardiology  Patient description: [name] is a 86 year old male with PMH significant for HTN, CKD stage IIIa, chronic atrial fibrillation (on Eliquis), CAD, chronic systolic heart failure,  type 2 diabetes, Hodgkin's lymphoma and dementia who was admitted for acute on chronic renal failure with lactic acidosis and sepsis secondary to stage IV decubitus sacral ulcer were further complications of hypotension and bradycardia. The patient was transferred from the ICU to the floor on yesterday.  SUBJECTIVE: The patient reports to be doing "pretty good" on today. He reports that he was able to see his son on yesterday and that he slept well on last night. The patient denies any dizziness, chest pain, dyspnea or lightheadedness at this time.    OBJECTIVE: The patient appears to be feeling much better on today and he is alert and oriented to person, place, month and some of the current situation regarding his hospitalization and health. He was able to any many questions appropriately in comparison to previous examinations. He remains in atrial fibrillation with intermittent bradycardia and continues to be asymptomatic. Left hand edema has decreased to about +2 pitting edema. Generalized edema has significantly decreased as well. On yesterday, the patient and family decided to change the the patient's code status to DNR and the patient is now on [place].   We will sign off on patient at this point. Please re-consult if needed.    Vitals:   07/08/19 1734 07/09/19 0035 07/09/19 0838 07/10/19 0743  BP: 119/68 (!) 103/45 120/62 129/82  Pulse: (!) 102 69 (!) 57 64  Resp: 19 17 17 18   Temp: 97.9 F (36.6 C) 97.7 F (36.5 C) (!) 97.4 F (36.3 C) 97.6 F (36.4 C)  TempSrc: Oral Oral Oral Oral  SpO2: 100% 96% 98% 97%  Weight:      Height:         Intake/Output Summary (Last 24 hours) at 07/10/2019 1025 Last data filed at 07/10/2019 0956 Gross per 24 hour  Intake 60 ml  Output 1150 ml  Net  -1090 ml    PHYSICAL EXAM  General: well nourished, in no acute distress HEENT:  Normocephalic and atramatic Neck:  No JVD.  Lungs: Clear bilaterally to auscultation Heart: irregular heart rate and rhythm. S1 and S2 auscultated without gallops or murmurs.  Abdomen: Bowel sounds are positive, abdomen soft and non-tender  Msk:  Diminished strength in all extremities, left leg remains in brace Extremities: diminished strength in all extremities, +2 pitting edema to right hand, +1 pitting edema in BLE, No clubbing, cyanosis    Neuro: Alert and oriented to person, place and month Psych:  Good affect, responds appropriately most of the time, delayed responses   LABS: Basic Metabolic Panel: Recent Labs    07/08/19 0500 07/08/19 0500 07/08/19 0501 07/09/19 0344 07/10/19 0621  NA 143   < >  --  143 142  K 3.6   < >  --  3.5 3.7  CL 118*   < >  --  119* 116*  CO2 20*   < >  --  18* 20*  GLUCOSE 157*   < >  --  80 98  BUN 45*   < >  --  45* 47*  CREATININE 1.73*   < >  --  1.77* 1.73*  CALCIUM 7.7*   < >  --  7.6* 7.5*  MG  --   --  1.7 2.0  --   PHOS 2.8  --   --  2.7  --    < > = values in this interval not displayed.   Liver Function Tests: Recent Labs    07/08/19 0500 07/09/19 0344  ALBUMIN 1.9* 1.8*   No results for input(s): LIPASE, AMYLASE in the last 72 hours. CBC: Recent Labs    07/08/19 0501 07/09/19 0344  WBC 11.2* 11.6*  HGB 8.2* 8.1*  HCT 25.6* 25.0*  MCV 104.5* 103.7*  PLT 174 177   Cardiac Enzymes: No results for input(s): CKTOTAL, CKMB, CKMBINDEX, TROPONINI in the last 72 hours. BNP: Invalid input(s): POCBNP D-Dimer: No results for input(s): DDIMER in the last 72 hours. Hemoglobin A1C: No results for input(s): HGBA1C in the last 72 hours. Fasting Lipid Panel: No results for input(s): CHOL, HDL, LDLCALC, TRIG, CHOLHDL, LDLDIRECT in the last 72 hours. Thyroid Function Tests: No results for input(s): TSH, T4TOTAL, T3FREE, THYROIDAB in the last 72  hours.  Invalid input(s): FREET3 Anemia Panel: No results for input(s): VITAMINB12, FOLATE, FERRITIN, TIBC, IRON, RETICCTPCT in the last 72 hours.  No results found.   Echocardiogram: IMPRESSIONS  1. Left ventricular ejection fraction, by estimation, is 60 to 65%. The  left ventricle has normal function. The left ventricle has no regional  wall motion abnormalities. Left ventricular diastolic parameters are  indeterminate.  2. Right ventricular systolic function is normal. The right ventricular  size is normal. There is mild to moderately elevated pulmonary artery  systolic pressure.  3. Left atrial size was moderately dilated.   TELEMETRY: atrial fibrillation with intermittent bradycardia    ASSESSMENT AND PLAN:  Principal Problem:   Sepsis ([place]) Active Problems:   Type II diabetes mellitus with renal manifestations (HCC)   CKD (chronic kidney disease), stage IIIa   Atrial fibrillation (HCC)   Chronic systolic CHF (congestive heart failure) (HCC)   AKI (acute kidney injury) ([place])   Sacral ulcer (HCC)   Sacral decubitus ulcer, stage IV (HCC)   Goals of care, counseling/discussion   Palliative care by specialist   Malnutrition of moderate degree   Plan:   - Bradycardia, asymptomatic, reasonably stable at this time, current HR is 64.  -Pacemaker not recommended at this time. Patient is now CC.    -Please continue to hold Midodrine at this time in the setting of bradycardia  - Hypotension, stable, patient is normotensive             - Please hold midodrine at this time in the presence of bradycardia.             -Continue to closely monitor telemetry.  - Atrial Fibrillation, stable, rate controlled.              -Please continue anticoagulation with eliquis and monitor for s/s of bleeding.  - Chronic systolic HF, reasonably stable at this time, most recent EF= 60-65% as confirmed by echocardiogram, patient appears less edematous on today             - Please  continue Lasix 40mg  daily as needed for edema or dyspnea.             - Please continue daily weights, strict I&O's and careful administration of fluids.    -Start Low sodium diet   -Recommend elevating LUE   - Acute on chronic kidney disease, reasonably stable at this time, recent BUN=45 and creatinine= 1.73.              -Monitor fluid administration closely             -  Avoid any nephrotoxic agents at this time.   -Continue monitoring Kidney function   - Type II diabetes, insulin-dependent, reasonably controlled, current CBG=135.              -Please continue glucose management with sliding scale insulin per protocol   - Sepsis likely secondary to Stage IV decubitus sacral ulcer s/p irrigation and debridement on 2/25, stable, followed by General surgery and Wound care             -Please continue cefepime, Flagyl, vancomycin per pharmacy's consultation.  -Please continue WOC recommendations with wound vac    Discussed with Dr. Clayborn Bigness who also evaluated the patient and the plan was made in collaboration with him.    [place], [id number]-AG  07/10/2019 10:25 AM

## 2019-07-10 NOTE — Progress Notes (Addendum)
New referral for [facility] hospice home received from [place]. Patient information given to referral.  Writer met in the room with patient's son Shandy to initiate education regarding hospice services, philosophy, team approach to care and current visitation policy with understanding voiced. A bed will be available for transfer tomorrow. Hospital care team and family aware. Will continue to follow through discharge. Flo Shanks [facility] [employer] [phone]

## 2019-07-11 NOTE — Discharge Summary (Signed)
Edward Strong:[id number] DOB: December 19, 1929 DOA: 07/02/2019  PCP: Toni Arthurs, NP  Admit date: 07/02/2019  Discharge date: 07/11/2019  Admitted From: [facility]   disposition: Hospice home  Home Health: N/A Equipment/Devices: N/A Consultations: Critical care, palliative care Discharge Condition: Stable CODE STATUS: DNR Diet Recommendation: Regular, dysphagia 3  Diet Order            Diet general        DIET DYS 3 Room service appropriate? Yes with Assist; Fluid consistency: Thin  Diet effective now               Chief Complaint  Patient presents with  . Wound Infection     Brief history of present illness from the day of admission and additional interim summary    Edward Strong is a 86 y.o. male with medical history significant for insulin-dependent type 2 diabetes, CKD 3A, chronic A. fib on Eliquis, CAD, systolic heart failure, documented history of Hodgkin's lymphoma ,  recent femur fracture status post ORIF on 05/13/2019, postoperative course complicated by postoperative bleeding requiring 1 unit PRBC who also has a history severe urinary retention secondary to BPH who has been self-catheterizing four times a day until his recent discharge to rehab, who was admitted to [facility] on 06/11/2019 for severe sepsis and altered mental status.  Because of sepsis was thought to be his stage IV sacral decubitus with osteomyelitis.  He was treated in the ICU with pressors, fluid resuscitation and broad-spectrum antibiotics.  During treatment ICU palliative care was consulted and it was noted that patient had been living independently until January when he had his femur fracture.  Since then he has had he has had progressive decline in physical and mental health to the point where he is now intermittently confused and has  developed a stage IV decubitus ulcer with osteomyelitis as noted above.  Patient was made DNR as family continue to have conversations about patient's prognosis and goals for care.                                                                 Hospital Course   Critical care provided in the ICU resulted in resolution of sepsis.  Patient was transferred to the floor where discussions regarding goals for care with palliative care consultation team continued with family.  Patient was noted to be confused throughout much of his stay.  Discussion with patient's sons resulted in patient being transitioned to comfort care with referral to hospice.  Although patient would like to go home, his physical needs preclude him from being taken care of by his family.  He is now being discharged to hospice home and comfort care.  Patient's insulin, aspirin, biotics, antihypertensives have all been discontinued and patient is continued on comfort  medications only.   Discharge diagnosis     Principal Problem:   Sepsis ([place]) Active Problems:   Type II diabetes mellitus with renal manifestations (HCC)   CKD (chronic kidney disease), stage IIIa   Atrial fibrillation (HCC)   Chronic systolic CHF (congestive heart failure) (HCC)   AKI (acute kidney injury) ([place])   Sacral ulcer ([place])   Sacral decubitus ulcer, stage IV ([place])   Goals of care, counseling/discussion   Palliative care by specialist   Malnutrition of moderate degree    Discharge instructions    Discharge Instructions    Diet general   Complete by: As directed       Discharge Medications   Allergies as of 07/11/2019      Reactions   Bee Venom Other (See Comments)   Ezetimibe Other (See Comments)   Fenofibrate Micronized Other (See Comments)   Pravastatin Other (See Comments)   Sulfa Antibiotics Other (See Comments)      Medication List    STOP taking these medications   ascorbic acid 500 MG tablet Commonly known as: VITAMIN C     aspirin 81 MG chewable tablet   Eliquis 2.5 MG Tabs tablet Generic drug: apixaban   ferrous sulfate 325 (65 FE) MG tablet   furosemide 40 MG tablet Commonly known as: LASIX   insulin NPH Human 100 UNIT/ML injection Commonly known as: NOVOLIN N   insulin regular 100 units/mL injection Commonly known as: NOVOLIN R   therapeutic multivitamin-minerals tablet   zinc sulfate 220 (50 Zn) MG capsule     TAKE these medications   bisacodyl 10 MG suppository Commonly known as: DULCOLAX Place 1 suppository (10 mg total) rectally daily as needed for moderate constipation.   Ensure Max Protein Liqd Take 330 mLs (11 oz total) by mouth 2 (two) times daily between meals.   polyethylene glycol 17 g packet Commonly known as: MIRALAX / GLYCOLAX Take 17 g by mouth daily.         Major procedures and Radiology Reports - PLEASE review detailed and final reports thoroughly  -      DG Sacrum/Coccyx  Result Date: 07/02/2019 CLINICAL DATA:  Deep sacral wound. Concern for osteomyelitis. EXAM: SACRUM AND COCCYX - 2+ VIEW COMPARISON:  None. FINDINGS: On the lateral view, there is apparent soft tissue ulceration dorsal to the distal sacrum and coccyx. No foreign body or definite bone destruction is seen in this area. Osseous evaluation is limited by demineralization and overlying stool on the AP views. The hip and sacroiliac joint spaces are preserved. There are prominent endplate osteophytes in the lower lumbar spine. IMPRESSION: Soft tissue ulceration dorsal to the distal sacrum and coccyx without radiographic evidence of osteomyelitis. Osseous evaluation is limited by demineralization and overlying stool. Electronically Signed   By: Richardean Sale M.D.   On: 07/02/2019 17:40   US RENAL  Result Date: 06/12/2019 CLINICAL DATA:  Acute renal failure. EXAM: RENAL / URINARY TRACT ULTRASOUND COMPLETE COMPARISON:  None. FINDINGS: Right Kidney: Renal measurements: 11.6 x 5.3 x 6.4 cm = volume: 199 mL. At  least 3 simple cysts are noted, the largest measuring 3.3 cm in lower pole. Echogenicity within normal limits. No mass or hydronephrosis visualized. Left Kidney: Renal measurements: 13.9 x 5.1 x 6.0 cm = volume: 223 mL. Several cysts are noted, the largest measuring 10.5 cm. Echogenicity within normal limits. No hydronephrosis visualized. Bladder: Decompressed secondary to Foley catheter. Other: None. IMPRESSION: Bilateral simple renal cysts are noted, the largest measuring 10.5  cm on the left. No other renal abnormality is noted. Electronically Signed   By: Marijo Conception M.D.   On: 06/12/2019 12:29   DG Chest Port 1 View  Result Date: 07/04/2019 CLINICAL DATA:  Confirm PICC placement EXAM: PORTABLE CHEST 1 VIEW COMPARISON:  07/04/2019, 06/11/2019 FINDINGS: Bilateral interstitial and patchy alveolar airspace opacities. Postsurgical changes in the right lower lung. No focal consolidation. No significant pleural effusion. Small calcified right lower lobe pulmonary nodule likely reflecting sequela prior granulomatous disease. Stable cardiomegaly. No acute osseous abnormality. IMPRESSION: 1. Right-sided PICC line with the tip projecting over the SVC. Electronically Signed   By: Kathreen Devoid   On: 07/04/2019 20:37   DG Chest Port 1 View  Result Date: 07/04/2019 CLINICAL DATA:  Weakness and shortness of breath. EXAM: PORTABLE CHEST 1 VIEW COMPARISON:  06/11/2019 FINDINGS: The heart is enlarged but appears stable. Stable mediastinal and hilar contours. Persistent right pleural effusion or chronic pleural thickening. Stable surgical scarring changes involving the right lung. Stable appearing bibasilar scarring changes. No definite acute overlying pulmonary findings. IMPRESSION: 1. Stable cardiac enlargement. 2. Stable surgical scarring changes involving the right lung and stable right pleural effusion versus pleural thickening and bibasilar scarring. Electronically Signed   By: Marijo Sanes M.D.   On: 07/04/2019  11:23   DG Chest Port 1 View  Result Date: 06/11/2019 CLINICAL DATA:  Weakness EXAM: PORTABLE CHEST 1 VIEW COMPARISON:  05/12/2019 FINDINGS: Cardiac shadow is enlarged but stable. Aortic calcifications are again seen. Calcified granuloma is again noted in the right mid lung. Postsurgical changes in the right lung are seen as well with basilar scarring. Calcified hilar and mediastinal lymph nodes are seen. No bony abnormality is noted. IMPRESSION: Changes consistent with prior granulomatous disease. No acute bony abnormality is seen. Postsurgical change with chronic scarring in the right base. Electronically Signed   By: Inez Catalina M.D.   On: 06/11/2019 22:46   ECHOCARDIOGRAM COMPLETE  Result Date: 07/04/2019    ECHOCARDIOGRAM REPORT   Patient Name:   NORRIN SHREFFLER Whack Date of Exam: 07/04/2019 Medical Rec #:  [id number]     Height:       71.0 in Accession #:    [id number]    Weight:       196.4 lb Date of Birth:  1930-04-15     BSA:          2.092 m Patient Age:    29 years      BP:           99/65 mmHg Patient Gender: M             HR:           57 bpm. Exam Location:  [facility] Procedure: 2D Echo, Color Doppler and Cardiac Doppler Indications:     CHF- acute diastolic 938.18  History:         Patient has no prior history of Echocardiogram examinations.                  Arrythmias:Atrial Fibrillation; Risk Factors:Hypertension and                  Diabetes. CKD.  Sonographer:     Sherrie Sport RDCS (AE) Referring Phys:  [id number] Ottie Glazier Diagnosing Phys: Ida Rogue MD IMPRESSIONS  1. Left ventricular ejection fraction, by estimation, is 60 to 65%. The left ventricle has normal function. The left ventricle has no regional wall motion abnormalities. Left ventricular diastolic parameters  are indeterminate.  2. Right ventricular systolic function is normal. The right ventricular size is normal. There is mild to moderately elevated pulmonary artery systolic pressure.  3. Left atrial size was moderately dilated.  FINDINGS  Left Ventricle: Left ventricular ejection fraction, by estimation, is 60 to 65%. The left ventricle has normal function. The left ventricle has no regional wall motion abnormalities. The left ventricular internal cavity size was normal in size. There is  no left ventricular hypertrophy. Left ventricular diastolic parameters are indeterminate. Right Ventricle: The right ventricular size is normal. No increase in right ventricular wall thickness. Right ventricular systolic function is normal. There is moderately elevated pulmonary artery systolic pressure. The tricuspid regurgitant velocity is 3.05 m/s, and with an assumed right atrial pressure of 5 mmHg, the estimated right ventricular systolic pressure is 18.8 mmHg. Left Atrium: Left atrial size was moderately dilated. Right Atrium: Right atrial size was normal in size. Pericardium: There is no evidence of pericardial effusion. Mitral Valve: The mitral valve is normal in structure and function. Normal mobility of the mitral valve leaflets. Mild mitral valve regurgitation. No evidence of mitral valve stenosis. Tricuspid Valve: The tricuspid valve is normal in structure. Tricuspid valve regurgitation is mild . No evidence of tricuspid stenosis. Aortic Valve: The aortic valve is grossly normal. Aortic valve regurgitation is not visualized. No aortic stenosis is present. Aortic valve mean gradient measures 5.0 mmHg. Aortic valve peak gradient measures 9.2 mmHg. Aortic valve area, by VTI measures 2.07 cm. Pulmonic Valve: The pulmonic valve was normal in structure. Pulmonic valve regurgitation is not visualized. No evidence of pulmonic stenosis. Aorta: The aortic root is normal in size and structure. Venous: The inferior vena cava is normal in size with greater than 50% respiratory variability, suggesting right atrial pressure of 3 mmHg. IAS/Shunts: No atrial level shunt detected by color flow Doppler.  LEFT VENTRICLE PLAX 2D LVIDd:         4.99 cm LVIDs:          3.49 cm LV PW:         1.39 cm LV IVS:        0.79 cm LVOT diam:     2.00 cm LV SV:         64 LV SV Index:   31 LVOT Area:     3.14 cm  RIGHT VENTRICLE RV Basal diam:  4.23 cm RV S prime:     16.10 cm/s TAPSE (M-mode): 3.8 cm LEFT ATRIUM              Index       RIGHT ATRIUM           Index LA diam:        6.00 cm  2.87 cm/m  RA Area:     29.10 cm LA Vol (A2C):   110.0 ml 52.57 ml/m RA Volume:   89.70 ml  42.87 ml/m LA Vol (A4C):   133.0 ml 63.56 ml/m LA Biplane Vol: 132.0 ml 63.08 ml/m  AORTIC VALVE                    PULMONIC VALVE AV Area (Vmax):    2.07 cm     PV Vmax:        0.82 m/s AV Area (Vmean):   2.04 cm     PV Peak grad:   2.7 mmHg AV Area (VTI):     2.07 cm     RVOT Peak grad: 4 mmHg AV  Vmax:           151.50 cm/s AV Vmean:          102.000 cm/s AV VTI:            0.311 m AV Peak Grad:      9.2 mmHg AV Mean Grad:      5.0 mmHg LVOT Vmax:         99.80 cm/s LVOT Vmean:        66.300 cm/s LVOT VTI:          0.205 m LVOT/AV VTI ratio: 0.66  AORTA Ao Root diam: 3.30 cm MITRAL VALVE                TRICUSPID VALVE MV Area (PHT): 2.48 cm     TR Peak grad:   37.2 mmHg MV Decel Time: 306 msec     TR Vmax:        305.00 cm/s MV E velocity: 113.00 cm/s MV A velocity: 113.00 cm/s  SHUNTS MV E/A ratio:  1.00         Systemic VTI:  0.20 m                             Systemic Diam: 2.00 cm Ida Rogue MD Electronically signed by Ida Rogue MD Signature Date/Time: 07/04/2019/6:42:08 PM    Final    DG FEMUR MIN 2 VIEWS LEFT  Result Date: 06/13/2019 CLINICAL DATA:  Left femur ORIF 05/13/2019 EXAM: LEFT FEMUR 2 VIEWS COMPARISON:  None. FINDINGS: Comminuted distal femoral diametaphyseal fracture just above the distal femoral arthroplasty component transfixed with a lateral sideplate and multiple interlocking screws and a single cerclage wire. No hardware failure or complication. Fracture is in near anatomic alignment. Generalized osteopenia. No other fracture or dislocation. Prior total knee  arthroplasty in satisfactory position. Postsurgical changes in the surrounding soft tissues. Peripheral vascular atherosclerotic disease. IMPRESSION: Interval distal femoral diametaphyseal ORIF. Electronically Signed   By: Kathreen Devoid   On: 06/13/2019 09:49   [place] EKG SITE RITE  Result Date: 07/04/2019 If [facility] image not attached, placement could not be confirmed due to current cardiac rhythm.   Micro Results    Recent Results (from the past 240 hour(s))  Blood culture (routine x 2)     Status: None   Collection Time: 07/02/19  4:26 PM   Specimen: BLOOD  Result Value Ref Range Status   Specimen Description BLOOD RIGHT ANTECUBITAL  Final   Special Requests   Final    BOTTLES DRAWN AEROBIC AND ANAEROBIC Blood Culture adequate volume   Culture   Final    NO GROWTH 5 DAYS Performed at [facility], [address], [place], [name] [address]    Report Status 07/07/2019 FINAL  Final  Urine culture     Status: None   Collection Time: 07/02/19  5:40 PM   Specimen: Urine, Random  Result Value Ref Range Status   Specimen Description   Final    URINE, RANDOM Performed at [facility], [address] [place], [place] [address]    Special Requests   Final    URINE, RANDOM Performed at [facility], [address], [place], [place] [address]    Culture   Final    NO GROWTH Performed at [facility], [place] [address], [place], [place] [address]    Report Status 07/03/2019 FINAL  Final  Respiratory Panel by  RT PCR (Flu A&B, Covid) - Nasopharyngeal Swab     Status: None   Collection Time: 07/02/19  5:48 PM   Specimen: Nasopharyngeal Swab  Result Value Ref Range Status   SARS Coronavirus 2 by RT PCR NEGATIVE NEGATIVE Final    Comment: (NOTE) SARS-CoV-2 target nucleic acids are NOT DETECTED. The SARS-CoV-2 RNA is generally detectable in upper respiratoy specimens during the acute phase of infection. The lowest concentration of SARS-CoV-2 viral  copies this assay can detect is 131 copies/mL. A negative result does not preclude SARS-Cov-2 infection and should not be used as the sole basis for treatment or other patient management decisions. A negative result may occur with  improper specimen collection/handling, submission of specimen other than nasopharyngeal swab, presence of viral mutation(s) within the areas targeted by this assay, and inadequate number of viral copies (<131 copies/mL). A negative result must be combined with clinical observations, patient history, and epidemiological information. The expected result is Negative. Fact Sheet for Patients:  PinkCheek.be Fact Sheet for Healthcare Providers:  GravelBags.it This test is not yet ap proved or cleared by the [place] FDA and  has been authorized for detection and/or diagnosis of SARS-CoV-2 by FDA under an Emergency Use Authorization (EUA). This EUA will remain  in effect (meaning this test can be used) for the duration of the COVID-19 declaration under Section 564(b)(1) of the Act, 21 U.S.C. section 360bbb-3(b)(1), unless the authorization is terminated or revoked sooner.    Influenza A by PCR NEGATIVE NEGATIVE Final   Influenza B by PCR NEGATIVE NEGATIVE Final    Comment: (NOTE) The Xpert Xpress SARS-CoV-2/FLU/RSV assay is intended as an aid in  the diagnosis of influenza from Nasopharyngeal swab specimens and  should not be used as a sole basis for treatment. Nasal washings and  aspirates are unacceptable for Xpert Xpress SARS-CoV-2/FLU/RSV  testing. Fact Sheet for Patients: PinkCheek.be Fact Sheet for Healthcare Providers: GravelBags.it This test is not yet approved or cleared by the [place] FDA and  has been authorized for detection and/or diagnosis of SARS-CoV-2 by  FDA under an Emergency Use Authorization (EUA). This EUA will  remain  in effect (meaning this test can be used) for the duration of the  Covid-19 declaration under Section 564(b)(1) of the Act, 21  U.S.C. section 360bbb-3(b)(1), unless the authorization is  terminated or revoked. Performed at [facility], [address] [place], [place] [address]   Blood culture (routine x 2)     Status: None   Collection Time: 07/02/19  6:16 PM   Specimen: BLOOD  Result Value Ref Range Status   Specimen Description BLOOD LEFT ANTECUBITAL  Final   Special Requests   Final    BOTTLES DRAWN AEROBIC AND ANAEROBIC Blood Culture adequate volume   Culture   Final    NO GROWTH 5 DAYS Performed at [facility], [address] [place], [place] [address]    Report Status 07/07/2019 FINAL  Final  MRSA PCR Screening     Status: Abnormal   Collection Time: 07/03/19  1:29 AM   Specimen: Nasopharyngeal  Result Value Ref Range Status   MRSA by PCR POSITIVE (A) NEGATIVE Final    Comment:        The GeneXpert MRSA Assay (FDA approved for NASAL specimens only), is one component of a comprehensive MRSA colonization surveillance program. It is not intended to diagnose MRSA infection nor to guide or monitor treatment for MRSA infections. RESULT CALLED TO, READ BACK BY  AND VERIFIED WITH: Wendie Simmer RN [phone] 07/03/19 HNM Performed at [facility], [address], [place], [place] [address]   Aerobic/Anaerobic Culture (surgical/deep wound)     Status: None   Collection Time: 07/03/19 12:33 PM   Specimen: PATH Other; Tissue  Result Value Ref Range Status   Specimen Description   Final    ULCER SACRAL ULCER Performed at [facility], [address], [place], [place] [address]    Special Requests   Final    NONE Performed at [facility], [address] [place], [place] [address]    Gram Stain   Final    NO WBC SEEN ABUNDANT GRAM POSITIVE COCCI ABUNDANT GRAM NEGATIVE RODS ABUNDANT GRAM POSITIVE RODS     Culture   Final    MODERATE DIPHTHEROIDS(CORYNEBACTERIUM SPECIES) Standardized susceptibility testing for this organism is not available. FEW BACTEROIDES CACCAE BETA LACTAMASE POSITIVE Performed at [facility], [place] [address] [place], [place] [address]    Report Status 07/07/2019 FINAL  Final    Today   Subjective    Delonta Yohannes today is awake and alert.  He states he is not very hungry.  He seems surprised that he was being transitioned to hospice house.  He states he will discuss it with his sons.  Does admit to being confused.    Objective   Blood pressure 116/60, pulse (!) 59, temperature (!) 97.4 F (36.3 C), temperature source Oral, resp. rate 18, height 5\' 11"  (1.803 m), weight 89.1 kg, SpO2 97 %.   Intake/Output Summary (Last 24 hours) at 07/11/2019 1038 Last data filed at 07/11/2019 0747 Gross per 24 hour  Intake 0 ml  Output 1776 ml  Net -1776 ml    Exam Awake Alert, Oriented x 1, patient appears comfortable but confused. RRR, Decreased air entry bilaterally, likely secondary decrease in story effort Abdomen is soft and nontender   Data Review   CBC w Diff:  Lab Results  Component Value Date   WBC 11.6 (H) 07/09/2019   HGB 8.1 (L) 07/09/2019   HCT 25.0 (L) 07/09/2019   PLT 177 07/09/2019   LYMPHOPCT 12 07/02/2019   MONOPCT 8 07/02/2019   EOSPCT 0 07/02/2019   BASOPCT 1 07/02/2019    CMP:  Lab Results  Component Value Date   NA 142 07/10/2019   K 3.7 07/10/2019   CL 116 (H) 07/10/2019   CO2 20 (L) 07/10/2019   BUN 47 (H) 07/10/2019   CREATININE 1.73 (H) 07/10/2019   PROT 6.6 07/02/2019   ALBUMIN 1.8 (L) 07/09/2019   BILITOT 0.7 07/02/2019   ALKPHOS 190 (H) 07/02/2019   AST 41 07/02/2019   ALT 37 07/02/2019  .   Total Time in preparing paper work, data evaluation and todays exam - 35 minutes  Vashti Hey M.D on 07/11/2019 at 10:38 AM  [facility]   Office  [phone]

## 2019-07-11 NOTE — Progress Notes (Signed)
D: Pt alert and oriented to self. Pt denies experiencing any pain at this time. Pt reports   A: Care facility received discharge and medication education/information. Pt belongings were gathered and taken with pt. Pt will be leaving with PIC and foley catheter.  R: Care facility verbalized understanding of discharge and medication education/information.  Pt escorted to care facility via EMS.  Edward Strong gave report to care facility and called to transport.

## 2019-07-11 NOTE — TOC Transition Note (Signed)
Transition of Care [facility]) - CM/SW Discharge Note   Patient Details  Name: Edward Strong MRN: [id number] Date of Birth: 10-Mar-1930  [facility] CM/SW Contact:  Elease Hashimoto, LCSW Phone Number: 07/11/2019, 9:57 AM   Clinical Narrative:   Pt has a bed at [facility]. [name] is meeting with them this am at 9:30 am. [facility] involved and setting up EMS and calling report. Pt and son's all on board and feel this is the best place for Dad. Packet placed in chart. Ready for transfer. MD in the process of completing the paperwork.    Final next level of care: [place] Barriers to Discharge: Barriers Resolved   Patient Goals and CMS Choice        Discharge Placement              Patient chooses bed at: Other - please specify in the comment section below:([facility]) Patient to be transferred to facility by: EMS Name of family member notified: [name] Patient and family notified of of transfer: 07/11/19  Discharge Plan and Services                                     Social Determinants of Health (SDOH) Interventions     Readmission Risk Interventions Readmission Risk Prevention Plan 06/16/2019  Transportation Screening Complete  PCP or Specialist Appt within 3-5 Days (No Data)  [place] or [place] (No Data)  Palliative Care Screening Not Applicable  Medication Review (RN Care Manager) Complete  Some recent data might be hidden

## 2019-07-11 NOTE — Progress Notes (Signed)
Daily Progress Note   Patient Name: Edward Strong       Date: 07/11/2019 DOB: [birth year]  Age: 86 y.o. MRN#: [id number] Attending Physician: Oren Binet* Primary Care Physician: Toni Arthurs, NP Admit Date: 07/02/2019  Reason for Consultation/Follow-up: Establishing goals of care  Subjective: Patient is resting in bed. No family at bedside. He denies complaint and has no questions.  Length of Stay: 9  Current Medications: Scheduled Meds:  . Chlorhexidine Gluconate Cloth  6 each Topical Daily  . mupirocin ointment   Nasal BID  . pantoprazole  40 mg Oral QHS  . Ensure Max Protein  11 oz Oral BID BM  . senna  2 tablet Oral QHS  . sodium chloride flush  10-40 mL Intracatheter Q12H  . sodium chloride flush  3 mL Intravenous Q12H    Continuous Infusions:   PRN Meds: acetaminophen **OR** acetaminophen, albuterol, antiseptic oral rinse, glycopyrrolate **OR** glycopyrrolate **OR** glycopyrrolate, haloperidol **OR** haloperidol **OR** haloperidol lactate, HYDROcodone-acetaminophen, LORazepam **OR** LORazepam **OR** LORazepam, morphine injection, ondansetron **OR** ondansetron (ZOFRAN) IV, polyvinyl alcohol, sodium chloride flush, sodium chloride flush  Physical Exam HENT:     Head: Normocephalic and atraumatic.  Pulmonary:     Effort: Pulmonary effort is normal.  Skin:    General: Skin is warm and dry.  Neurological:     Mental Status: He is alert.             Vital Signs: BP 116/60 (BP Location: Right Arm)   Pulse (!) 59   Temp (!) 97.4 F (36.3 C) (Oral)   Resp 18   Ht 5\' 11"  (1.803 m)   Wt 89.1 kg   SpO2 97%   BMI 27.40 kg/m  SpO2: SpO2: 97 % O2 Device: O2 Device: Nasal Cannula O2 Flow Rate: O2 Flow Rate (L/min): 2 L/min  Intake/output summary:   Intake/Output  Summary (Last 24 hours) at 07/11/2019 1205 Last data filed at 07/11/2019 0747 Gross per 24 hour  Intake 0 ml  Output 1776 ml  Net -1776 ml   LBM: Last BM Date: 07/10/19 Baseline Weight: Weight: 104.3 kg Most recent weight: Weight: 89.1 kg       Palliative Assessment/Data: PPS 40%    Flowsheet Rows     Most Recent Value  Intake Tab  Referral Department  Critical care  Unit at Time of Referral  ICU  Palliative Care Primary Diagnosis  Sepsis/Infectious Disease  Date Notified  07/03/19  Palliative Care Type  New Palliative care  Reason for referral  Clarify Goals of Care  Date of Admission  07/02/19  Date first seen by Palliative Care  07/03/19  # of days Palliative referral response time  0 Day(s)  # of days IP prior to Palliative referral  1  Clinical Assessment  Palliative Performance Scale Score  40%  Psychosocial & Spiritual Assessment  Palliative Care Outcomes  Patient/Family meeting held?  Yes  Who was at the meeting?  son and DIL  Palliative Care Outcomes  Clarified goals of care, Provided psychosocial or spiritual support      Patient Active Problem List   Diagnosis Date Noted  . Malnutrition of moderate degree 07/08/2019  . Sepsis ([place]) 07/03/2019  .  Sacral decubitus ulcer, stage IV ([place])   . Goals of care, counseling/discussion   . Palliative care by specialist   . Sacral ulcer ([place]) 07/02/2019  . Acute urinary retention 06/12/2019  . BPH with obstruction/lower urinary tract symptoms 06/12/2019  . UTI (urinary tract infection) 06/12/2019  . Hyperglycemia due to type 2 diabetes mellitus ([place]) 06/11/2019  . Metabolic acidosis 00/93/8182  . S/P ORIF (open reduction internal fixation) fracture 06/11/2019  . Hypotension 06/11/2019  . Acute metabolic encephalopathy 99/37/1696  . AKI (acute kidney injury) ([place]) 06/11/2019  . Postoperative anemia due to acute blood loss 05/16/2019  . Acute kidney injury superimposed on chronic kidney disease ([place])   . Closed  fracture of left distal femur ([place]) 05/12/2019  . Chronic systolic CHF (congestive heart failure) ([place]) 05/12/2019  . Leukocytosis 05/12/2019  . Type II diabetes mellitus with renal manifestations ([place])   . HTN (hypertension)   . CKD (chronic kidney disease), stage IIIa   . Atrial fibrillation [facility])     Palliative Care Assessment & Plan   HPI: 86 y.o. male  with past medical history of T2DM, CKD 3, a fib on eliquis, CAD, systolic heart failure, Hodgkins lymphoma, and urinary retention admitted on 07/02/2019 with sacral ulcer and concern for osteomyelitis. He was evaluated by wound care on day of admission and found to have grossly necrotic stage 4 wound over his sacrum. He was hospitalized 2/3-2/8 for AKI. Also had femur fracture Jan 2021 that required ORIF. PMT consulted for [place].   Recommendations/Plan: Shift to comfort care. MOST form in place.    Code Status:  DNR  Prognosis:  < 2 weeks: poor prognosis r/t septic shock, osteo, poor baseline functional status, poor PO intake with albumin of 2.2 and acute on CKD. Per patient and family, stopping abx, stopping insulin and blood thinners, and any other medication not with a focus on comfort.  Discharge Planning:  To Be Determined  Care plan was discussed with RN. IM sent to primary MD.  Thank you for allowing the Palliative Medicine Team to assist in the care of this patient.   Total Time   15 min Prolonged Time Billed  no      Greater than 50%  of this time was spent counseling and coordinating care related to the above assessment and plan.

## 2019-07-11 NOTE — Progress Notes (Signed)
Follow up visit made to new referral for [facility]. Report called to the hospice home, discharge summary faxed, EMS notified for transport. Hospital care team all aware. Family aware of planned discharge/transport. Signed out of facility DNR in place Thank you. Flo Shanks BSN, RN, [place] [phone]

## 2019-07-15 NOTE — Progress Notes (Signed)
KELE, BARTHELEMY ([id number]) Visit Report for 07/02/2019 Allergy List Details Patient Name: [name], [name] Date of Service: 07/02/2019 2:00 PM Medical Record Number: [id number] Patient Account Number: [id number] Date of Birth/Sex: 1929/10/12 (86 y.o. M) Treating RN: Montey Hora Primary Care Toa Mia: Toni Arthurs Other Clinician: Referring Strong Okray: Edward Strong Treating Edward Strong/Extender: Edward Strong in Treatment: 0 Allergies Active Allergies bee pollen ezetimibe fenofibrate NSAIDS (Non-Steroidal Anti-Inflammatory Drug) pravastatin Statins-Hmg-Coa Reductase Inhibitors Tricor Sulfa (Sulfonamide Antibiotics) Allergy Notes Electronic Signature(s) Signed: 07/02/2019 4:59:40 PM By: Montey Hora Entered By: Montey Hora on 07/02/2019 14:28:53 [name], [name] ([id number]) -------------------------------------------------------------------------------- Arrival Information Details Patient Name: Mario, Henok A. Date of Service: 07/02/2019 2:00 PM Medical Record Number: [id number] Patient Account Number: [id number] Date of Birth/Sex: 1930-04-20 (86 y.o. M) Treating RN: Cornell Barman Primary Care Mariyanna Mucha: Toni Arthurs Other Clinician: Referring Tosca Pletz: Edward Strong Treating Saniyyah Elster/Extender: Edward Strong in Treatment: 0 Visit Information Patient Arrived: Wheel Chair Arrival Time: 14:13 Accompanied By: caregiver Transfer Assistance: Manual Patient Identification Verified: Yes Secondary Verification Process Completed: Yes Electronic Signature(s) Signed: 07/02/2019 3:06:34 PM By: Lorine Bears RCP, RRT, CHT Entered By: Lorine Bears on 07/02/2019 14:13:38 [name], [name] ([id number]) -------------------------------------------------------------------------------- Clinic Level of Care Assessment Details Patient Name: [name], [name] Date of Service: 07/02/2019 2:00 PM Medical Record Number: [id number] Patient Account  Number: [id number] Date of Birth/Sex: 02-Oct-1929 (86 y.o. M) Treating RN: Army Melia Primary Care Lamyiah Crawshaw: Toni Arthurs Other Clinician: Referring Deanna Boehlke: Edward Strong Treating Yeiren Whitecotton/Extender: Edward Strong in Treatment: 0 Clinic Level of Care Assessment Items TOOL 2 Quantity Score []  - Use when only an EandM is performed on the INITIAL visit 0 ASSESSMENTS - Nursing Assessment / Reassessment X - General Physical Exam (combine w/ comprehensive assessment (listed just below) when performed on new pt. 1 20 evals) X- 1 25 Comprehensive Assessment (HX, ROS, Risk Assessments, Wounds Hx, etc.) ASSESSMENTS - Wound and Skin Assessment / Reassessment X - Simple Wound Assessment / Reassessment - one wound 1 5 []  - 0 Complex Wound Assessment / Reassessment - multiple wounds []  - 0 Dermatologic / Skin Assessment (not related to wound area) ASSESSMENTS - Ostomy and/or Continence Assessment and Care []  - Incontinence Assessment and Management 0 []  - 0 Ostomy Care Assessment and Management (repouching, etc.) PROCESS - Coordination of Care X - Simple Patient / Family Education for ongoing care 1 15 []  - 0 Complex (extensive) Patient / Family Education for ongoing care X- 1 10 Staff obtains Programmer, systems, Records, Test Results / Process Orders X- 1 10 Staff telephones HHA, Nursing Homes / Clarify orders / etc []  - 0 Routine Transfer to another Facility (non-emergent condition) X- 1 10 Routine Hospital Admission (non-emergent condition) X- 1 15 New Admissions / Biomedical engineer / Ordering NPWT, Apligraf, etc. []  - 0 Emergency Hospital Admission (emergent condition) []  - 0 Simple Discharge Coordination []  - 0 Complex (extensive) Discharge Coordination PROCESS - Special Needs []  - Pediatric / Minor Patient Management 0 []  - 0 Isolation Patient Management []  - 0 Hearing / Language / Visual special needs []  - 0 Assessment of Community assistance (transportation,  D/C planning, etc.) []  - 0 Additional assistance / Altered mentation []  - 0 Support Surface(s) Assessment (bed, cushion, seat, etc.) INTERVENTIONS - Wound Cleansing / Measurement X - Wound Imaging (photographs - any number of wounds) 1 5 [name], [name] ([id number]) []  - 0 Wound Tracing (instead of photographs) X- 1 5 Simple Wound Measurement - one wound []  - 0 Complex  Wound Measurement - multiple wounds X- 1 5 Simple Wound Cleansing - one wound []  - 0 Complex Wound Cleansing - multiple wounds INTERVENTIONS - Wound Dressings []  - Small Wound Dressing one or multiple wounds 0 X- 1 15 Medium Wound Dressing one or multiple wounds []  - 0 Large Wound Dressing one or multiple wounds []  - 0 Application of Medications - injection INTERVENTIONS - Miscellaneous []  - External ear exam 0 []  - 0 Specimen Collection (cultures, biopsies, blood, body fluids, etc.) []  - 0 Specimen(s) / Culture(s) sent or taken to Lab for analysis []  - 0 Patient Transfer (multiple staff / Civil Service fast streamer / Similar devices) []  - 0 Simple Staple / Suture removal (25 or less) []  - 0 Complex Staple / Suture removal (26 or more) []  - 0 Hypo / Hyperglycemic Management (close monitor of Blood Glucose) []  - 0 Ankle / Brachial Index (ABI) - do not check if billed separately Has the patient been seen at the hospital within the last three years: Yes Total Score: 140 Level Of Care: New/Established - Level 4 Notes Pt was seen and sent to ER to be admitted for surgical debridement. Wet to dry dressing applied to pt before departure Electronic Signature(s) Signed: 07/02/2019 4:52:55 PM By: Army Melia Entered By: Army Melia on 07/02/2019 16:51:08 Edward [place], Jameson. ([id number]) -------------------------------------------------------------------------------- Encounter Discharge Information Details Patient Name: [name], [name] Date of Service: 07/02/2019 2:00 PM Medical Record Number: [id number] Patient Account Number:  [id number] Date of Birth/Sex: 1929-08-27 (86 y.o. M) Treating RN: Army Melia Primary Care Calysta Craigo: Toni Arthurs Other Clinician: Referring Keara Pagliarulo: Edward Strong Treating Jeff Frieden/Extender: Edward Strong in Treatment: 0 Encounter Discharge Information Items Discharge Condition: Stable Ambulatory Status: Stretcher Discharge Destination: Emergency Room Telephoned: No Orders Sent: Yes Transportation: Ambulance Accompanied By: self Schedule Follow-up Appointment: Yes Clinical Summary of Care: Electronic Signature(s) Signed: 07/02/2019 4:52:46 PM By: Army Melia Entered By: Army Melia on 07/02/2019 16:52:46 Hickam, Dnaiel A. ([id number]) -------------------------------------------------------------------------------- Lower Extremity Assessment Details Patient Name: Stallman, Jagar A. Date of Service: 07/02/2019 2:00 PM Medical Record Number: [id number] Patient Account Number: [id number] Date of Birth/Sex: Sep 27, 1929 (86 y.o. M) Treating RN: Montey Hora Primary Care Oluwaseyi Tull: Toni Arthurs Other Clinician: Referring Morene Cecilio: Edward Strong Treating Alizabeth Antonio/Extender: Edward Strong in Treatment: 0 Electronic Signature(s) Signed: 07/02/2019 4:59:40 PM By: Montey Hora Entered By: Montey Hora on 07/02/2019 14:38:54 Depp, Hymie A. ([id number]) -------------------------------------------------------------------------------- Multi Wound Chart Details Patient Name: Wheeless, Trevelle A. Date of Service: 07/02/2019 2:00 PM Medical Record Number: [id number] Patient Account Number: [id number] Date of Birth/Sex: 1929/11/22 (86 y.o. M) Treating RN: Army Melia Primary Care Curry Seefeldt: Toni Arthurs Other Clinician: Referring Catie Chiao: Edward Strong Treating Aliceson Dolbow/Extender: Edward Strong in Treatment: 0 Vital Signs Height(in): 71 Pulse(bpm): 62 Weight(lbs): 200 Blood Pressure(mmHg): 90/58 Body Mass Index(BMI): 28 Temperature(F):  96.8 Respiratory Rate(breaths/min): 18 Photos: [N/A:N/A] Wound Location: Sacrum N/A N/A Wounding Event: Pressure Injury N/A N/A Primary Etiology: Pressure Ulcer N/A N/A Comorbid History: Arrhythmia, Congestive Heart N/A N/A Failure, Coronary Artery Disease, Type II Diabetes, End Stage Renal Disease, Received Chemotherapy Date Acquired: 05/13/2019 N/A N/A Weeks of Treatment: 0 N/A N/A Wound Status: Open N/A N/A Measurements L x W x D (cm) 9.5x10.2x3.8 N/A N/A Area (cm) : 76.105 N/A N/A Volume (cm) : 289.199 N/A N/A Starting Position 1 (o'clock): 10 Ending Position 1 (o'clock): 3 Maximum Distance 1 (cm): 4 Undermining: Yes N/A N/A Classification: Category/Stage IV N/A N/A Exudate Amount: Large N/A N/A Exudate Type: Purulent N/A N/A Exudate Color: yellow,  brown, green N/A N/A Foul Odor After Cleansing: Yes N/A N/A Odor Anticipated Due to Product No N/A N/A Use: Wound Margin: Flat and Intact N/A N/A Granulation Amount: None Present (0%) N/A N/A Necrotic Amount: Large (67-100%) N/A N/A Necrotic Tissue: Eschar, Adherent Slough N/A N/A Exposed Structures: Fat Layer (Subcutaneous Tissue) N/A N/A Exposed: Yes Muscle: Yes Fascia: No Tendon: No Joint: No Bone: No Epithelialization: None N/A N/A Treatment Notes ROLLIN, KOTOWSKI ([id number]) Electronic Signature(s) Signed: 07/02/2019 5:01:35 PM By: Linton Ham MD Entered By: Linton Ham on 07/02/2019 15:46:30 Wilbon, Sloan A. ([id number]) -------------------------------------------------------------------------------- [place] Details Patient Name: Frenkel, Kaydin A. Date of Service: 07/02/2019 2:00 PM Medical Record Number: [id number] Patient Account Number: [id number] Date of Birth/Sex: 1929-05-15 (86 y.o. M) Treating RN: Army Melia Primary Care Raheel Kunkle: Toni Arthurs Other Clinician: Referring Rohit Deloria: Edward Strong Treating Tieara Flitton/Extender: Edward Strong in Treatment: 0 Active  Inactive Electronic Signature(s) Signed: 07/10/2019 10:50:49 AM By: Gretta Cool, BSN, RN, CWS, Kim RN, BSN Signed: 07/15/2019 10:43:26 AM By: Army Melia Previous Signature: 07/02/2019 4:52:55 PM Version By: Army Melia Entered By: Gretta Cool BSN, RN, CWS, Kim on 07/10/2019 10:50:49 Cardiff, Latavious A. ([id number]) -------------------------------------------------------------------------------- Pain Assessment Details Patient Name: Stanard, Pavan A. Date of Service: 07/02/2019 2:00 PM Medical Record Number: [id number] Patient Account Number: [id number] Date of Birth/Sex: 11/05/1929 (86 y.o. M) Treating RN: Cornell Barman Primary Care Jantz Main: Toni Arthurs Other Clinician: Referring Mahli Glahn: Edward Strong Treating Ahnya Akre/Extender: Edward Strong in Treatment: 0 Active Problems Location of Pain Severity and Description of Pain Patient Has Paino No Site Locations Pain Management and Medication Current Pain Management: Electronic Signature(s) Signed: 07/02/2019 3:06:34 PM By: Paulla Fore, RRT, CHT Signed: 07/11/2019 5:40:44 PM By: Gretta Cool, BSN, RN, CWS, Kim RN, BSN Entered By: Lorine Bears on 07/02/2019 14:13:49 Stump, Darliss Cheney ([id number]) -------------------------------------------------------------------------------- Patient/Caregiver Education Details Patient Name: Medina, Carden A. Date of Service: 07/02/2019 2:00 PM Medical Record Number: [id number] Patient Account Number: [id number] Date of Birth/Gender: [birth year] (86 y.o. M) Treating RN: Army Melia Primary Care Physician: Toni Arthurs Other Clinician: Referring Physician: Lovie Macadamia, Strong Treating Physician/Extender: Edward Strong in Treatment: 0 Education Assessment Education Provided To: Patient Education Topics Provided Pressure: Handouts: Pressure Ulcers: Care and Offloading Methods: Demonstration, Explain/Verbal Responses: State content correctly Wound/Skin  Impairment: Handouts: Caring for Your Ulcer Methods: Demonstration, Explain/Verbal Responses: State content correctly Electronic Signature(s) Signed: 07/02/2019 4:52:55 PM By: Army Melia Entered By: Army Melia on 07/02/2019 16:51:46 Boike, Holly A. ([id number]) -------------------------------------------------------------------------------- Wound Assessment Details Patient Name: Poplar, Cashus A. Date of Service: 07/02/2019 2:00 PM Medical Record Number: [id number] Patient Account Number: [id number] Date of Birth/Sex: Oct 28, 1929 (86 y.o. M) Treating RN: Montey Hora Primary Care Mayola Mcbain: Toni Arthurs Other Clinician: Referring Teyonna Plaisted: Edward Strong Treating Blakeley Margraf/Extender: Edward Strong in Treatment: 0 Wound Status Wound Number: 1 Primary Pressure Ulcer Etiology: Wound Location: Sacrum Wound Open Wounding Event: Pressure Injury Status: Date Acquired: 05/13/2019 Comorbid Arrhythmia, Congestive Heart Failure, Coronary Artery Weeks Of Treatment: 0 History: Disease, Type II Diabetes, End Stage Renal Disease, Clustered Wound: No Received Chemotherapy Photos Wound Measurements Length: (cm) 9.5 % Re Width: (cm) 10.2 % Re Depth: (cm) 3.8 Epit Area: (cm) 76.105 Tun Volume: (cm) 289.199 Und S E M duction in Area: duction in Volume: helialization: None neling: No ermining: Yes tarting Position (o'clock): 10 nding Position (o'clock): 3 aximum Distance: (cm) 4 Wound Description Classification: Category/Stage IV Foul Wound Margin: Flat and Intact Due Exudate Amount: Large Slou Exudate Type: Purulent Exudate Color:  yellow, brown, green Odor After Cleansing: Yes to Product Use: No gh/Fibrino Yes Wound Bed Granulation Amount: None Present (0%) Exposed Structure Necrotic Amount: Large (67-100%) Fascia Exposed: No Necrotic Quality: Eschar, Adherent Slough Fat Layer (Subcutaneous Tissue) Exposed: Yes Tendon Exposed: No Muscle Exposed: Yes Necrosis of  Muscle: Yes Joint Exposed: No Bone Exposed: No Electronic Signature(s) Signed: 07/02/2019 4:59:40 PM By: Cathrine Muster, Fordyce A. ([id number]) Entered By: Montey Hora on 07/02/2019 14:49:35 Jablonski, Anastacio A. ([id number]) -------------------------------------------------------------------------------- Vitals Details Patient Name: Oser, Andrus A. Date of Service: 07/02/2019 2:00 PM Medical Record Number: [id number] Patient Account Number: [id number] Date of Birth/Sex: 10-03-1929 (86 y.o. M) Treating RN: Cornell Barman Primary Care Calina Patrie: Toni Arthurs Other Clinician: Referring Welby Montminy: Edward Strong Treating Alzena Gerber/Extender: Edward Strong in Treatment: 0 Vital Signs Time Taken: 14:10 Temperature (F): 96.8 Height (in): 71 Pulse (bpm): 62 Source: Stated Respiratory Rate (breaths/min): 18 Weight (lbs): 200 Blood Pressure (mmHg): 90/58 Source: Stated Reference Range: 80 - 120 mg / dl Body Mass Index (BMI): 27.9 Electronic Signature(s) Signed: 07/02/2019 3:06:34 PM By: Lorine Bears RCP, RRT, CHT Entered By: Lorine Bears on 07/02/2019 14:19:17

## 2019-08-07 DEATH — deceased

## 2021-11-22 IMAGING — DX DG KNEE COMPLETE 4+V*L*
5 series · 5 of 5 positions shown · non-contrast
Comparison: None.

CLINICAL DATA: Left knee pain after falling at home today.

EXAM:
LEFT KNEE - COMPLETE 4+ VIEW

[knee ap (1 of 2)]
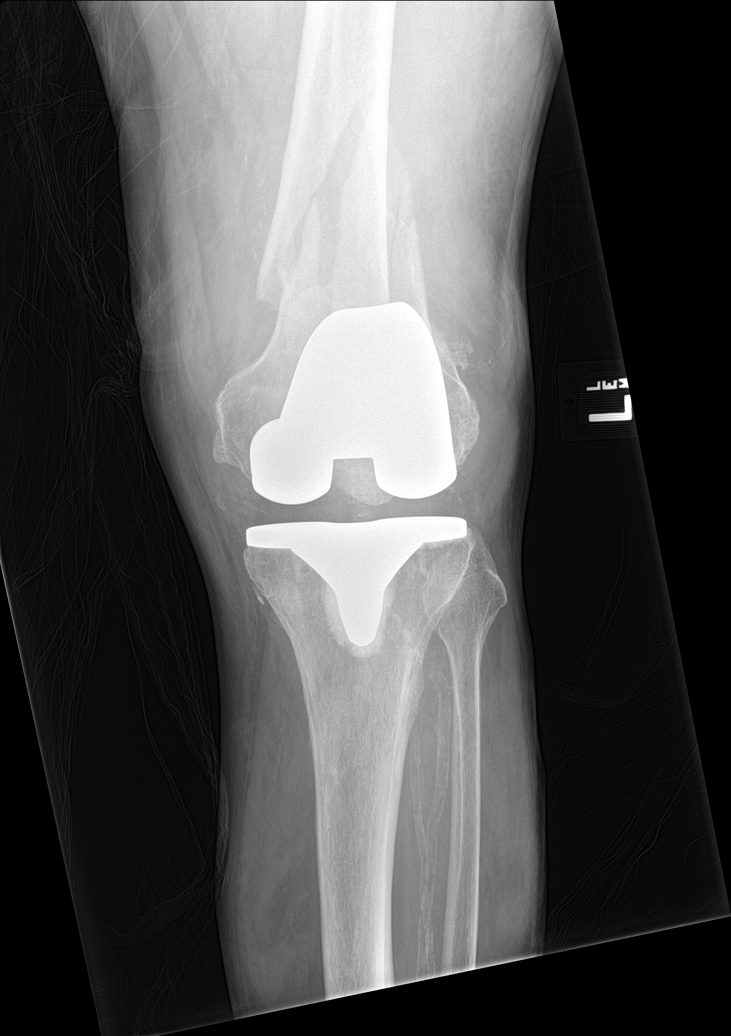

[knee lat]
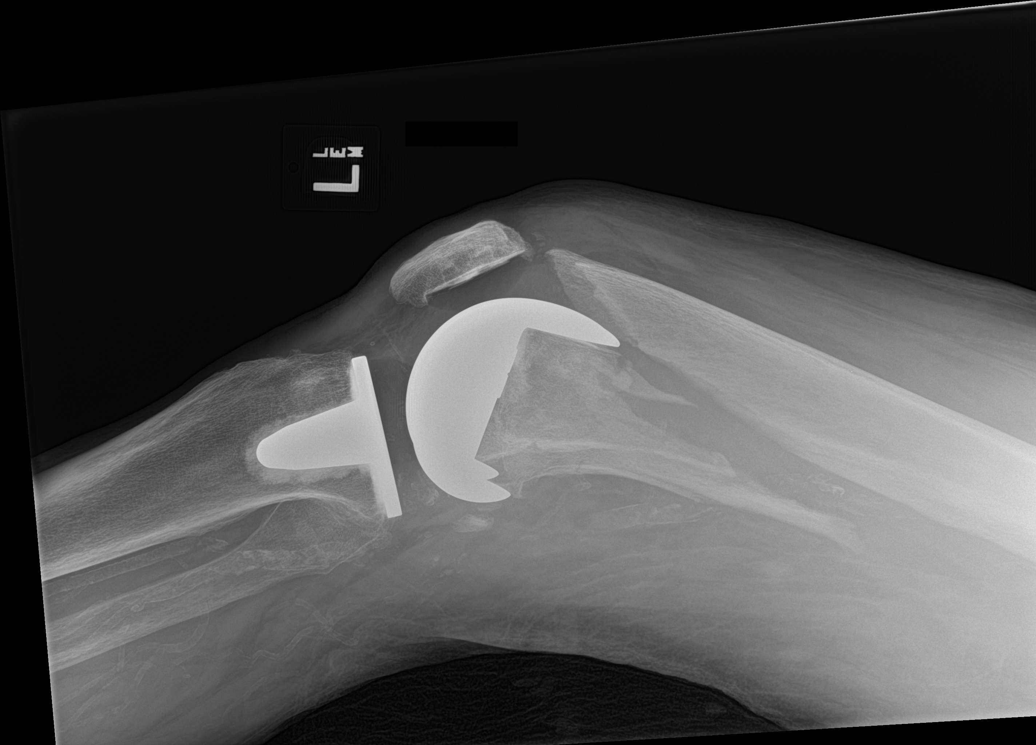

[knee obl (1 of 2)]
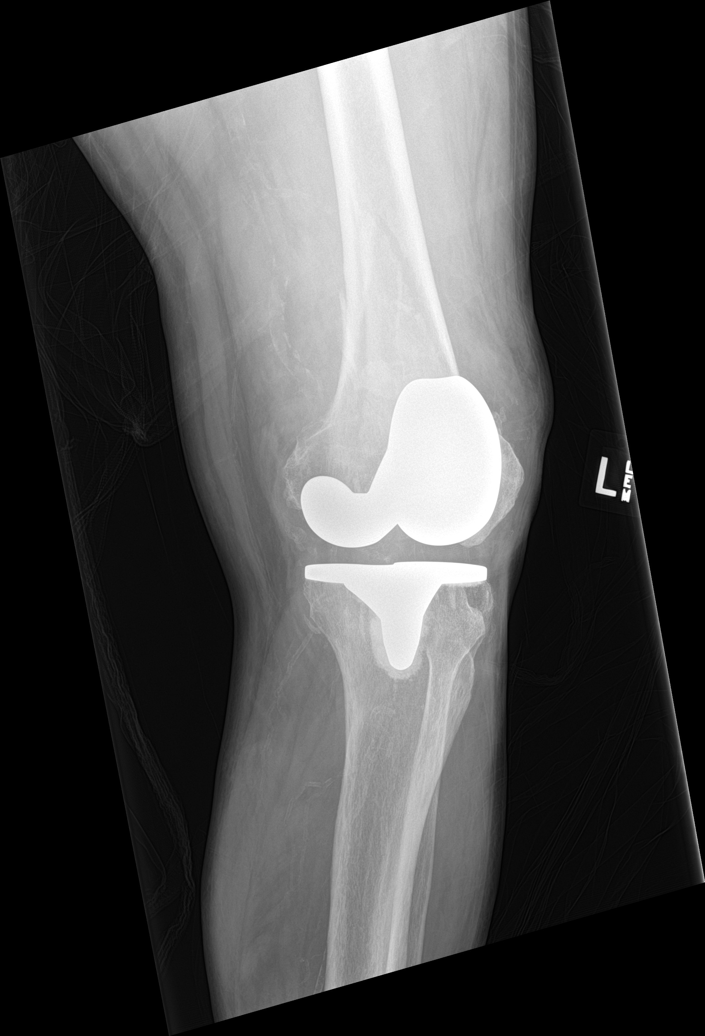

[knee obl (2 of 2)]
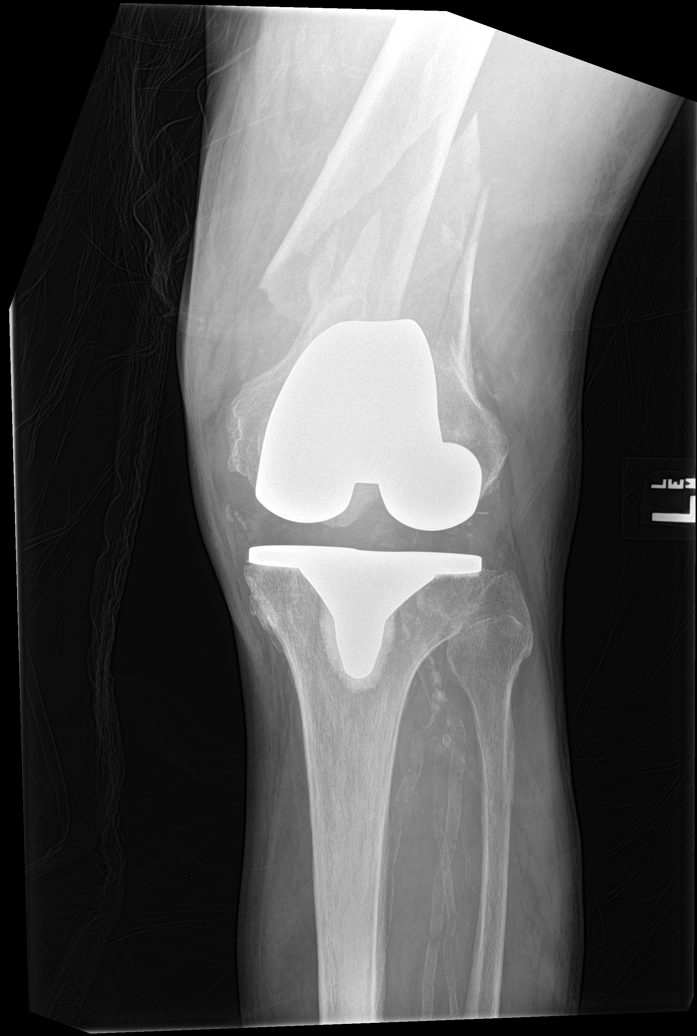

[knee ap (2 of 2)]
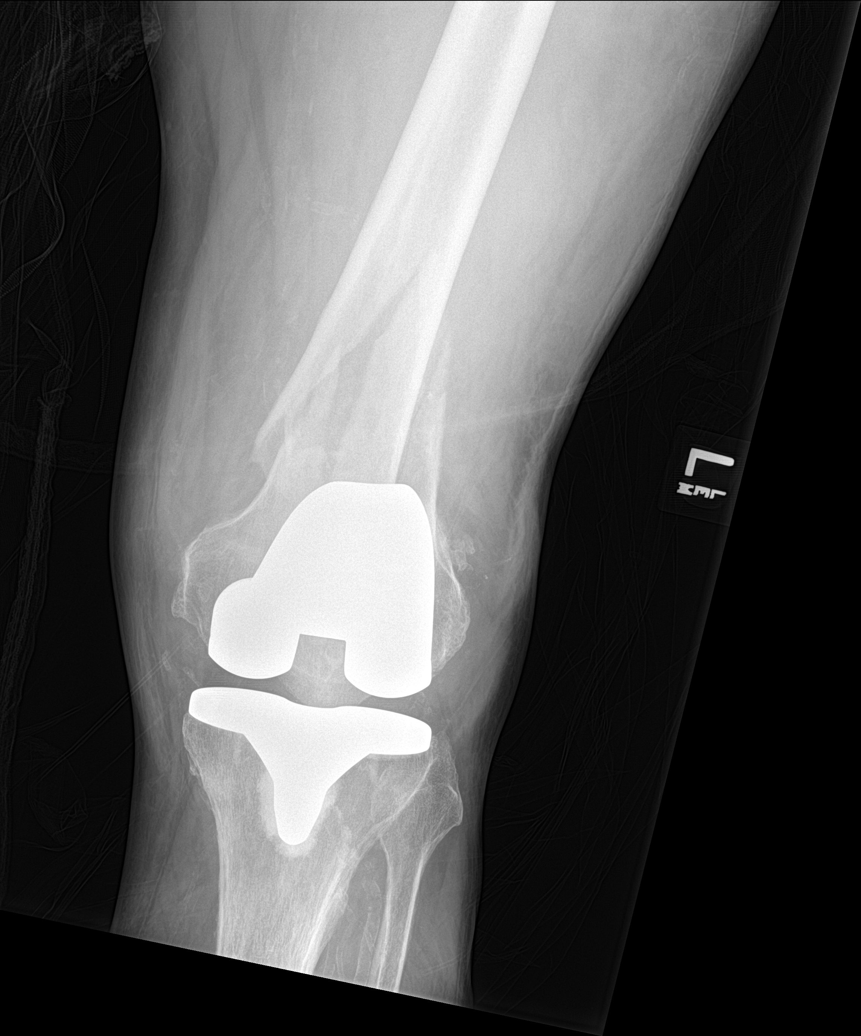

[5 of 5 positions shown; findings below may reference images not displayed]

FINDINGS: Status post total knee arthroplasty. There is a comminuted and
moderately displaced fracture of the distal femur just proximal to
the femoral component of the arthroplasty. This fracture
demonstrates up to 2.1 cm of posterior and 4.3 cm of proximal
displacement. No dislocation. The proximal tibia and fibula are
intact. Diffuse vascular calcifications are noted.
IMPRESSION: Comminuted and moderately displaced fracture of the distal femur
just proximal to the femoral component of the total knee
arthroplasty.
